# Patient Record
Sex: Male | Born: 1965
Health system: Southern US, Community
[De-identification: ages and names within clinical notes are randomized; demographics above are authoritative.]

## PROBLEM LIST (undated history)

## (undated) DIAGNOSIS — I700 Atherosclerosis of aorta: Secondary | ICD-10-CM

## (undated) DIAGNOSIS — J449 Chronic obstructive pulmonary disease, unspecified: Secondary | ICD-10-CM

## (undated) DIAGNOSIS — F411 Generalized anxiety disorder: Secondary | ICD-10-CM

## (undated) DIAGNOSIS — I2510 Atherosclerotic heart disease of native coronary artery without angina pectoris: Secondary | ICD-10-CM

## (undated) DIAGNOSIS — I209 Angina pectoris, unspecified: Secondary | ICD-10-CM

## (undated) DIAGNOSIS — I219 Acute myocardial infarction, unspecified: Secondary | ICD-10-CM

## (undated) DIAGNOSIS — E782 Mixed hyperlipidemia: Secondary | ICD-10-CM

## (undated) DIAGNOSIS — I10 Essential (primary) hypertension: Secondary | ICD-10-CM

## (undated) DIAGNOSIS — K219 Gastro-esophageal reflux disease without esophagitis: Secondary | ICD-10-CM

## (undated) DIAGNOSIS — Z720 Tobacco use: Secondary | ICD-10-CM

## (undated) DIAGNOSIS — I214 Non-ST elevation (NSTEMI) myocardial infarction: Secondary | ICD-10-CM

## (undated) HISTORY — DX: Tobacco use: Z72.0

## (undated) HISTORY — DX: Atherosclerosis of aorta: I70.0

## (undated) HISTORY — PX: HERNIA REPAIR: SHX51

## (undated) HISTORY — DX: Generalized anxiety disorder: F41.1

## (undated) HISTORY — DX: Mixed hyperlipidemia: E78.2

## (undated) HISTORY — DX: Non-ST elevation (NSTEMI) myocardial infarction: I21.4

---

## 2016-11-16 DIAGNOSIS — R0902 Hypoxemia: Secondary | ICD-10-CM | POA: Diagnosis not present

## 2016-12-17 DIAGNOSIS — R0902 Hypoxemia: Secondary | ICD-10-CM | POA: Diagnosis not present

## 2017-01-14 DIAGNOSIS — R0902 Hypoxemia: Secondary | ICD-10-CM | POA: Diagnosis not present

## 2017-02-14 DIAGNOSIS — R0902 Hypoxemia: Secondary | ICD-10-CM | POA: Diagnosis not present

## 2017-03-16 DIAGNOSIS — R0902 Hypoxemia: Secondary | ICD-10-CM | POA: Diagnosis not present

## 2017-04-16 DIAGNOSIS — R0902 Hypoxemia: Secondary | ICD-10-CM | POA: Diagnosis not present

## 2017-05-16 DIAGNOSIS — R0902 Hypoxemia: Secondary | ICD-10-CM | POA: Diagnosis not present

## 2017-06-16 DIAGNOSIS — R0902 Hypoxemia: Secondary | ICD-10-CM | POA: Diagnosis not present

## 2017-07-17 DIAGNOSIS — R0902 Hypoxemia: Secondary | ICD-10-CM | POA: Diagnosis not present

## 2017-08-16 DIAGNOSIS — R0902 Hypoxemia: Secondary | ICD-10-CM | POA: Diagnosis not present

## 2017-08-18 DIAGNOSIS — Z23 Encounter for immunization: Secondary | ICD-10-CM | POA: Diagnosis not present

## 2017-08-18 DIAGNOSIS — Z0000 Encounter for general adult medical examination without abnormal findings: Secondary | ICD-10-CM | POA: Diagnosis not present

## 2017-09-16 DIAGNOSIS — R0902 Hypoxemia: Secondary | ICD-10-CM | POA: Diagnosis not present

## 2017-10-16 DIAGNOSIS — R0902 Hypoxemia: Secondary | ICD-10-CM | POA: Diagnosis not present

## 2017-11-16 DIAGNOSIS — R0902 Hypoxemia: Secondary | ICD-10-CM | POA: Diagnosis not present

## 2017-12-16 DIAGNOSIS — J441 Chronic obstructive pulmonary disease with (acute) exacerbation: Secondary | ICD-10-CM | POA: Diagnosis not present

## 2017-12-17 DIAGNOSIS — R0902 Hypoxemia: Secondary | ICD-10-CM | POA: Diagnosis not present

## 2018-01-14 DIAGNOSIS — R0902 Hypoxemia: Secondary | ICD-10-CM | POA: Diagnosis not present

## 2018-02-14 DIAGNOSIS — R0902 Hypoxemia: Secondary | ICD-10-CM | POA: Diagnosis not present

## 2018-03-16 DIAGNOSIS — R0902 Hypoxemia: Secondary | ICD-10-CM | POA: Diagnosis not present

## 2018-04-16 DIAGNOSIS — R0902 Hypoxemia: Secondary | ICD-10-CM | POA: Diagnosis not present

## 2018-05-16 DIAGNOSIS — R0902 Hypoxemia: Secondary | ICD-10-CM | POA: Diagnosis not present

## 2018-06-16 DIAGNOSIS — R0902 Hypoxemia: Secondary | ICD-10-CM | POA: Diagnosis not present

## 2018-07-11 DIAGNOSIS — Z125 Encounter for screening for malignant neoplasm of prostate: Secondary | ICD-10-CM | POA: Diagnosis not present

## 2018-07-11 DIAGNOSIS — Z6832 Body mass index (BMI) 32.0-32.9, adult: Secondary | ICD-10-CM | POA: Diagnosis not present

## 2018-07-11 DIAGNOSIS — Z0000 Encounter for general adult medical examination without abnormal findings: Secondary | ICD-10-CM | POA: Diagnosis not present

## 2018-07-17 DIAGNOSIS — R0902 Hypoxemia: Secondary | ICD-10-CM | POA: Diagnosis not present

## 2018-08-16 DIAGNOSIS — R0902 Hypoxemia: Secondary | ICD-10-CM | POA: Diagnosis not present

## 2018-09-16 DIAGNOSIS — R0902 Hypoxemia: Secondary | ICD-10-CM | POA: Diagnosis not present

## 2018-10-16 DIAGNOSIS — R0902 Hypoxemia: Secondary | ICD-10-CM | POA: Diagnosis not present

## 2018-11-04 DIAGNOSIS — J441 Chronic obstructive pulmonary disease with (acute) exacerbation: Secondary | ICD-10-CM | POA: Diagnosis not present

## 2018-11-16 DIAGNOSIS — R0902 Hypoxemia: Secondary | ICD-10-CM | POA: Diagnosis not present

## 2018-12-17 DIAGNOSIS — R0902 Hypoxemia: Secondary | ICD-10-CM | POA: Diagnosis not present

## 2019-01-15 DIAGNOSIS — R0902 Hypoxemia: Secondary | ICD-10-CM | POA: Diagnosis not present

## 2019-02-15 DIAGNOSIS — R0902 Hypoxemia: Secondary | ICD-10-CM | POA: Diagnosis not present

## 2019-03-17 DIAGNOSIS — R0902 Hypoxemia: Secondary | ICD-10-CM | POA: Diagnosis not present

## 2020-02-13 ENCOUNTER — Other Ambulatory Visit: Payer: Self-pay

## 2020-02-13 MED ORDER — MONTELUKAST SODIUM 10 MG PO TABS: 10.0000 mg | ORAL_TABLET | Freq: Every day | ORAL | 0 refills | Status: DC

## 2020-02-28 ENCOUNTER — Inpatient Hospital Stay (HOSPITAL_COMMUNITY)
Admission: AD | Admit: 2020-02-28 | Discharge: 2020-03-14 | DRG: 233 | Disposition: A | Discharge status: Home / Self Care | Payer: BC Managed Care – PPO | Source: Other Acute Inpatient Hospital | Attending: Thoracic Surgery (Cardiothoracic Vascular Surgery) | Admitting: Thoracic Surgery (Cardiothoracic Vascular Surgery)

## 2020-02-28 ENCOUNTER — Other Ambulatory Visit: Payer: Self-pay

## 2020-02-28 ENCOUNTER — Inpatient Hospital Stay (HOSPITAL_COMMUNITY): Payer: BC Managed Care – PPO

## 2020-02-28 ENCOUNTER — Encounter (HOSPITAL_COMMUNITY): Payer: Self-pay | Admitting: Internal Medicine

## 2020-02-28 DIAGNOSIS — Z79899 Other long term (current) drug therapy: Secondary | ICD-10-CM

## 2020-02-28 DIAGNOSIS — Z09 Encounter for follow-up examination after completed treatment for conditions other than malignant neoplasm: Secondary | ICD-10-CM

## 2020-02-28 DIAGNOSIS — E8770 Fluid overload, unspecified: Secondary | ICD-10-CM | POA: Diagnosis not present

## 2020-02-28 DIAGNOSIS — Z01810 Encounter for preprocedural cardiovascular examination: Secondary | ICD-10-CM

## 2020-02-28 DIAGNOSIS — J939 Pneumothorax, unspecified: Secondary | ICD-10-CM

## 2020-02-28 DIAGNOSIS — Y838 Other surgical procedures as the cause of abnormal reaction of the patient, or of later complication, without mention of misadventure at the time of the procedure: Secondary | ICD-10-CM | POA: Diagnosis not present

## 2020-02-28 DIAGNOSIS — Z951 Presence of aortocoronary bypass graft: Secondary | ICD-10-CM

## 2020-02-28 DIAGNOSIS — Z720 Tobacco use: Secondary | ICD-10-CM | POA: Diagnosis not present

## 2020-02-28 DIAGNOSIS — J95812 Postprocedural air leak: Secondary | ICD-10-CM | POA: Diagnosis not present

## 2020-02-28 DIAGNOSIS — J9382 Other air leak: Secondary | ICD-10-CM | POA: Diagnosis not present

## 2020-02-28 DIAGNOSIS — J9811 Atelectasis: Secondary | ICD-10-CM | POA: Diagnosis not present

## 2020-02-28 DIAGNOSIS — T380X5A Adverse effect of glucocorticoids and synthetic analogues, initial encounter: Secondary | ICD-10-CM | POA: Diagnosis not present

## 2020-02-28 DIAGNOSIS — J439 Emphysema, unspecified: Secondary | ICD-10-CM | POA: Diagnosis present

## 2020-02-28 DIAGNOSIS — I214 Non-ST elevation (NSTEMI) myocardial infarction: Secondary | ICD-10-CM | POA: Diagnosis not present

## 2020-02-28 DIAGNOSIS — Z20822 Contact with and (suspected) exposure to covid-19: Secondary | ICD-10-CM | POA: Diagnosis present

## 2020-02-28 DIAGNOSIS — J9620 Acute and chronic respiratory failure, unspecified whether with hypoxia or hypercapnia: Secondary | ICD-10-CM | POA: Diagnosis not present

## 2020-02-28 DIAGNOSIS — I2510 Atherosclerotic heart disease of native coronary artery without angina pectoris: Secondary | ICD-10-CM | POA: Diagnosis present

## 2020-02-28 DIAGNOSIS — Z8249 Family history of ischemic heart disease and other diseases of the circulatory system: Secondary | ICD-10-CM

## 2020-02-28 DIAGNOSIS — K219 Gastro-esophageal reflux disease without esophagitis: Secondary | ICD-10-CM | POA: Diagnosis present

## 2020-02-28 DIAGNOSIS — I25110 Atherosclerotic heart disease of native coronary artery with unstable angina pectoris: Secondary | ICD-10-CM | POA: Diagnosis not present

## 2020-02-28 DIAGNOSIS — R079 Chest pain, unspecified: Secondary | ICD-10-CM | POA: Diagnosis present

## 2020-02-28 DIAGNOSIS — D72829 Elevated white blood cell count, unspecified: Secondary | ICD-10-CM | POA: Diagnosis not present

## 2020-02-28 DIAGNOSIS — F17200 Nicotine dependence, unspecified, uncomplicated: Secondary | ICD-10-CM | POA: Diagnosis present

## 2020-02-28 DIAGNOSIS — E781 Pure hyperglyceridemia: Secondary | ICD-10-CM | POA: Diagnosis present

## 2020-02-28 DIAGNOSIS — Z9689 Presence of other specified functional implants: Secondary | ICD-10-CM

## 2020-02-28 HISTORY — DX: Chest pain, unspecified: R07.9

## 2020-02-28 HISTORY — DX: Gastro-esophageal reflux disease without esophagitis: K21.9

## 2020-02-28 LAB — CBC WITH DIFFERENTIAL/PLATELET
Abs Immature Granulocytes: 0.06 10*3/uL (ref 0.00–0.07)
Basophils Absolute: 0.1 10*3/uL (ref 0.0–0.1)
Basophils Relative: 1 %
Eosinophils Absolute: 0.2 10*3/uL (ref 0.0–0.5)
Eosinophils Relative: 2 %
HCT: 49.5 % (ref 39.0–52.0)
Hemoglobin: 17.4 g/dL — ABNORMAL HIGH (ref 13.0–17.0)
Immature Granulocytes: 1 %
Lymphocytes Relative: 37 %
Lymphs Abs: 4.3 10*3/uL — ABNORMAL HIGH (ref 0.7–4.0)
MCH: 32.2 pg (ref 26.0–34.0)
MCHC: 35.2 g/dL (ref 30.0–36.0)
MCV: 91.5 fL (ref 80.0–100.0)
Monocytes Absolute: 0.8 10*3/uL (ref 0.1–1.0)
Monocytes Relative: 7 %
Neutro Abs: 6.2 10*3/uL (ref 1.7–7.7)
Neutrophils Relative %: 52 %
Platelets: 221 10*3/uL (ref 150–400)
RBC: 5.41 MIL/uL (ref 4.22–5.81)
RDW: 13.6 % (ref 11.5–15.5)
WBC: 11.6 10*3/uL — ABNORMAL HIGH (ref 4.0–10.5)
nRBC: 0 % (ref 0.0–0.2)

## 2020-02-28 LAB — COMPREHENSIVE METABOLIC PANEL
ALT: 47 U/L — ABNORMAL HIGH (ref 0–44)
AST: 46 U/L — ABNORMAL HIGH (ref 15–41)
Albumin: 3.7 g/dL (ref 3.5–5.0)
Alkaline Phosphatase: 67 U/L (ref 38–126)
Anion gap: 13 (ref 5–15)
BUN: 12 mg/dL (ref 6–20)
CO2: 21 mmol/L — ABNORMAL LOW (ref 22–32)
Calcium: 9.3 mg/dL (ref 8.9–10.3)
Chloride: 104 mmol/L (ref 98–111)
Creatinine, Ser: 0.92 mg/dL (ref 0.61–1.24)
GFR calc Af Amer: >60 mL/min (ref >60)
GFR calc non Af Amer: >60 mL/min (ref >60)
Glucose, Bld: 103 mg/dL — ABNORMAL HIGH (ref 70–99)
Potassium: 4.3 mmol/L (ref 3.5–5.1)
Sodium: 138 mmol/L (ref 135–145)
Total Bilirubin: 0.5 mg/dL (ref 0.3–1.2)
Total Protein: 7.3 g/dL (ref 6.5–8.1)

## 2020-02-28 LAB — LIPASE, BLOOD: Lipase: 27 U/L (ref 11–51)

## 2020-02-28 LAB — HIV ANTIBODY (ROUTINE TESTING W REFLEX): HIV Screen 4th Generation wRfx: NONREACTIVE

## 2020-02-28 LAB — TROPONIN I (HIGH SENSITIVITY): Troponin I (High Sensitivity): 4332 ng/L (ref <18)

## 2020-02-28 MED ORDER — ONDANSETRON HCL 4 MG/2ML IJ SOLN: 4.0000 mg | Freq: Four times a day (QID) | INTRAMUSCULAR | Status: DC | PRN

## 2020-02-28 MED ORDER — ONDANSETRON HCL 4 MG PO TABS: 4.0000 mg | ORAL_TABLET | Freq: Four times a day (QID) | ORAL | Status: DC | PRN

## 2020-02-28 MED ORDER — ASPIRIN EC 81 MG PO TBEC
81.0000 mg | DELAYED_RELEASE_TABLET | Freq: Every day | ORAL | Status: DC
  Administered 2020-02-28 – 2020-02-29 (×2): 81 mg via ORAL
  Filled 2020-02-28 (×2): qty 1

## 2020-02-28 MED ORDER — MORPHINE SULFATE (PF) 2 MG/ML IV SOLN: 1.0000 mg | INTRAVENOUS | Status: DC | PRN

## 2020-02-28 MED ORDER — HEPARIN (PORCINE) 25000 UT/250ML-% IV SOLN
1300.0000 [IU]/h | INTRAVENOUS | Status: DC
  Administered 2020-02-28: 21:00:00 1000 [IU]/h via INTRAVENOUS
  Administered 2020-02-29: 1300 [IU]/h via INTRAVENOUS
  Filled 2020-02-28 (×2): qty 250

## 2020-02-28 MED ORDER — NITROGLYCERIN 0.4 MG SL SUBL: 0.4000 mg | SUBLINGUAL_TABLET | SUBLINGUAL | Status: DC | PRN

## 2020-02-28 NOTE — Progress Notes (Signed)
ANTICOAGULATION CONSULT NOTE - Initial Consult  Pharmacy Consult for heparin Indication: chest pain/ACS  Allergies  Allergen Reactions  . Codeine Nausea And Vomiting    Patient Measurements: Height: 5\' 7"  (170.2 cm) Weight: 92.7 kg (204 lb 4.8 oz)(scale B) IBW/kg (Calculated) : 66.1 Heparin Dosing Weight: 85kg  Vital Signs: Temp: 98.7 F (37.1 C) (04/14 1954) Temp Source: Oral (04/14 1954) BP: 141/104 (04/14 1954) Pulse Rate: 88 (04/14 1954)  Labs: No results for input(s): HGB, HCT, PLT, APTT, LABPROT, INR, HEPARINUNFRC, HEPRLOWMOCWT, CREATININE, CKTOTAL, CKMB, TROPONINIHS in the last 72 hours.  CrCl cannot be calculated (No successful lab value found.).   Medical History: Past Medical History:  Diagnosis Date  . GERD (gastroesophageal reflux disease)     Medications:  Medications Prior to Admission  Medication Sig Dispense Refill Last Dose  . albuterol (VENTOLIN HFA) 108 (90 Base) MCG/ACT inhaler Inhale 1-2 puffs into the lungs every 4 (four) hours as needed for wheezing or shortness of breath.   02/28/2020 at Unknown time  . montelukast (SINGULAIR) 10 MG tablet Take 1 tablet (10 mg total) by mouth at bedtime. (Patient taking differently: Take 10 mg by mouth daily. ) 90 tablet 0 02/28/2020 at Unknown time   Scheduled:  . aspirin EC  81 mg Oral Daily    Assessment: 54 yo male transferred from [facility] Pharmacy to dose heparin for r/o ACS. No anticoagulants noted PTA -heparin was started at [facility]; currently infusing at 1000 units/hr (~ 12 units/kg/hr)   Goal of Therapy:  Heparin level 0.3-0.7 units/ml Monitor platelets by anticoagulation protocol: Yes   Plan:  -Continue heparin at 1000 units/hr -Check heparin level at 11pm -Daily heparin level and CBC  [facility], PharmD Clinical Pharmacist **Pharmacist phone directory can now be found on [email or web address] ([email or web address]).  Listed under [facility].

## 2020-02-28 NOTE — Progress Notes (Signed)
   02/28/20 2145  Provider Notification  Provider Name/Title Dr. Toniann Fail  Date Provider Notified 02/28/20  Time Provider Notified 2145  Notification Type Page  Notification Reason Other (Comment) (Troponin HS=4,332)  Response Other (Comment) (MD at bedside)  Date of Provider Response 02/28/20  Time of Provider Response 2147  Patient asymptomatic, no chest pain at this time. Provider at bedside.

## 2020-02-28 NOTE — H&P (Signed)
History and Physical    Sean Joseph YTK:[id number] DOB: 03/08/1966 DOA: 02/28/2020  PCP: Darrol Jump, PA-C   Patient coming from: Patient was transferred from [facility]  Chief Complaint: Chest pain.  HPI: Sean Joseph is a 54 y.o. male with history of GERD tobacco abuse and family history of CAD has been having chest pain off and on for last few weeks.  Today the patient's chest pain become more persistent and was radiating to the left arm.  This was more than usual and patient present to the ER at [facility].  Over there patient stated a chest pain of 10 out of 10 which completely resolved after patient was given IV morphine.  Cardiology over there was consulted and patient was advised to be transferred for possible cardiac cath since patient chest pain is concerning.  Labs including troponins were unremarkable.  EKG was showing sinus rhythm.  Patient was started on heparin.  On exam patient is presently chest pain-free.  Patient's cardiac markers were done which shows high sensitive troponin of 4300 with EKG showing normal sinus rhythm with a chest x-ray unremarkable.  Cardiology was notified.  Patient is on heparin and aspirin if blood pressure allows will start beta-blockers and statins.  Covid test is pending.   ED Course: Patient is a direct admit.  Review of Systems: As per HPI, rest all negative.   Past Medical History:  Diagnosis Date  . GERD (gastroesophageal reflux disease)     Past Surgical History:  Procedure Laterality Date  . HERNIA REPAIR       reports that he has been smoking. He has never used smokeless tobacco. He reports current alcohol use. He reports that he does not use drugs.  Not on File  Family History  Problem Relation Age of Onset  . CAD Mother   . CAD Father     Prior to Admission medications   Medication Sig Start Date End Date Taking? Authorizing Provider  montelukast (SINGULAIR) 10 MG tablet Take 1 tablet (10 mg  total) by mouth at bedtime. 02/13/20   Rochel Brome, MD    Physical Exam: Constitutional: Moderately built and nourished. Vitals:   02/28/20 1941 02/28/20 1954  BP:  (!) 141/104  Pulse:  88  Resp:  18  Temp:  98.7 F (37.1 C)  TempSrc:  Oral  SpO2:  94%  Weight: 92.7 kg   Height: 5\' 7"  (1.702 m)    Eyes: Anicteric no pallor. ENMT: No discharge from the ears eyes nose or mouth. Neck: No mass felt.  No neck rigidity. Respiratory: No rhonchi or crepitations. Cardiovascular: S1-S2 heard. Abdomen: Soft nontender bowel sound present. Musculoskeletal: No edema. Skin: No rash. Neurologic: Alert awake oriented to time place and person.  Moves all extremities. Psychiatric: Appears normal per normal affect.   Labs on Admission: I have personally reviewed following labs and imaging studies  CBC: No results for input(s): WBC, NEUTROABS, HGB, HCT, MCV, PLT in the last 168 hours. Basic Metabolic Panel: No results for input(s): NA, K, CL, CO2, GLUCOSE, BUN, CREATININE, CALCIUM, MG, PHOS in the last 168 hours. GFR: CrCl cannot be calculated (No successful lab value found.). Liver Function Tests: No results for input(s): AST, ALT, ALKPHOS, BILITOT, PROT, ALBUMIN in the last 168 hours. No results for input(s): LIPASE, AMYLASE in the last 168 hours. No results for input(s): AMMONIA in the last 168 hours. Coagulation Profile: No results for input(s): INR, PROTIME in the last 168 hours. Cardiac Enzymes: No results  for input(s): CKTOTAL, CKMB, CKMBINDEX, TROPONINI in the last 168 hours. BNP (last 3 results) No results for input(s): PROBNP in the last 8760 hours. HbA1C: No results for input(s): HGBA1C in the last 72 hours. CBG: No results for input(s): GLUCAP in the last 168 hours. Lipid Profile: No results for input(s): CHOL, HDL, LDLCALC, TRIG, CHOLHDL, LDLDIRECT in the last 72 hours. Thyroid Function Tests: No results for input(s): TSH, T4TOTAL, FREET4, T3FREE, THYROIDAB in the last  72 hours. Anemia Panel: No results for input(s): VITAMINB12, FOLATE, FERRITIN, TIBC, IRON, RETICCTPCT in the last 72 hours. Urine analysis: No results found for: COLORURINE, APPEARANCEUR, LABSPEC, PHURINE, GLUCOSEU, HGBUR, BILIRUBINUR, KETONESUR, PROTEINUR, UROBILINOGEN, NITRITE, LEUKOCYTESUR Sepsis Labs: @LABRCNTIP (procalcitonin:4,lacticidven:4) )No results found for this or any previous visit (from the past 240 hour(s)).   Radiological Exams on Admission: No results found.  EKG: Independently reviewed.  Normal sinus rhythm with poor R wave progression and Q waves in inferior leads.  Assessment/Plan Principal Problem:   Chest pain    1. Non-ST elevation MI -discussed with cardiologist.  Patient is presently on aspirin heparin will start statins and if blood pressure allows will start beta-blockers.  Kept n.p.o. in anticipation of cardiac cath. 2. Tobacco abuse -advised about quitting. 3. History of GERD with multiple surgeries on PPI.  Since patient has non-ST elevation MI will need close monitoring for any further deterioration in inpatient status.   DVT prophylaxis: Heparin infusion. Code Status: Full code. Family Communication: Discussed with patient. Disposition Plan: Home. Consults called: Cardiology. Admission status: Inpatient.   MD [facility] Pager [phone].  If 7PM-7AM, please contact night-coverage [email or web address] Password [facility]  02/28/2020, 8:28 PM

## 2020-02-28 NOTE — Progress Notes (Signed)
   02/28/20 1954  Vitals  Temp 98.7 F (37.1 C)  Temp Source Oral  BP (!) 141/104  MAP (mmHg) 116  BP Location Left Arm  BP Method Automatic  Patient Position (if appropriate) Sitting  Pulse Rate 88  Pulse Rate Source Monitor  Resp 18  Oxygen Therapy  SpO2 94 %  O2 Device Room Air  MEWS Score  MEWS Temp 0  MEWS Systolic 0  MEWS Pulse 0  MEWS RR 0  MEWS LOC 0  MEWS Score 0  MEWS Score Color Green  Admitted pt to rm 3E29 from [place], pt is alert and oriented, denies chest pain at tis time, oriented to room, call bell placed within reach. Pt on heparin drip @10cc /hr. Placed on cardiac monitor, CCMD made aware. Dr. notified.

## 2020-02-28 NOTE — Consult Note (Signed)
Cardiology Consult Note  Reason for consult: chest pain  HPI Sean Joseph is a 54 y.o. male with history of COPD, tobacco use, no cardiac history who presents with chest pain.  Patient was playing with his grandchildren when he had acute onset of midsternal chest pain rating to his arm.  Pain persisted until he received nitroglycerin in the [facility] ED.  He has had chest pain in the past but this was worse than prior episodes.  Troponins were reportedly negative at [facility] so he was transferred here for further evaluation.  On arrival here no chest pain.  Initial troponin 4332.  Remainder of labs unremarkable.  EKG here pending, but no ST elevation on OSH ECG.  ASSESSMENT/PLAN Acute onset chest pain with significantly elevated troponin.  No prior cardiac history.  Suspect type I NSTEMI and would start IV heparin and plan for left heart cath tomorrow.  He is currently asymptomatic. -IV heparin for ACS -N.p.o. for left heart cath tomorrow -Echocardiogram ordered

## 2020-02-29 ENCOUNTER — Inpatient Hospital Stay (HOSPITAL_COMMUNITY): Payer: BC Managed Care – PPO

## 2020-02-29 ENCOUNTER — Encounter (HOSPITAL_COMMUNITY)
Admission: AD | Disposition: A | Discharge status: Home / Self Care | Payer: Self-pay | Source: Other Acute Inpatient Hospital | Attending: Thoracic Surgery (Cardiothoracic Vascular Surgery)

## 2020-02-29 DIAGNOSIS — R079 Chest pain, unspecified: Secondary | ICD-10-CM | POA: Diagnosis not present

## 2020-02-29 DIAGNOSIS — Z720 Tobacco use: Secondary | ICD-10-CM

## 2020-02-29 DIAGNOSIS — I214 Non-ST elevation (NSTEMI) myocardial infarction: Secondary | ICD-10-CM

## 2020-02-29 DIAGNOSIS — I2510 Atherosclerotic heart disease of native coronary artery without angina pectoris: Secondary | ICD-10-CM | POA: Diagnosis not present

## 2020-02-29 DIAGNOSIS — Z01810 Encounter for preprocedural cardiovascular examination: Secondary | ICD-10-CM

## 2020-02-29 DIAGNOSIS — I25110 Atherosclerotic heart disease of native coronary artery with unstable angina pectoris: Secondary | ICD-10-CM

## 2020-02-29 HISTORY — PX: LEFT HEART CATH AND CORONARY ANGIOGRAPHY: CATH118249

## 2020-02-29 LAB — URINALYSIS, ROUTINE W REFLEX MICROSCOPIC
Bilirubin Urine: NEGATIVE
Glucose, UA: NEGATIVE mg/dL
Hgb urine dipstick: NEGATIVE
Ketones, ur: NEGATIVE mg/dL
Leukocytes,Ua: NEGATIVE
Nitrite: NEGATIVE
Protein, ur: NEGATIVE mg/dL
Specific Gravity, Urine: 1.021 (ref 1.005–1.030)
pH: 7 (ref 5.0–8.0)

## 2020-02-29 LAB — BLOOD GAS, ARTERIAL
Acid-Base Excess: 0.7 mmol/L (ref 0.0–2.0)
Bicarbonate: 24.9 mmol/L (ref 20.0–28.0)
FIO2: 21
O2 Saturation: 92.1 %
Patient temperature: 36.6
pCO2 arterial: 39.7 mmHg (ref 32.0–48.0)
pH, Arterial: 7.411 (ref 7.350–7.450)
pO2, Arterial: 63.2 mmHg — ABNORMAL LOW (ref 83.0–108.0)

## 2020-02-29 LAB — HEPARIN LEVEL (UNFRACTIONATED)
Heparin Unfractionated: 0.17 IU/mL — ABNORMAL LOW (ref 0.30–0.70)
Heparin Unfractionated: 0.4 IU/mL (ref 0.30–0.70)

## 2020-02-29 LAB — BASIC METABOLIC PANEL
Anion gap: 10 (ref 5–15)
BUN: 11 mg/dL (ref 6–20)
CO2: 25 mmol/L (ref 22–32)
Calcium: 9.3 mg/dL (ref 8.9–10.3)
Chloride: 103 mmol/L (ref 98–111)
Creatinine, Ser: 0.97 mg/dL (ref 0.61–1.24)
GFR calc Af Amer: >60 mL/min (ref >60)
GFR calc non Af Amer: >60 mL/min (ref >60)
Glucose, Bld: 106 mg/dL — ABNORMAL HIGH (ref 70–99)
Potassium: 4.2 mmol/L (ref 3.5–5.1)
Sodium: 138 mmol/L (ref 135–145)

## 2020-02-29 LAB — ECHOCARDIOGRAM COMPLETE
Height: 67 in
Weight: 3224.01 oz

## 2020-02-29 LAB — CBC
HCT: 52.7 % — ABNORMAL HIGH (ref 39.0–52.0)
Hemoglobin: 17.9 g/dL — ABNORMAL HIGH (ref 13.0–17.0)
MCH: 31.7 pg (ref 26.0–34.0)
MCHC: 34 g/dL (ref 30.0–36.0)
MCV: 93.4 fL (ref 80.0–100.0)
Platelets: 228 10*3/uL (ref 150–400)
RBC: 5.64 MIL/uL (ref 4.22–5.81)
RDW: 14 % (ref 11.5–15.5)
WBC: 11.6 10*3/uL — ABNORMAL HIGH (ref 4.0–10.5)
nRBC: 0 % (ref 0.0–0.2)

## 2020-02-29 LAB — TYPE AND SCREEN
ABO/RH(D): O POS
Antibody Screen: NEGATIVE

## 2020-02-29 LAB — LIPID PANEL
Cholesterol: 222 mg/dL — ABNORMAL HIGH (ref 0–200)
HDL: 32 mg/dL — ABNORMAL LOW (ref >40)
LDL Cholesterol: 144 mg/dL — ABNORMAL HIGH (ref 0–99)
Total CHOL/HDL Ratio: 6.9 RATIO
Triglycerides: 231 mg/dL — ABNORMAL HIGH (ref <150)
VLDL: 46 mg/dL — ABNORMAL HIGH (ref 0–40)

## 2020-02-29 LAB — HEMOGLOBIN A1C
Hgb A1c MFr Bld: 6 % — ABNORMAL HIGH (ref 4.8–5.6)
Mean Plasma Glucose: 125.5 mg/dL

## 2020-02-29 LAB — SURGICAL PCR SCREEN
MRSA, PCR: NEGATIVE
Staphylococcus aureus: NEGATIVE

## 2020-02-29 LAB — TROPONIN I (HIGH SENSITIVITY)
Troponin I (High Sensitivity): 5969 ng/L (ref <18)
Troponin I (High Sensitivity): 7024 ng/L (ref <18)
Troponin I (High Sensitivity): 5861 ng/L (ref <18)

## 2020-02-29 LAB — ABO/RH: ABO/RH(D): O POS

## 2020-02-29 LAB — SARS CORONAVIRUS 2 (TAT 6-24 HRS): SARS Coronavirus 2: NEGATIVE

## 2020-02-29 LAB — TSH: TSH: 1.549 u[IU]/mL (ref 0.350–4.500)

## 2020-02-29 SURGERY — LEFT HEART CATH AND CORONARY ANGIOGRAPHY
Anesthesia: LOCAL

## 2020-02-29 MED ORDER — ACETAMINOPHEN 325 MG PO TABS
650.0000 mg | ORAL_TABLET | Freq: Three times a day (TID) | ORAL | Status: DC | PRN
  Administered 2020-02-29: 10:00:00 650 mg via ORAL
  Filled 2020-02-29: qty 2

## 2020-02-29 MED ORDER — HEPARIN BOLUS VIA INFUSION
2500.0000 [IU] | Freq: Once | INTRAVENOUS | Status: AC
  Administered 2020-02-29: 2500 [IU] via INTRAVENOUS
  Filled 2020-02-29: qty 2500

## 2020-02-29 MED ORDER — SODIUM CHLORIDE 0.9% FLUSH: 3.0000 mL | INTRAVENOUS | Status: DC | PRN

## 2020-02-29 MED ORDER — HEPARIN (PORCINE) IN NACL 1000-0.9 UT/500ML-% IV SOLN
INTRAVENOUS | Status: AC
  Filled 2020-02-29: qty 1000

## 2020-02-29 MED ORDER — EPINEPHRINE HCL 5 MG/250ML IV SOLN IN NS
0.0000 ug/min | INTRAVENOUS | Status: DC
  Filled 2020-02-29: qty 250

## 2020-02-29 MED ORDER — INSULIN REGULAR(HUMAN) IN NACL 100-0.9 UT/100ML-% IV SOLN
INTRAVENOUS | Status: AC
  Administered 2020-03-01: 10:00:00 1.3 [IU]/h via INTRAVENOUS
  Filled 2020-02-29: qty 100

## 2020-02-29 MED ORDER — DEXMEDETOMIDINE HCL IN NACL 400 MCG/100ML IV SOLN
0.1000 ug/kg/h | INTRAVENOUS | Status: AC
  Administered 2020-03-01: 11:00:00 .5 ug/kg/h via INTRAVENOUS
  Filled 2020-02-29: qty 100

## 2020-02-29 MED ORDER — NITROGLYCERIN 1 MG/10 ML FOR IR/CATH LAB
INTRA_ARTERIAL | Status: DC | PRN
  Administered 2020-02-29: 200 ug via INTRACORONARY

## 2020-02-29 MED ORDER — FENTANYL CITRATE (PF) 100 MCG/2ML IJ SOLN
INTRAMUSCULAR | Status: AC
  Filled 2020-02-29: qty 2

## 2020-02-29 MED ORDER — LABETALOL HCL 5 MG/ML IV SOLN: 10.0000 mg | INTRAVENOUS | Status: DC | PRN

## 2020-02-29 MED ORDER — ATORVASTATIN CALCIUM 80 MG PO TABS
80.0000 mg | ORAL_TABLET | Freq: Every day | ORAL | Status: DC
  Administered 2020-03-03 – 2020-03-13 (×11): 80 mg via ORAL
  Filled 2020-02-29 (×11): qty 1

## 2020-02-29 MED ORDER — TRANEXAMIC ACID 1000 MG/10ML IV SOLN
1.5000 mg/kg/h | INTRAVENOUS | Status: AC
  Administered 2020-03-01: 09:00:00 1.5 mg/kg/h via INTRAVENOUS
  Filled 2020-02-29: qty 25

## 2020-02-29 MED ORDER — SODIUM CHLORIDE 0.9 % IV SOLN
1.5000 g | INTRAVENOUS | Status: AC
  Administered 2020-03-01: 08:00:00 1.5 g via INTRAVENOUS
  Filled 2020-02-29: qty 1.5

## 2020-02-29 MED ORDER — MIDAZOLAM HCL 2 MG/2ML IJ SOLN
INTRAMUSCULAR | Status: AC
  Filled 2020-02-29: qty 2

## 2020-02-29 MED ORDER — HYDRALAZINE HCL 20 MG/ML IJ SOLN: 10.0000 mg | INTRAMUSCULAR | Status: DC | PRN

## 2020-02-29 MED ORDER — NITROGLYCERIN 1 MG/10 ML FOR IR/CATH LAB
INTRA_ARTERIAL | Status: AC
  Filled 2020-02-29: qty 10

## 2020-02-29 MED ORDER — MIDAZOLAM HCL 2 MG/2ML IJ SOLN
INTRAMUSCULAR | Status: DC | PRN
  Administered 2020-02-29: 1 mg via INTRAVENOUS

## 2020-02-29 MED ORDER — BISACODYL 5 MG PO TBEC
5.0000 mg | DELAYED_RELEASE_TABLET | Freq: Once | ORAL | Status: AC
  Administered 2020-02-29: 20:00:00 5 mg via ORAL
  Filled 2020-02-29: qty 1

## 2020-02-29 MED ORDER — ONDANSETRON HCL 4 MG/2ML IJ SOLN: 4.0000 mg | Freq: Four times a day (QID) | INTRAMUSCULAR | Status: DC | PRN

## 2020-02-29 MED ORDER — VANCOMYCIN HCL 1500 MG/300ML IV SOLN
1500.0000 mg | INTRAVENOUS | Status: AC
  Administered 2020-03-01: 08:00:00 1500 mg via INTRAVENOUS
  Filled 2020-02-29: qty 300

## 2020-02-29 MED ORDER — ATORVASTATIN CALCIUM 80 MG PO TABS
80.0000 mg | ORAL_TABLET | Freq: Every day | ORAL | Status: DC
  Administered 2020-02-29: 80 mg via ORAL
  Filled 2020-02-29: qty 1

## 2020-02-29 MED ORDER — VERAPAMIL HCL 2.5 MG/ML IV SOLN
INTRAVENOUS | Status: AC
  Filled 2020-02-29: qty 2

## 2020-02-29 MED ORDER — IOHEXOL 350 MG/ML SOLN
INTRAVENOUS | Status: DC | PRN
  Administered 2020-02-29: 15:00:00 120 mL

## 2020-02-29 MED ORDER — DIAZEPAM 2 MG PO TABS
2.0000 mg | ORAL_TABLET | Freq: Once | ORAL | Status: AC
  Administered 2020-03-01: 2 mg via ORAL
  Filled 2020-02-29: qty 1

## 2020-02-29 MED ORDER — NITROGLYCERIN IN D5W 200-5 MCG/ML-% IV SOLN
2.0000 ug/min | INTRAVENOUS | Status: DC
  Filled 2020-02-29: qty 250

## 2020-02-29 MED ORDER — PHENYLEPHRINE HCL-NACL 20-0.9 MG/250ML-% IV SOLN
30.0000 ug/min | INTRAVENOUS | Status: AC
  Administered 2020-03-01: 08:00:00 30 ug/min via INTRAVENOUS
  Filled 2020-02-29: qty 250

## 2020-02-29 MED ORDER — VANCOMYCIN HCL 1250 MG/250ML IV SOLN
1250.0000 mg | INTRAVENOUS | Status: DC
  Filled 2020-02-29: qty 250

## 2020-02-29 MED ORDER — ASPIRIN 81 MG PO CHEW: 81.0000 mg | CHEWABLE_TABLET | ORAL | Status: DC

## 2020-02-29 MED ORDER — LIDOCAINE HCL (PF) 1 % IJ SOLN
INTRAMUSCULAR | Status: DC | PRN
  Administered 2020-02-29: 2 mL

## 2020-02-29 MED ORDER — MILRINONE LACTATE IN DEXTROSE 20-5 MG/100ML-% IV SOLN
0.3000 ug/kg/min | INTRAVENOUS | Status: DC
  Filled 2020-02-29: qty 100

## 2020-02-29 MED ORDER — HEPARIN (PORCINE) 25000 UT/250ML-% IV SOLN
1300.0000 [IU]/h | INTRAVENOUS | Status: DC
  Administered 2020-02-29: 1300 [IU]/h via INTRAVENOUS

## 2020-02-29 MED ORDER — TRANEXAMIC ACID (OHS) PUMP PRIME SOLUTION
2.0000 mg/kg | INTRAVENOUS | Status: DC
  Filled 2020-02-29: qty 1.83

## 2020-02-29 MED ORDER — SODIUM CHLORIDE 0.9 % IV SOLN: INTRAVENOUS | Status: DC

## 2020-02-29 MED ORDER — SODIUM CHLORIDE 0.9 % WEIGHT BASED INFUSION
3.0000 mL/kg/h | INTRAVENOUS | Status: DC
  Administered 2020-02-29: 3 mL/kg/h via INTRAVENOUS

## 2020-02-29 MED ORDER — SODIUM CHLORIDE 0.9 % IV SOLN
INTRAVENOUS | Status: AC | PRN
  Administered 2020-02-29: 500 mL via INTRAVENOUS

## 2020-02-29 MED ORDER — PLASMA-LYTE 148 IV SOLN
INTRAVENOUS | Status: AC
  Administered 2020-03-01: 500 mL
  Filled 2020-02-29: qty 2.5

## 2020-02-29 MED ORDER — SODIUM CHLORIDE 0.9 % IV SOLN
INTRAVENOUS | Status: DC
  Filled 2020-02-29: qty 30

## 2020-02-29 MED ORDER — MONTELUKAST SODIUM 10 MG PO TABS
10.0000 mg | ORAL_TABLET | Freq: Every day | ORAL | Status: DC
  Administered 2020-02-29: 10 mg via ORAL
  Filled 2020-02-29: qty 1

## 2020-02-29 MED ORDER — CHLORHEXIDINE GLUCONATE 0.12 % MT SOLN
15.0000 mL | Freq: Once | OROMUCOSAL | Status: AC
  Administered 2020-03-01: 15 mL via OROMUCOSAL
  Filled 2020-02-29: qty 15

## 2020-02-29 MED ORDER — LIDOCAINE HCL (PF) 1 % IJ SOLN
INTRAMUSCULAR | Status: AC
  Filled 2020-02-29: qty 30

## 2020-02-29 MED ORDER — TRANEXAMIC ACID (OHS) BOLUS VIA INFUSION
15.0000 mg/kg | INTRAVENOUS | Status: AC
  Administered 2020-03-01: 08:00:00 1371 mg via INTRAVENOUS
  Filled 2020-02-29: qty 1371

## 2020-02-29 MED ORDER — ACETAMINOPHEN 325 MG PO TABS: 650.0000 mg | ORAL_TABLET | ORAL | Status: DC | PRN

## 2020-02-29 MED ORDER — FLUTICASONE PROPIONATE 50 MCG/ACT NA SUSP
2.0000 | Freq: Every day | NASAL | Status: DC
  Administered 2020-02-29 – 2020-03-13 (×7): 2 via NASAL
  Filled 2020-02-29 (×2): qty 16

## 2020-02-29 MED ORDER — SODIUM CHLORIDE 0.9 % IV SOLN
750.0000 mg | INTRAVENOUS | Status: DC
  Filled 2020-02-29: qty 750

## 2020-02-29 MED ORDER — TEMAZEPAM 7.5 MG PO CAPS: 15.0000 mg | ORAL_CAPSULE | Freq: Once | ORAL | Status: DC | PRN

## 2020-02-29 MED ORDER — MAGNESIUM SULFATE 50 % IJ SOLN
40.0000 meq | INTRAMUSCULAR | Status: DC
  Filled 2020-02-29: qty 9.85

## 2020-02-29 MED ORDER — CHLORHEXIDINE GLUCONATE CLOTH 2 % EX PADS
6.0000 | MEDICATED_PAD | Freq: Once | CUTANEOUS | Status: AC
  Administered 2020-02-29: 6 via TOPICAL

## 2020-02-29 MED ORDER — SODIUM CHLORIDE 0.9 % IV SOLN: 250.0000 mL | INTRAVENOUS | Status: DC | PRN

## 2020-02-29 MED ORDER — NOREPINEPHRINE 4 MG/250ML-% IV SOLN
0.0000 ug/min | INTRAVENOUS | Status: DC
  Filled 2020-02-29: qty 250

## 2020-02-29 MED ORDER — HEPARIN SODIUM (PORCINE) 1000 UNIT/ML IJ SOLN
INTRAMUSCULAR | Status: AC
  Filled 2020-02-29: qty 1

## 2020-02-29 MED ORDER — SODIUM CHLORIDE 0.9% FLUSH: 3.0000 mL | Freq: Two times a day (BID) | INTRAVENOUS | Status: DC

## 2020-02-29 MED ORDER — SODIUM CHLORIDE 0.9% FLUSH
3.0000 mL | Freq: Two times a day (BID) | INTRAVENOUS | Status: DC
  Administered 2020-02-29: 21:00:00 3 mL via INTRAVENOUS

## 2020-02-29 MED ORDER — SODIUM CHLORIDE 0.9 % WEIGHT BASED INFUSION: 1.0000 mL/kg/h | INTRAVENOUS | Status: DC

## 2020-02-29 MED ORDER — CHLORHEXIDINE GLUCONATE CLOTH 2 % EX PADS
6.0000 | MEDICATED_PAD | Freq: Once | CUTANEOUS | Status: AC
  Administered 2020-03-01: 6 via TOPICAL

## 2020-02-29 MED ORDER — HEPARIN SODIUM (PORCINE) 1000 UNIT/ML IJ SOLN
INTRAMUSCULAR | Status: DC | PRN
  Administered 2020-02-29: 5000 [IU] via INTRAVENOUS

## 2020-02-29 MED ORDER — VERAPAMIL HCL 2.5 MG/ML IV SOLN
INTRAVENOUS | Status: DC | PRN
  Administered 2020-02-29: 15:00:00 10 mL via INTRA_ARTERIAL

## 2020-02-29 MED ORDER — ASPIRIN 81 MG PO CHEW: 81.0000 mg | CHEWABLE_TABLET | Freq: Every day | ORAL | Status: DC

## 2020-02-29 MED ORDER — ALBUTEROL SULFATE (2.5 MG/3ML) 0.083% IN NEBU
2.5000 mg | INHALATION_SOLUTION | RESPIRATORY_TRACT | Status: DC | PRN
  Administered 2020-03-02: 2.5 mg via RESPIRATORY_TRACT
  Filled 2020-02-29: qty 3

## 2020-02-29 MED ORDER — FENTANYL CITRATE (PF) 100 MCG/2ML IJ SOLN
INTRAMUSCULAR | Status: DC | PRN
  Administered 2020-02-29: 50 ug via INTRAVENOUS

## 2020-02-29 MED ORDER — ALPRAZOLAM 0.25 MG PO TABS
0.2500 mg | ORAL_TABLET | ORAL | Status: DC | PRN
  Administered 2020-02-29: 20:00:00 0.5 mg via ORAL
  Filled 2020-02-29: qty 2

## 2020-02-29 MED ORDER — POTASSIUM CHLORIDE 2 MEQ/ML IV SOLN
80.0000 meq | INTRAVENOUS | Status: DC
  Filled 2020-02-29: qty 40

## 2020-02-29 MED ORDER — METOPROLOL TARTRATE 12.5 MG HALF TABLET
12.5000 mg | ORAL_TABLET | Freq: Two times a day (BID) | ORAL | Status: DC
  Administered 2020-02-29 (×2): 12.5 mg via ORAL
  Filled 2020-02-29 (×2): qty 1

## 2020-02-29 MED ORDER — HEPARIN (PORCINE) IN NACL 1000-0.9 UT/500ML-% IV SOLN
INTRAVENOUS | Status: DC | PRN
  Administered 2020-02-29 (×2): 500 mL

## 2020-02-29 MED ORDER — METOPROLOL TARTRATE 12.5 MG HALF TABLET
12.5000 mg | ORAL_TABLET | Freq: Once | ORAL | Status: AC
  Administered 2020-03-01: 12.5 mg via ORAL
  Filled 2020-02-29: qty 1

## 2020-02-29 SURGICAL SUPPLY — 9 items

## 2020-02-29 NOTE — Progress Notes (Signed)
ANTICOAGULATION CONSULT NOTE  Pharmacy Consult:  Heparin Indication: chest pain/ACS  Allergies  Allergen Reactions  . Codeine Nausea And Vomiting    Patient Measurements: Height: 5\' 7"  (170.2 cm) Weight: 92.7 kg (204 lb 4.8 oz)(scale B) IBW/kg (Calculated) : 66.1 Heparin Dosing Weight: 85kg  Vital Signs: Temp: 98.7 F (37.1 C) (04/14 1954) Temp Source: Oral (04/14 1954) BP: 146/101 (04/14 2139) Pulse Rate: 71 (04/14 2139)  Labs: Recent Labs    02/28/20 2040 02/28/20 2253  HGB 17.4*  --   HCT 49.5  --   PLT 221  --   HEPARINUNFRC  --  0.17*  CREATININE 0.92  --   TROPONINIHS 4,332* 5,969*    Estimated Creatinine Clearance: 100.7 mL/min (by C-G formula based on SCr of 0.92 mg/dL).  Assessment: 46 YOM transferred from [facility] on IV heparin at 1000 units/hr.  Pharmacy consulted to continue IV heparin for r/o ACS.  Heparin level is sub-therapeutic; no issue with heparin infusion per RN.  No bleeding reported.  Goal of Therapy:  Heparin level 0.3-0.7 units/ml Monitor platelets by anticoagulation protocol: Yes   Plan:  Heparin 2500 units IV bolus, then Increase heparin gtt to 1300 units/hr Check 6 hr heparin level  Shaunte Tuft D. [facility], PharmD, BCPS, BCCCP 02/29/2020, 12:24 AM

## 2020-02-29 NOTE — Progress Notes (Signed)
Cardiology Progress Note  Patient ID: Sean Joseph MRN: [id number] DOB: 1966/11/14 Date of Encounter: 02/29/2020  Primary Cardiologist: No primary care provider on file.  Subjective  No CP. NSTEMI.   ROS:  All other ROS reviewed and negative. Pertinent positives noted in the HPI.     Inpatient Medications  Scheduled Meds: . aspirin EC  81 mg Oral Daily   Continuous Infusions: . heparin 1,300 Units/hr (02/29/20 0042)   PRN Meds: morphine injection, nitroGLYCERIN, ondansetron **OR** ondansetron (ZOFRAN) IV   Vital Signs   Vitals:   02/29/20 0025 02/29/20 0045 02/29/20 0516 02/29/20 0727  BP:  140/81 126/84 125/85  Pulse:  86 80 76  Resp:  17 20 17   Temp:  98 F (36.7 C) 98.4 F (36.9 C) 98 F (36.7 C)  TempSrc:  Oral Oral Oral  SpO2: 94% 92%  94%  Weight:   91.8 kg   Height:        Intake/Output Summary (Last 24 hours) at 02/29/2020 0744 Last data filed at 02/29/2020 03/02/2020 Gross per 24 hour  Intake 135.18 ml  Output 1350 ml  Net -1214.82 ml   Last 3 Weights 02/29/2020 02/28/2020  Weight (lbs) 202 lb 6.4 oz 204 lb 4.8 oz  Weight (kg) 91.808 kg 92.67 kg      Telemetry  Overnight telemetry shows NSR 70-80 bpm, which I personally reviewed.   ECG  The most recent ECG shows NSR without ischemic changes , which I personally reviewed.   Physical Exam   Vitals:   02/29/20 0025 02/29/20 0045 02/29/20 0516 02/29/20 0727  BP:  140/81 126/84 125/85  Pulse:  86 80 76  Resp:  17 20 17   Temp:  98 F (36.7 C) 98.4 F (36.9 C) 98 F (36.7 C)  TempSrc:  Oral Oral Oral  SpO2: 94% 92%  94%  Weight:   91.8 kg   Height:         Intake/Output Summary (Last 24 hours) at 02/29/2020 0744 Last data filed at 02/29/2020 0639 Gross per 24 hour  Intake 135.18 ml  Output 1350 ml  Net -1214.82 ml    Last 3 Weights 02/29/2020 02/28/2020  Weight (lbs) 202 lb 6.4 oz 204 lb 4.8 oz  Weight (kg) 91.808 kg 92.67 kg    Body mass index is 31.7 kg/m.  General: Well nourished, well  developed, in no acute distress Head: Atraumatic, normal size  Eyes: PEERLA, EOMI  Neck: Supple, no JVD Endocrine: No thryomegaly Cardiac: Normal S1, S2; RRR; no murmurs, rubs, or gallops Lungs: Clear to auscultation bilaterally, no wheezing, rhonchi or rales  Abd: Soft, nontender, no hepatomegaly  Ext: No edema, pulses 2+ Musculoskeletal: No deformities, BUE and BLE strength normal and equal Skin: Warm and dry, no rashes   Neuro: Alert and oriented to person, place, time, and situation, CNII-XII grossly intact, no focal deficits  Psych: Normal mood and affect   Labs  High Sensitivity Troponin:   Recent Labs  Lab 02/28/20 2040 02/28/20 2253  TROPONINIHS 4,332* 5,969*     Cardiac EnzymesNo results for input(s): TROPONINI in the last 168 hours. No results for input(s): TROPIPOC in the last 168 hours.  Chemistry Recent Labs  Lab 02/28/20 2040  NA 138  K 4.3  CL 104  CO2 21*  GLUCOSE 103*  BUN 12  CREATININE 0.92  CALCIUM 9.3  PROT 7.3  ALBUMIN 3.7  AST 46*  ALT 47*  ALKPHOS 67  BILITOT 0.5  GFRNONAA >60  GFRAA >60  ANIONGAP 13    Hematology Recent Labs  Lab 02/28/20 2040  WBC 11.6*  RBC 5.41  HGB 17.4*  HCT 49.5  MCV 91.5  MCH 32.2  MCHC 35.2  RDW 13.6  PLT 221   BNPNo results for input(s): BNP, PROBNP in the last 168 hours.  DDimer No results for input(s): DDIMER in the last 168 hours.   Radiology  DG CHEST PORT 1 VIEW  Result Date: 02/28/2020 CLINICAL DATA:  Chest pain EXAM: PORTABLE CHEST 1 VIEW COMPARISON:  Film from earlier in the same day. FINDINGS: Cardiac shadow is stable. Mild right basilar linear density is noted consistent with atelectasis/scarring stable from the recent exam. No new focal infiltrate is seen. No bony abnormality is noted. IMPRESSION: Stable opacity in the right base. Electronically Signed   By: Mark  Lukens M.D.   On: 02/28/2020 20:45    Cardiac Studies  None.   Patient Profile  Sean Joseph is a 54 y.o. male with  COPD, tobacco abuse admitted 02/28/2020 with NSTEMI.   Assessment & Plan   1. NSTEMI - CP with elevated troponin consistent with ACS. Continue ASA/heparin drip.  - EKG without ischemic changes, suspect posterior lesion.  - started metoprolol tartrate 12.5 mg BID and lipitor 80 mg QD - A1c, lipid profile, TSH pending - echo pending - NPO for LHC today   For questions or updates, please contact [facility] Please consult [email or web address] for contact info under   Time Spent with Patient: I have spent a total of 35 minutes with patient reviewing hospital notes, telemetry, EKGs, labs and examining the patient as well as establishing an assessment and plan that was discussed with the patient.  > 50% of time was spent in direct patient care.    Signed, [place] T. O'Neal, MD [place]  [facility]  02/29/2020 7:44 AM   

## 2020-02-29 NOTE — Progress Notes (Signed)
Patient sent to cath lab for cath procedure wife at bedside. Heparin drip stopped.

## 2020-02-29 NOTE — Progress Notes (Signed)
ANTICOAGULATION CONSULT NOTE  Pharmacy Consult:  Heparin Indication: chest pain/ACS  Allergies  Allergen Reactions  . Codeine Nausea And Vomiting    Patient Measurements: Height: 5\' 7"  (170.2 cm) Weight: 91.8 kg (202 lb 6.4 oz) IBW/kg (Calculated) : 66.1 Heparin Dosing Weight: 85kg  Vital Signs: Temp: 98 F (36.7 C) (04/15 0727) Temp Source: Oral (04/15 0727) BP: 125/85 (04/15 0727) Pulse Rate: 76 (04/15 0727)  Labs: Recent Labs    02/28/20 2040 02/28/20 2253 02/29/20 0659  HGB 17.4*  --   --   HCT 49.5  --   --   PLT 221  --   --   HEPARINUNFRC  --  0.17* 0.40  CREATININE 0.92  --   --   TROPONINIHS 4,332* 5,969*  --     Estimated Creatinine Clearance: 100.3 mL/min (by C-G formula based on SCr of 0.92 mg/dL).  Assessment: 65 YOM transferred from [place] with chest pain.  Pharmacy consulted to continue IV heparin for r/o ACS. Heparin now therapeutic at 0.40. No issues with bleeding observed, will continue with same rate. Will follow-up after LHC for [facility] plans.  Goal of Therapy:  Heparin level 0.3-0.7 units/ml Monitor platelets by anticoagulation protocol: Yes   Plan:  Continue heparin at 1300 units/hour 1600 cHL Daily heparin, CBC Monitor for s/sx's of bleed Follow-up anticoagulation plans post-LHC today     Zakaree Mcclenahan L. [facility], PharmD [facility] PGY1 Pharmacy Resident [phone] 02/29/20      8:26 AM  Please check AMION for all [facility] phone numbers After 10:00 PM, call the Main Pharmacy [phone]

## 2020-02-29 NOTE — Progress Notes (Signed)
   02/29/20 0035  Provider Notification  Provider Name/Title Dr. Scotty Court  Date Provider Notified 02/29/20  Time Provider Notified 0040  Notification Type Page  Notification Reason Other (Comment) (trop HS=5,969)  Response Other (Comment) (awaiting call back)

## 2020-02-29 NOTE — Anesthesia Preprocedure Evaluation (Addendum)
Anesthesia Evaluation  Patient identified by MRN, date of birth, ID band Patient awake    Reviewed: Allergy & Precautions, NPO status , Patient's Chart, lab work & pertinent test results, reviewed documented beta blocker date and time   Airway Mallampati: III  TM Distance: >3 FB Neck ROM: Full    Dental no notable dental hx. (+) Dental Advisory Given   Pulmonary Current Smoker and Patient abstained from smoking.,    Pulmonary exam normal breath sounds clear to auscultation       Cardiovascular + CAD and + Past MI  Normal cardiovascular exam Rhythm:Regular Rate:Normal  CATH: Apical severe hypokinesis. EF 40%. LVEDP 10 mmHg. Normal left main Anatomically small LAD that stopped short of the left ventricular apex.  The mid segment is totally occluded after the origin of a large septal perforator. Relatively long segment of total occlusion. A small to moderate sized LAD fills by left to left collaterals. Codominant circumflex with first obtuse marginal containing 60 and 70% stenosis proximal and distal. RCA is codominant. Proximal segmental 80% stenosis mid 60% stenosis and towards the distal portion of the mid segment there is a 95% stenosis.  The PDA runs the entire length of the posterior interventricular groove and wraps around the left ventricular apex.  ECHO: 1. Left ventricular ejection fraction, by estimation, is 55 to 60%. The left ventricle has normal function. The left ventricle has no regional wall motion abnormalities. There is mild concentric left ventricular hypertrophy. Left ventricular diastolic parameters are consistent with Grade I diastolic dysfunction (impaired relaxation). 2. Right ventricular systolic function is normal. The right ventricular size is normal. 3. The mitral valve is normal in structure. No evidence of mitral valve regurgitation. No evidence of mitral stenosis. 4. The aortic valve is normal in  structure. Aortic valve regurgitation is not visualized. No aortic stenosis is present. 5. The inferior vena cava is normal in size with greater than 50% respiratory variability, suggesting right atrial pressure of 3 mmHg.  ECG: NSR, rate 71   Neuro/Psych negative neurological ROS  negative psych ROS   GI/Hepatic negative GI ROS, Neg liver ROS,   Endo/Other  negative endocrine ROS  Renal/GU negative Renal ROS     Musculoskeletal negative musculoskeletal ROS (+)   Abdominal (+) + obese,   Peds  Hematology negative hematology ROS (+)   Anesthesia Other Findings CAD  Reproductive/Obstetrics                           Anesthesia Physical Anesthesia Plan  ASA: IV  Anesthesia Plan: General   Post-op Pain Management:    Induction: Intravenous  PONV Risk Score and Plan: 1 and Ondansetron, Midazolam, Dexamethasone and Treatment may vary due to age or medical condition  Airway Management Planned: Oral ETT  Additional Equipment: Arterial line, CVP, TEE and Ultrasound Guidance Line Placement  Intra-op Plan:   Post-operative Plan: Post-operative intubation/ventilation  Informed Consent: I have reviewed the patients History and Physical, chart, labs and discussed the procedure including the risks, benefits and alternatives for the proposed anesthesia with the patient or authorized representative who has indicated his/her understanding and acceptance.     Dental advisory given  Plan Discussed with: CRNA  Anesthesia Plan Comments:        Anesthesia Quick Evaluation

## 2020-02-29 NOTE — Consult Note (Signed)
Reason for Consult:3 vessel CAD s/p Non-STEMI  Referring Physician: Dr. Myra Rude Sean Joseph is an 54 y.o. male.  HPI: Sean Joseph is a 54 yo man with a past history of reflux and hypertriglyceridemia. Also has a history of tobacco abuse and a strong family history of premature CAD. Father had CABG at 81 yo. Was in his usual state of health until yesterday when he developed onset of a severe burning pain in his chest. Then radiated to arm and associated with severe pressure. He was taken to ED at [facility]. Pain improved after IV morphine and sublingual nitroglycerin. Initial enzymes negative but ruled in for nonSTEMI with troponin of 7,024. Transferred to [facility] and today underwent cardiac cath which revealed 3 vessel CAD with a 95% RCA stenosis, total occlusion of the LAD and a 60-70% stenosis of OM1. EF 40%.  Currently pain free  Past Medical History:  Diagnosis Date  . GERD (gastroesophageal reflux disease)     Past Surgical History:  Procedure Laterality Date  . HERNIA REPAIR      Family History  Problem Relation Age of Onset  . CAD Mother   . CAD Father     Social History:  reports that he has been smoking. He has never used smokeless tobacco. He reports current alcohol use. He reports that he does not use drugs.  Allergies:  Allergies  Allergen Reactions  . Codeine Nausea And Vomiting    Medications:  Prior to Admission:  Medications Prior to Admission  Medication Sig Dispense Refill Last Dose  . albuterol (VENTOLIN HFA) 108 (90 Base) MCG/ACT inhaler Inhale 1-2 puffs into the lungs every 4 (four) hours as needed for wheezing or shortness of breath.   02/28/2020 at Unknown time  . montelukast (SINGULAIR) 10 MG tablet Take 1 tablet (10 mg total) by mouth at bedtime. (Patient taking differently: Take 10 mg by mouth daily. ) 90 tablet 0 02/28/2020 at Unknown time    Results for orders placed or performed during the hospital encounter of 02/28/20 (from the past 48 hour(s))  SARS  CORONAVIRUS 2 (TAT 6-24 HRS) Nasopharyngeal Nasopharyngeal Swab     Status: None   Collection Time: 02/28/20  8:00 PM   Specimen: Nasopharyngeal Swab  Result Value Ref Range   SARS Coronavirus 2 NEGATIVE NEGATIVE    Comment: (NOTE) SARS-CoV-2 target nucleic acids are NOT DETECTED. The SARS-CoV-2 RNA is generally detectable in upper and lower respiratory specimens during the acute phase of infection. Negative results do not preclude SARS-CoV-2 infection, do not rule out co-infections with other pathogens, and should not be used as the sole basis for treatment or other patient management decisions. Negative results must be combined with clinical observations, patient history, and epidemiological information. The expected result is Negative. Fact Sheet for Patients: HairSlick.no Fact Sheet for Healthcare Providers: quierodirigir.com This test is not yet approved or cleared by the [place] FDA and  has been authorized for detection and/or diagnosis of SARS-CoV-2 by FDA under an Emergency Use Authorization (EUA). This EUA will remain  in effect (meaning this test can be used) for the duration of the COVID-19 declaration under Section 56 4(b)(1) of the Act, 21 U.S.C. section 360bbb-3(b)(1), unless the authorization is terminated or revoked sooner. Performed at [facility], [address] [place], [place] [address]   HIV Antibody (routine testing w rflx)     Status: None   Collection Time: 02/28/20  8:40 PM  Result Value Ref Range   HIV Screen  4th Generation wRfx NON REACTIVE NON REACTIVE    Comment: Performed at [facility], [address] [place], [place] [address]  Comprehensive metabolic panel     Status: Abnormal   Collection Time: 02/28/20  8:40 PM  Result Value Ref Range   Sodium 138 135 - 145 mmol/L   Potassium 4.3 3.5 - 5.1 mmol/L   Chloride 104 98 - 111 mmol/L   CO2 21 (L) 22 - 32 mmol/L   Glucose,  Bld 103 (H) 70 - 99 mg/dL    Comment: Glucose reference range applies only to samples taken after fasting for at least 8 hours.   BUN 12 6 - 20 mg/dL   Creatinine, Ser 1.09 0.61 - 1.24 mg/dL   Calcium 9.3 8.9 - 32.3 mg/dL   Total Protein 7.3 6.5 - 8.1 g/dL   Albumin 3.7 3.5 - 5.0 g/dL   AST 46 (H) 15 - 41 U/L   ALT 47 (H) 0 - 44 U/L   Alkaline Phosphatase 67 38 - 126 U/L   Total Bilirubin 0.5 0.3 - 1.2 mg/dL   GFR calc non Af Amer >60 >60 mL/min   GFR calc Af Amer >60 >60 mL/min   Anion gap 13 5 - 15    Comment: Performed at [facility], [address] [place], [place] [address]  CBC WITH DIFFERENTIAL     Status: Abnormal   Collection Time: 02/28/20  8:40 PM  Result Value Ref Range   WBC 11.6 (H) 4.0 - 10.5 K/uL   RBC 5.41 4.22 - 5.81 MIL/uL   Hemoglobin 17.4 (H) 13.0 - 17.0 g/dL   HCT 20.2 54.2 - 70.6 %   MCV 91.5 80.0 - 100.0 fL   MCH 32.2 26.0 - 34.0 pg   MCHC 35.2 30.0 - 36.0 g/dL   RDW 23.7 62.8 - 31.5 %   Platelets 221 150 - 400 K/uL   nRBC 0.0 0.0 - 0.2 %   Neutrophils Relative % 52 %   Neutro Abs 6.2 1.7 - 7.7 K/uL   Lymphocytes Relative 37 %   Lymphs Abs 4.3 (H) 0.7 - 4.0 K/uL   Monocytes Relative 7 %   Monocytes Absolute 0.8 0.1 - 1.0 K/uL   Eosinophils Relative 2 %   Eosinophils Absolute 0.2 0.0 - 0.5 K/uL   Basophils Relative 1 %   Basophils Absolute 0.1 0.0 - 0.1 K/uL   Immature Granulocytes 1 %   Abs Immature Granulocytes 0.06 0.00 - 0.07 K/uL    Comment: Performed at [facility], [address], [place], [place] [address]  Troponin I (High Sensitivity)     Status: Abnormal   Collection Time: 02/28/20  8:40 PM  Result Value Ref Range   Troponin I (High Sensitivity) 4,332 (HH) <18 ng/L    Comment: CRITICAL RESULT CALLED TO, READ BACK BY AND VERIFIED WITH: RN G RAFUR @2140  02/28/20 BY S GEZAHEGN (NOTE) Elevated high sensitivity troponin I (hsTnI) values and significant  changes across serial measurements may suggest ACS but many other   chronic and acute conditions are known to elevate hsTnI results.  Refer to the Links section for chest pain algorithms and additional  guidance. Performed at [facility], [address] [place], [place]   Lipase, blood     Status: None   Collection Time: 02/28/20  8:40 PM  Result Value Ref Range   Lipase 27 11 - 51 U/L    Comment: Performed at  [facility], [address], [place], [place] [address]  Heparin level (unfractionated)     Status: Abnormal   Collection Time: 02/28/20 10:53 PM  Result Value Ref Range   Heparin Unfractionated 0.17 (L) 0.30 - 0.70 IU/mL    Comment: (NOTE) If heparin results are below expected values, and patient dosage has  been confirmed, suggest follow up testing of antithrombin III levels. Performed at [facility], [address], [place], [place] [address]   Troponin I (High Sensitivity)     Status: Abnormal   Collection Time: 02/28/20 10:53 PM  Result Value Ref Range   Troponin I (High Sensitivity) 5,969 (HH) <18 ng/L    Comment: CRITICAL VALUE NOTED.  VALUE IS CONSISTENT WITH PREVIOUSLY REPORTED AND CALLED VALUE. (NOTE) Elevated high sensitivity troponin I (hsTnI) values and significant  changes across serial measurements may suggest ACS but many other  chronic and acute conditions are known to elevate hsTnI results.  Refer to the Links section for chest pain algorithms and additional  guidance. Performed at [facility], [address], [place], [place] [address]   Basic metabolic panel     Status: Abnormal   Collection Time: 02/29/20  6:59 AM  Result Value Ref Range   Sodium 138 135 - 145 mmol/L   Potassium 4.2 3.5 - 5.1 mmol/L   Chloride 103 98 - 111 mmol/L   CO2 25 22 - 32 mmol/L   Glucose, Bld 106 (H) 70 - 99 mg/dL    Comment: Glucose reference range applies only to samples taken after fasting for at least 8 hours.   BUN 11 6 - 20 mg/dL   Creatinine, Ser 4.16 0.61 - 1.24 mg/dL   Calcium 9.3 8.9 -  60.6 mg/dL   GFR calc non Af Amer >60 >60 mL/min   GFR calc Af Amer >60 >60 mL/min   Anion gap 10 5 - 15    Comment: Performed at [facility], [address] [place], [place] [address]  CBC     Status: Abnormal   Collection Time: 02/29/20  6:59 AM  Result Value Ref Range   WBC 11.6 (H) 4.0 - 10.5 K/uL   RBC 5.64 4.22 - 5.81 MIL/uL   Hemoglobin 17.9 (H) 13.0 - 17.0 g/dL   HCT 10.9 (H) 32.3 - 55.7 %   MCV 93.4 80.0 - 100.0 fL   MCH 31.7 26.0 - 34.0 pg   MCHC 34.0 30.0 - 36.0 g/dL   RDW 32.2 02.5 - 42.7 %   Platelets 228 150 - 400 K/uL   nRBC 0.0 0.0 - 0.2 %    Comment: Performed at [facility], [address] [place], [place] [address]  Lipid panel     Status: Abnormal   Collection Time: 02/29/20  6:59 AM  Result Value Ref Range   Cholesterol 222 (H) 0 - 200 mg/dL   Triglycerides 628 (H) <150 mg/dL   HDL 32 (L) >31 mg/dL   Total CHOL/HDL Ratio 6.9 RATIO   VLDL 46 (H) 0 - 40 mg/dL   LDL Cholesterol 517 (H) 0 - 99 mg/dL    Comment:        Total Cholesterol/HDL:CHD Risk Coronary Heart Disease Risk Table                     Men   Women  1/2 Average Risk   3.4   3.3  Average Risk  5.0   4.4  2 X Average Risk   9.6   7.1  3 X Average Risk  23.4   11.0        Use the calculated Patient Ratio above and the CHD Risk Table to determine the patient's CHD Risk.        ATP III CLASSIFICATION (LDL):  <100     mg/dL   Optimal  941-740  mg/dL   Near or Above                    Optimal  130-159  mg/dL   Borderline  814-481  mg/dL   High  >856     mg/dL   Very High Performed at [facility], [address], [place], [place] [address]   TSH     Status: None   Collection Time: 02/29/20  6:59 AM  Result Value Ref Range   TSH 1.549 0.350 - 4.500 uIU/mL    Comment: Performed by a 3rd Generation assay with a functional sensitivity of <=0.01 uIU/mL. Performed at [facility], [address] [place], [place] [address]   Hemoglobin A1c     Status:  Abnormal   Collection Time: 02/29/20  6:59 AM  Result Value Ref Range   Hgb A1c MFr Bld 6.0 (H) 4.8 - 5.6 %    Comment: (NOTE) Pre diabetes:          5.7%-6.4% Diabetes:              >6.4% Glycemic control for   <7.0% adults with diabetes    Mean Plasma Glucose 125.5 mg/dL    Comment: Performed at [facility], [address], [place], [place] [address]  Heparin level (unfractionated)     Status: None   Collection Time: 02/29/20  6:59 AM  Result Value Ref Range   Heparin Unfractionated 0.40 0.30 - 0.70 IU/mL    Comment: (NOTE) If heparin results are below expected values, and patient dosage has  been confirmed, suggest follow up testing of antithrombin III levels. Performed at [facility], [address], [place], [place] [address]   Troponin I (High Sensitivity)     Status: Abnormal   Collection Time: 02/29/20  6:59 AM  Result Value Ref Range   Troponin I (High Sensitivity) 7,024 (HH) <18 ng/L    Comment: CRITICAL VALUE NOTED.  VALUE IS CONSISTENT WITH PREVIOUSLY REPORTED AND CALLED VALUE. (NOTE) Elevated high sensitivity troponin I (hsTnI) values and significant  changes across serial measurements may suggest ACS but many other  chronic and acute conditions are known to elevate hsTnI results.  Refer to the Links section for chest pain algorithms and additional  guidance. Performed at [facility], [address], [place], [place] [address]   Troponin I (High Sensitivity)     Status: Abnormal   Collection Time: 02/29/20  9:18 AM  Result Value Ref Range   Troponin I (High Sensitivity) 5,861 (HH) <18 ng/L    Comment: CRITICAL VALUE NOTED.  VALUE IS CONSISTENT WITH PREVIOUSLY REPORTED AND CALLED VALUE. (NOTE) Elevated high sensitivity troponin I (hsTnI) values and significant  changes across serial measurements may suggest ACS but many other  chronic and acute conditions are known to elevate hsTnI results.  Refer to the Links section for chest pain  algorithms and additional  guidance. Performed at [facility], [address] [place] [address] [place], [place] [address]     CARDIAC CATHETERIZATION  Result Date: 02/29/2020  Apical severe hypokinesis.  EF 40%.  LVEDP 10 mmHg.  Normal left main  Anatomically small LAD that stopped short of the left ventricular apex.  The mid segment is totally occluded after the origin of a large septal perforator.  Relatively long segment of total occlusion.  A small to moderate sized LAD fills by left to left collaterals.  Codominant circumflex with first obtuse marginal containing 60 and 70% stenosis proximal and distal.  RCA is codominant.  Proximal segmental 80% stenosis mid 60% stenosis and towards the distal portion of the mid segment there is a 95% stenosis.  The PDA runs the entire length of the posterior interventricular groove and wraps around the left ventricular apex. RECOMMENDATIONS:  Debate treatment strategy. A potential option is extensive stenting in the proximal to distal RCA and treat LAD and obtuse marginal disease with medical therapy.  An alternative approach would be to consider arterial grafting of LAD if technically possible as well as arterial grafting of the right coronary with medical therapy of the obtuse marginal.  Given the patient's young age, it would be worth having surgical consultation prior to considering intervention.  The interventional team will need to weigh in relative to final treatment decision.  DG CHEST PORT 1 VIEW  Result Date: 02/28/2020 CLINICAL DATA:  Chest pain EXAM: PORTABLE CHEST 1 VIEW COMPARISON:  Film from earlier in the same day. FINDINGS: Cardiac shadow is stable. Mild right basilar linear density is noted consistent with atelectasis/scarring stable from the recent exam. No new focal infiltrate is seen. No bony abnormality is noted. IMPRESSION: Stable opacity in the right base. Electronically Signed   By: Alcide Clever M.D.   On: 02/28/2020 20:45   I personally  reviewed the cath images and concur with the findings noted above Review of Systems  Constitutional: Positive for activity change and fatigue. Negative for unexpected weight change.  HENT: Negative for trouble swallowing and voice change.   Eyes: Negative for visual disturbance.  Respiratory: Positive for wheezing.   Cardiovascular: Positive for chest pain. Negative for palpitations and leg swelling.  Gastrointestinal: Positive for abdominal pain (reflux).  Genitourinary: Negative for dysuria and hematuria.  Musculoskeletal: Negative for arthralgias and myalgias.  Neurological: Negative for syncope and weakness.  Hematological: Negative for adenopathy. Does not bruise/bleed easily.  All other systems reviewed and are negative.  Blood pressure 101/71, pulse 80, temperature 97.9 F (36.6 C), temperature source Oral, resp. rate 20, height  (1.702 m), weight 91.4 kg, SpO2 94 %. Physical Exam  Vitals reviewed. Constitutional: He is oriented to person, place, and time. He appears well-developed and well-nourished. No distress.  HENT:  Head: Normocephalic and atraumatic.  Eyes: Conjunctivae and EOM are normal. No scleral icterus.  Neck: No thyromegaly present.  Cardiovascular: Normal rate, regular rhythm, normal heart sounds and intact distal pulses. Exam reveals no gallop and no friction rub.  No murmur heard. Respiratory: Breath sounds normal. No respiratory distress. He has no wheezes. He has no rales.  GI: Soft. He exhibits no distension.  Musculoskeletal:        General: No edema.  Lymphadenopathy:    He has no cervical adenopathy.  Neurological: He is alert and oriented to person, place, and time. No cranial nerve deficit. Coordination normal.  Skin: Skin is warm and dry.    Assessment/Plan: Sean Joseph is a 54 yo man with a past history of tobacco abuse, hypertriglyceridemia, and a strong family history of CAD. He presented with  severe 10/10 chest pain and ruled in for a  non-STEMI. He has severe 3 vessel CAD with a chronically totally occluded LAD, a critical 95% stenosis in the RCA and a 70% stenosis in OM1.  Revascularization is indicated for survival benefit and relief of symptoms. Options include CABG vs complex PCI of RCA. CABG is more likely to give a complete revascularization. LAD appears relatively small on cath but there does appear to be a graftable segment. Would plan to use bilateral IMA.  I discussed the general nature of the procedure, the need for general anesthesia, the incisions to be used, the use of cardiopulmonary bypass, and the use of drainage tubes postoperatively with Mr and Mrs Cary. We discussed the expected hospital stay, overall recovery and short and long term outcomes. I informed them of the indications, risks, benefits and alternatives. They understand the risks include, but are not limited to death, stroke, MI, DVT/PE, bleeding, possible need for transfusion, infections, cardiac arrhythmias, as well as other organ system dysfunction including respiratory, renal, or GI complications.   He accepts the risks and agrees to proceed.  Tentatively for CABG tomorrow pending discussion with Interventional Cardiology  Melrose Nakayama 02/29/2020, 4:32 PM

## 2020-02-29 NOTE — Interval H&P Note (Signed)
Cath Lab Visit (complete for each Cath Lab visit)  Clinical Evaluation Leading to the Procedure:   ACS: Yes.    Non-ACS:    Anginal Classification: CCS III  Anti-ischemic medical therapy: No Therapy  Non-Invasive Test Results: No non-invasive testing performed  Prior CABG: No previous CABG      History and Physical Interval Note:  02/29/2020 2:30 PM  Sean Joseph  has presented today for surgery, with the diagnosis of nonstemi.  The various methods of treatment have been discussed with the patient and family. After consideration of risks, benefits and other options for treatment, the patient has consented to  Procedure(s): LEFT HEART CATH AND CORONARY ANGIOGRAPHY (N/A) as a surgical intervention.  The patient's history has been reviewed, patient examined, no change in status, stable for surgery.  I have reviewed the patient's chart and labs.  Questions were answered to the patient's satisfaction.     Lyn Records III

## 2020-02-29 NOTE — CV Procedure (Signed)
   Left heart cath with coronary angiography via right radial using real-time vascular ultrasound for arterial access.  Totally occluded mid LAD.  LAD is congenitally a relatively small distribution territory.  Left to left collaterals are noted.  Severely diseased first obtuse marginal from proximal to distal.  The circumflex is co-dominant and is otherwise widely patent.  Right coronary is severely diseased from proximal to distal.  95 to 99% obstruction.  Large PDA the wraps around the left ventricular apex is noted.  Severe apical hypokinesis.  EF 55%.  LVEDP 16 mmHg  Multivessel coronary disease.  Needs heart team approach to determine if he should have two-vessel coronary bypass grafting or percutaneous approach.  The LAD segment is relatively small.  Could be attempted with CTO although segment appears long.  Right coronary could be treated with multiple stents from proximal to distal.  If LAD is large enough an alternative approach would be LIMA to LAD, saphenous vein graft or right internal mammary to distal RCA and saphenous vein graft obtuse marginal #1.  Decided to discuss before PCI on RCA.

## 2020-02-29 NOTE — Plan of Care (Signed)

## 2020-02-29 NOTE — Progress Notes (Signed)
  Echocardiogram 2D Echocardiogram has been performed.  Delcie Roch 02/29/2020, 2:20 PM

## 2020-02-29 NOTE — Progress Notes (Signed)
ANTICOAGULATION CONSULT NOTE  Pharmacy Consult:  Heparin Indication: chest pain/ACS  Allergies  Allergen Reactions  . Codeine Nausea And Vomiting    Patient Measurements: Height: 5\' 7"  (170.2 cm) Weight: 91.4 kg (201 lb 8 oz) IBW/kg (Calculated) : 66.1 Heparin Dosing Weight: 85kg  Vital Signs: Temp: 97.9 F (36.6 C) (04/15 1531) Temp Source: Oral (04/15 1531) BP: 101/71 (04/15 1531) Pulse Rate: 80 (04/15 1531)  Labs: Recent Labs    02/28/20 2040 02/28/20 2040 02/28/20 2253 02/29/20 0659 02/29/20 0918  HGB 17.4*  --   --  17.9*  --   HCT 49.5  --   --  52.7*  --   PLT 221  --   --  228  --   HEPARINUNFRC  --   --  0.17* 0.40  --   CREATININE 0.92  --   --  0.97  --   TROPONINIHS 4,332*   < > 5,969* 7,024* 5,861*   < > = values in this interval not displayed.    Estimated Creatinine Clearance: 94.9 mL/min (by C-G formula based on SCr of 0.97 mg/dL).  Assessment: 64 YOM transferred from [place] with chest pain.  Pharmacy consulted to continue IV heparin for r/o ACS. Heparin now therapeutic at 0.40. No issues with bleeding observed, will continue with same rate.   Pt s/p LHC with multivessel CAD, pharmacy to resume IV heparin in 8hr while CVTS is consulted to consider bypass.  Goal of Therapy:  Heparin level 0.3-0.7 units/ml Monitor platelets by anticoagulation protocol: Yes   Plan:  -Restart heparin 1300 units/h no bolus at 2300 -Check 6h heparin level after restart -F/U revascularization plans   [place], PharmD, BCPS Clinical Pharmacist [phone] Please check AMION for all [facility] Pharmacy numbers 02/29/2020

## 2020-02-29 NOTE — H&P (View-Only) (Signed)
Cardiology Progress Note  Patient ID: Sean Joseph MRN: [id number] DOB: 1966/11/14 Date of Encounter: 02/29/2020  Primary Cardiologist: No primary care provider on file.  Subjective  No CP. NSTEMI.   ROS:  All other ROS reviewed and negative. Pertinent positives noted in the HPI.     Inpatient Medications  Scheduled Meds: . aspirin EC  81 mg Oral Daily   Continuous Infusions: . heparin 1,300 Units/hr (02/29/20 0042)   PRN Meds: morphine injection, nitroGLYCERIN, ondansetron **OR** ondansetron (ZOFRAN) IV   Vital Signs   Vitals:   02/29/20 0025 02/29/20 0045 02/29/20 0516 02/29/20 0727  BP:  140/81 126/84 125/85  Pulse:  86 80 76  Resp:  17 20 17   Temp:  98 F (36.7 C) 98.4 F (36.9 C) 98 F (36.7 C)  TempSrc:  Oral Oral Oral  SpO2: 94% 92%  94%  Weight:   91.8 kg   Height:        Intake/Output Summary (Last 24 hours) at 02/29/2020 0744 Last data filed at 02/29/2020 03/02/2020 Gross per 24 hour  Intake 135.18 ml  Output 1350 ml  Net -1214.82 ml   Last 3 Weights 02/29/2020 02/28/2020  Weight (lbs) 202 lb 6.4 oz 204 lb 4.8 oz  Weight (kg) 91.808 kg 92.67 kg      Telemetry  Overnight telemetry shows NSR 70-80 bpm, which I personally reviewed.   ECG  The most recent ECG shows NSR without ischemic changes , which I personally reviewed.   Physical Exam   Vitals:   02/29/20 0025 02/29/20 0045 02/29/20 0516 02/29/20 0727  BP:  140/81 126/84 125/85  Pulse:  86 80 76  Resp:  17 20 17   Temp:  98 F (36.7 C) 98.4 F (36.9 C) 98 F (36.7 C)  TempSrc:  Oral Oral Oral  SpO2: 94% 92%  94%  Weight:   91.8 kg   Height:         Intake/Output Summary (Last 24 hours) at 02/29/2020 0744 Last data filed at 02/29/2020 0639 Gross per 24 hour  Intake 135.18 ml  Output 1350 ml  Net -1214.82 ml    Last 3 Weights 02/29/2020 02/28/2020  Weight (lbs) 202 lb 6.4 oz 204 lb 4.8 oz  Weight (kg) 91.808 kg 92.67 kg    Body mass index is 31.7 kg/m.  General: Well nourished, well  developed, in no acute distress Head: Atraumatic, normal size  Eyes: PEERLA, EOMI  Neck: Supple, no JVD Endocrine: No thryomegaly Cardiac: Normal S1, S2; RRR; no murmurs, rubs, or gallops Lungs: Clear to auscultation bilaterally, no wheezing, rhonchi or rales  Abd: Soft, nontender, no hepatomegaly  Ext: No edema, pulses 2+ Musculoskeletal: No deformities, BUE and BLE strength normal and equal Skin: Warm and dry, no rashes   Neuro: Alert and oriented to person, place, time, and situation, CNII-XII grossly intact, no focal deficits  Psych: Normal mood and affect   Labs  High Sensitivity Troponin:   Recent Labs  Lab 02/28/20 2040 02/28/20 2253  TROPONINIHS 4,332* 5,969*     Cardiac EnzymesNo results for input(s): TROPONINI in the last 168 hours. No results for input(s): TROPIPOC in the last 168 hours.  Chemistry Recent Labs  Lab 02/28/20 2040  NA 138  K 4.3  CL 104  CO2 21*  GLUCOSE 103*  BUN 12  CREATININE 0.92  CALCIUM 9.3  PROT 7.3  ALBUMIN 3.7  AST 46*  ALT 47*  ALKPHOS 67  BILITOT 0.5  GFRNONAA >60  GFRAA >60  ANIONGAP 13    Hematology Recent Labs  Lab 02/28/20 2040  WBC 11.6*  RBC 5.41  HGB 17.4*  HCT 49.5  MCV 91.5  MCH 32.2  MCHC 35.2  RDW 13.6  PLT 221   BNPNo results for input(s): BNP, PROBNP in the last 168 hours.  DDimer No results for input(s): DDIMER in the last 168 hours.   Radiology  DG CHEST PORT 1 VIEW  Result Date: 02/28/2020 CLINICAL DATA:  Chest pain EXAM: PORTABLE CHEST 1 VIEW COMPARISON:  Film from earlier in the same day. FINDINGS: Cardiac shadow is stable. Mild right basilar linear density is noted consistent with atelectasis/scarring stable from the recent exam. No new focal infiltrate is seen. No bony abnormality is noted. IMPRESSION: Stable opacity in the right base. Electronically Signed   By: Inez Catalina M.D.   On: 02/28/2020 20:45    Cardiac Studies  None.   Patient Profile  Sean Joseph is a 54 y.o. male with  COPD, tobacco abuse admitted 02/28/2020 with NSTEMI.   Assessment & Plan   1. NSTEMI - CP with elevated troponin consistent with ACS. Continue ASA/heparin drip.  - EKG without ischemic changes, suspect posterior lesion.  - started metoprolol tartrate 12.5 mg BID and lipitor 80 mg QD - A1c, lipid profile, TSH pending - echo pending - NPO for LHC today   For questions or updates, please contact [facility] Please consult [email or web address] for contact info under   Time Spent with Patient: I have spent a total of 35 minutes with patient reviewing hospital notes, telemetry, EKGs, labs and examining the patient as well as establishing an assessment and plan that was discussed with the patient.  > 50% of time was spent in direct patient care.    Signed, Addison Naegeli. Audie Box, [place]  02/29/2020 7:44 AM

## 2020-02-29 NOTE — Progress Notes (Signed)
Pre-CABG Dopplers completed. Refer to "CV Proc" under chart review to view preliminary results.  02/29/2020 4:53 PM Eula Fried., MHA, RVT, RDCS, RDMS

## 2020-02-29 NOTE — H&P (View-Only) (Signed)
Reason for Consult:3 vessel CAD s/p Non-STEMI  Referring Physician: Dr. Myra Rude Sean Joseph is an 54 y.o. male.  HPI: Sean Joseph is a 54 yo man with a past history of reflux and hypertriglyceridemia. Also has a history of tobacco abuse and a strong family history of premature CAD. Father had CABG at 81 yo. Was in his usual state of health until yesterday when he developed onset of a severe burning pain in his chest. Then radiated to arm and associated with severe pressure. He was taken to ED at [facility]. Pain improved after IV morphine and sublingual nitroglycerin. Initial enzymes negative but ruled in for nonSTEMI with troponin of 7,024. Transferred to [facility] and today underwent cardiac cath which revealed 3 vessel CAD with a 95% RCA stenosis, total occlusion of the LAD and a 60-70% stenosis of OM1. EF 40%.  Currently pain free  Past Medical History:  Diagnosis Date  . GERD (gastroesophageal reflux disease)     Past Surgical History:  Procedure Laterality Date  . HERNIA REPAIR      Family History  Problem Relation Age of Onset  . CAD Mother   . CAD Father     Social History:  reports that he has been smoking. He has never used smokeless tobacco. He reports current alcohol use. He reports that he does not use drugs.  Allergies:  Allergies  Allergen Reactions  . Codeine Nausea And Vomiting    Medications:  Prior to Admission:  Medications Prior to Admission  Medication Sig Dispense Refill Last Dose  . albuterol (VENTOLIN HFA) 108 (90 Base) MCG/ACT inhaler Inhale 1-2 puffs into the lungs every 4 (four) hours as needed for wheezing or shortness of breath.   02/28/2020 at Unknown time  . montelukast (SINGULAIR) 10 MG tablet Take 1 tablet (10 mg total) by mouth at bedtime. (Patient taking differently: Take 10 mg by mouth daily. ) 90 tablet 0 02/28/2020 at Unknown time    Results for orders placed or performed during the hospital encounter of 02/28/20 (from the past 48 hour(s))  SARS  CORONAVIRUS 2 (TAT 6-24 HRS) Nasopharyngeal Nasopharyngeal Swab     Status: None   Collection Time: 02/28/20  8:00 PM   Specimen: Nasopharyngeal Swab  Result Value Ref Range   SARS Coronavirus 2 NEGATIVE NEGATIVE    Comment: (NOTE) SARS-CoV-2 target nucleic acids are NOT DETECTED. The SARS-CoV-2 RNA is generally detectable in upper and lower respiratory specimens during the acute phase of infection. Negative results do not preclude SARS-CoV-2 infection, do not rule out co-infections with other pathogens, and should not be used as the sole basis for treatment or other patient management decisions. Negative results must be combined with clinical observations, patient history, and epidemiological information. The expected result is Negative. Fact Sheet for Patients: HairSlick.no Fact Sheet for Healthcare Providers: quierodirigir.com This test is not yet approved or cleared by the [place] FDA and  has been authorized for detection and/or diagnosis of SARS-CoV-2 by FDA under an Emergency Use Authorization (EUA). This EUA will remain  in effect (meaning this test can be used) for the duration of the COVID-19 declaration under Section 56 4(b)(1) of the Act, 21 U.S.C. section 360bbb-3(b)(1), unless the authorization is terminated or revoked sooner. Performed at [facility], [address] [place], [place] [address]   HIV Antibody (routine testing w rflx)     Status: None   Collection Time: 02/28/20  8:40 PM  Result Value Ref Range   HIV Screen  4th Generation wRfx NON REACTIVE NON REACTIVE    Comment: Performed at [facility], [address] [place], [place] [address]  Comprehensive metabolic panel     Status: Abnormal   Collection Time: 02/28/20  8:40 PM  Result Value Ref Range   Sodium 138 135 - 145 mmol/L   Potassium 4.3 3.5 - 5.1 mmol/L   Chloride 104 98 - 111 mmol/L   CO2 21 (L) 22 - 32 mmol/L   Glucose,  Bld 103 (H) 70 - 99 mg/dL    Comment: Glucose reference range applies only to samples taken after fasting for at least 8 hours.   BUN 12 6 - 20 mg/dL   Creatinine, Ser 1.09 0.61 - 1.24 mg/dL   Calcium 9.3 8.9 - 32.3 mg/dL   Total Protein 7.3 6.5 - 8.1 g/dL   Albumin 3.7 3.5 - 5.0 g/dL   AST 46 (H) 15 - 41 U/L   ALT 47 (H) 0 - 44 U/L   Alkaline Phosphatase 67 38 - 126 U/L   Total Bilirubin 0.5 0.3 - 1.2 mg/dL   GFR calc non Af Amer >60 >60 mL/min   GFR calc Af Amer >60 >60 mL/min   Anion gap 13 5 - 15    Comment: Performed at [facility], [address] [place], [place] [address]  CBC WITH DIFFERENTIAL     Status: Abnormal   Collection Time: 02/28/20  8:40 PM  Result Value Ref Range   WBC 11.6 (H) 4.0 - 10.5 K/uL   RBC 5.41 4.22 - 5.81 MIL/uL   Hemoglobin 17.4 (H) 13.0 - 17.0 g/dL   HCT 20.2 54.2 - 70.6 %   MCV 91.5 80.0 - 100.0 fL   MCH 32.2 26.0 - 34.0 pg   MCHC 35.2 30.0 - 36.0 g/dL   RDW 23.7 62.8 - 31.5 %   Platelets 221 150 - 400 K/uL   nRBC 0.0 0.0 - 0.2 %   Neutrophils Relative % 52 %   Neutro Abs 6.2 1.7 - 7.7 K/uL   Lymphocytes Relative 37 %   Lymphs Abs 4.3 (H) 0.7 - 4.0 K/uL   Monocytes Relative 7 %   Monocytes Absolute 0.8 0.1 - 1.0 K/uL   Eosinophils Relative 2 %   Eosinophils Absolute 0.2 0.0 - 0.5 K/uL   Basophils Relative 1 %   Basophils Absolute 0.1 0.0 - 0.1 K/uL   Immature Granulocytes 1 %   Abs Immature Granulocytes 0.06 0.00 - 0.07 K/uL    Comment: Performed at [facility], [address], [place], [place] [address]  Troponin I (High Sensitivity)     Status: Abnormal   Collection Time: 02/28/20  8:40 PM  Result Value Ref Range   Troponin I (High Sensitivity) 4,332 (HH) <18 ng/L    Comment: CRITICAL RESULT CALLED TO, READ BACK BY AND VERIFIED WITH: RN G RAFUR @2140  02/28/20 BY S GEZAHEGN (NOTE) Elevated high sensitivity troponin I (hsTnI) values and significant  changes across serial measurements may suggest ACS but many other   chronic and acute conditions are known to elevate hsTnI results.  Refer to the Links section for chest pain algorithms and additional  guidance. Performed at [facility], [address] [place], [place]   Lipase, blood     Status: None   Collection Time: 02/28/20  8:40 PM  Result Value Ref Range   Lipase 27 11 - 51 U/L    Comment: Performed at  [facility], [address], [place], [place] [address]  Heparin level (unfractionated)     Status: Abnormal   Collection Time: 02/28/20 10:53 PM  Result Value Ref Range   Heparin Unfractionated 0.17 (L) 0.30 - 0.70 IU/mL    Comment: (NOTE) If heparin results are below expected values, and patient dosage has  been confirmed, suggest follow up testing of antithrombin III levels. Performed at [facility], [address], [place], [place] [address]   Troponin I (High Sensitivity)     Status: Abnormal   Collection Time: 02/28/20 10:53 PM  Result Value Ref Range   Troponin I (High Sensitivity) 5,969 (HH) <18 ng/L    Comment: CRITICAL VALUE NOTED.  VALUE IS CONSISTENT WITH PREVIOUSLY REPORTED AND CALLED VALUE. (NOTE) Elevated high sensitivity troponin I (hsTnI) values and significant  changes across serial measurements may suggest ACS but many other  chronic and acute conditions are known to elevate hsTnI results.  Refer to the Links section for chest pain algorithms and additional  guidance. Performed at [facility], [address], [place], [place] [address]   Basic metabolic panel     Status: Abnormal   Collection Time: 02/29/20  6:59 AM  Result Value Ref Range   Sodium 138 135 - 145 mmol/L   Potassium 4.2 3.5 - 5.1 mmol/L   Chloride 103 98 - 111 mmol/L   CO2 25 22 - 32 mmol/L   Glucose, Bld 106 (H) 70 - 99 mg/dL    Comment: Glucose reference range applies only to samples taken after fasting for at least 8 hours.   BUN 11 6 - 20 mg/dL   Creatinine, Ser 4.16 0.61 - 1.24 mg/dL   Calcium 9.3 8.9 -  60.6 mg/dL   GFR calc non Af Amer >60 >60 mL/min   GFR calc Af Amer >60 >60 mL/min   Anion gap 10 5 - 15    Comment: Performed at [facility], [address] [place], [place] [address]  CBC     Status: Abnormal   Collection Time: 02/29/20  6:59 AM  Result Value Ref Range   WBC 11.6 (H) 4.0 - 10.5 K/uL   RBC 5.64 4.22 - 5.81 MIL/uL   Hemoglobin 17.9 (H) 13.0 - 17.0 g/dL   HCT 10.9 (H) 32.3 - 55.7 %   MCV 93.4 80.0 - 100.0 fL   MCH 31.7 26.0 - 34.0 pg   MCHC 34.0 30.0 - 36.0 g/dL   RDW 32.2 02.5 - 42.7 %   Platelets 228 150 - 400 K/uL   nRBC 0.0 0.0 - 0.2 %    Comment: Performed at [facility], [address] [place], [place] [address]  Lipid panel     Status: Abnormal   Collection Time: 02/29/20  6:59 AM  Result Value Ref Range   Cholesterol 222 (H) 0 - 200 mg/dL   Triglycerides 628 (H) <150 mg/dL   HDL 32 (L) >31 mg/dL   Total CHOL/HDL Ratio 6.9 RATIO   VLDL 46 (H) 0 - 40 mg/dL   LDL Cholesterol 517 (H) 0 - 99 mg/dL    Comment:        Total Cholesterol/HDL:CHD Risk Coronary Heart Disease Risk Table                     Men   Women  1/2 Average Risk   3.4   3.3  Average Risk  5.0   4.4  2 X Average Risk   9.6   7.1  3 X Average Risk  23.4   11.0        Use the calculated Patient Ratio above and the CHD Risk Table to determine the patient's CHD Risk.        ATP III CLASSIFICATION (LDL):  <100     mg/dL   Optimal  941-740  mg/dL   Near or Above                    Optimal  130-159  mg/dL   Borderline  814-481  mg/dL   High  >856     mg/dL   Very High Performed at [facility], [address], [place], [place] [address]   TSH     Status: None   Collection Time: 02/29/20  6:59 AM  Result Value Ref Range   TSH 1.549 0.350 - 4.500 uIU/mL    Comment: Performed by a 3rd Generation assay with a functional sensitivity of <=0.01 uIU/mL. Performed at [facility], [address] [place], [place] [address]   Hemoglobin A1c     Status:  Abnormal   Collection Time: 02/29/20  6:59 AM  Result Value Ref Range   Hgb A1c MFr Bld 6.0 (H) 4.8 - 5.6 %    Comment: (NOTE) Pre diabetes:          5.7%-6.4% Diabetes:              >6.4% Glycemic control for   <7.0% adults with diabetes    Mean Plasma Glucose 125.5 mg/dL    Comment: Performed at [facility], [address], [place], [place] [address]  Heparin level (unfractionated)     Status: None   Collection Time: 02/29/20  6:59 AM  Result Value Ref Range   Heparin Unfractionated 0.40 0.30 - 0.70 IU/mL    Comment: (NOTE) If heparin results are below expected values, and patient dosage has  been confirmed, suggest follow up testing of antithrombin III levels. Performed at [facility], [address], [place], [place] [address]   Troponin I (High Sensitivity)     Status: Abnormal   Collection Time: 02/29/20  6:59 AM  Result Value Ref Range   Troponin I (High Sensitivity) 7,024 (HH) <18 ng/L    Comment: CRITICAL VALUE NOTED.  VALUE IS CONSISTENT WITH PREVIOUSLY REPORTED AND CALLED VALUE. (NOTE) Elevated high sensitivity troponin I (hsTnI) values and significant  changes across serial measurements may suggest ACS but many other  chronic and acute conditions are known to elevate hsTnI results.  Refer to the Links section for chest pain algorithms and additional  guidance. Performed at [facility], [address], [place], [place] [address]   Troponin I (High Sensitivity)     Status: Abnormal   Collection Time: 02/29/20  9:18 AM  Result Value Ref Range   Troponin I (High Sensitivity) 5,861 (HH) <18 ng/L    Comment: CRITICAL VALUE NOTED.  VALUE IS CONSISTENT WITH PREVIOUSLY REPORTED AND CALLED VALUE. (NOTE) Elevated high sensitivity troponin I (hsTnI) values and significant  changes across serial measurements may suggest ACS but many other  chronic and acute conditions are known to elevate hsTnI results.  Refer to the Links section for chest pain  algorithms and additional  guidance. Performed at [facility], [address] [place] [address] [place], [place] [address]     CARDIAC CATHETERIZATION  Result Date: 02/29/2020  Apical severe hypokinesis.  EF 40%.  LVEDP 10 mmHg.  Normal left main  Anatomically small LAD that stopped short of the left ventricular apex.  The mid segment is totally occluded after the origin of a large septal perforator.  Relatively long segment of total occlusion.  A small to moderate sized LAD fills by left to left collaterals.  Codominant circumflex with first obtuse marginal containing 60 and 70% stenosis proximal and distal.  RCA is codominant.  Proximal segmental 80% stenosis mid 60% stenosis and towards the distal portion of the mid segment there is a 95% stenosis.  The PDA runs the entire length of the posterior interventricular groove and wraps around the left ventricular apex. RECOMMENDATIONS:  Debate treatment strategy. A potential option is extensive stenting in the proximal to distal RCA and treat LAD and obtuse marginal disease with medical therapy.  An alternative approach would be to consider arterial grafting of LAD if technically possible as well as arterial grafting of the right coronary with medical therapy of the obtuse marginal.  Given the patient's young age, it would be worth having surgical consultation prior to considering intervention.  The interventional team will need to weigh in relative to final treatment decision.  DG CHEST PORT 1 VIEW  Result Date: 02/28/2020 CLINICAL DATA:  Chest pain EXAM: PORTABLE CHEST 1 VIEW COMPARISON:  Film from earlier in the same day. FINDINGS: Cardiac shadow is stable. Mild right basilar linear density is noted consistent with atelectasis/scarring stable from the recent exam. No new focal infiltrate is seen. No bony abnormality is noted. IMPRESSION: Stable opacity in the right base. Electronically Signed   By: Mark  Lukens M.D.   On: 02/28/2020 20:45   I personally  reviewed the cath images and concur with the findings noted above Review of Systems  Constitutional: Positive for activity change and fatigue. Negative for unexpected weight change.  HENT: Negative for trouble swallowing and voice change.   Eyes: Negative for visual disturbance.  Respiratory: Positive for wheezing.   Cardiovascular: Positive for chest pain. Negative for palpitations and leg swelling.  Gastrointestinal: Positive for abdominal pain (reflux).  Genitourinary: Negative for dysuria and hematuria.  Musculoskeletal: Negative for arthralgias and myalgias.  Neurological: Negative for syncope and weakness.  Hematological: Negative for adenopathy. Does not bruise/bleed easily.  All other systems reviewed and are negative.  Blood pressure 101/71, pulse 80, temperature 97.9 F (36.6 C), temperature source Oral, resp. rate 20, height 5' 7" (1.702 m), weight 91.4 kg, SpO2 94 %. Physical Exam  Vitals reviewed. Constitutional: He is oriented to person, place, and time. He appears well-developed and well-nourished. No distress.  HENT:  Head: Normocephalic and atraumatic.  Eyes: Conjunctivae and EOM are normal. No scleral icterus.  Neck: No thyromegaly present.  Cardiovascular: Normal rate, regular rhythm, normal heart sounds and intact distal pulses. Exam reveals no gallop and no friction rub.  No murmur heard. Respiratory: Breath sounds normal. No respiratory distress. He has no wheezes. He has no rales.  GI: Soft. He exhibits no distension.  Musculoskeletal:        General: No edema.  Lymphadenopathy:    He has no cervical adenopathy.  Neurological: He is alert and oriented to person, place, and time. No cranial nerve deficit. Coordination normal.  Skin: Skin is warm and dry.    Assessment/Plan: Sean Joseph is a 53 yo man with a past history of tobacco abuse, hypertriglyceridemia, and a strong family history of CAD. He presented with   severe 10/10 chest pain and ruled in for a  non-STEMI. He has severe 3 vessel CAD with a chronically totally occluded LAD, a critical 95% stenosis in the RCA and a 70% stenosis in OM1.  Revascularization is indicated for survival benefit and relief of symptoms. Options include CABG vs complex PCI of RCA. CABG is more likely to give a complete revascularization. LAD appears relatively small on cath but there does appear to be a graftable segment. Would plan to use bilateral IMA.  I discussed the general nature of the procedure, the need for general anesthesia, the incisions to be used, the use of cardiopulmonary bypass, and the use of drainage tubes postoperatively with Mr and Mrs Sean Joseph. We discussed the expected hospital stay, overall recovery and short and long term outcomes. I informed them of the indications, risks, benefits and alternatives. They understand the risks include, but are not limited to death, stroke, MI, DVT/PE, bleeding, possible need for transfusion, infections, cardiac arrhythmias, as well as other organ system dysfunction including respiratory, renal, or GI complications.   He accepts the risks and agrees to proceed.  Tentatively for CABG tomorrow pending discussion with Interventional Cardiology  Melrose Nakayama 02/29/2020, 4:32 PM

## 2020-02-29 NOTE — Progress Notes (Signed)
PROGRESS NOTE  Sean Joseph CVE:[id number] DOB: February 07, 1966 DOA: 02/28/2020 PCP: Darrol Jump, PA-C  HPI/Recap of past 24 hours:  On heparin drip, npo awaiting for cardiac cath C/o sinus headache Denies chest pain, no sob, no hypoxia,  No edema  Assessment/Plan: Principal Problem:   Chest pain  NSTEMI -High sensitivity troponin peak at 7000 -on heparin drip, Lopressor, aspirin, statin -Cardiac cath today, will follow cardiology recommendation  Sinus headache Flonase, as needed Tylenol  Current everyday smoker, smoking cessation education provided  Other home medication Singulair and albuterol as needed ordered  DVT Prophylaxis: On heparin drip  Code Status: Full  Family Communication: patient , and family at bedside  Disposition Plan:    Patient came from:          Home                                                                                                 Anticipated d/c place:  TBD  Barriers to d/c OR conditions which need to be met to effect a safe d/c:  Need cardiac cath   Consultants:  Cardiology  Procedures:  Cardiac cath  Antibiotics:  None   Objective: BP 125/85 (BP Location: Right Arm)   Pulse 76   Temp 98 F (36.7 C) (Oral)   Resp 17   Ht _0  (1.702 m)   Wt 91.8 kg   SpO2 94%   BMI 31.70 kg/m   Intake/Output Summary (Last 24 hours) at 02/29/2020 0952 Last data filed at 02/29/2020 0258 Gross per 24 hour  Intake 135.18 ml  Output 1350 ml  Net -1214.82 ml   Filed Weights   02/28/20 1941 02/29/20 0516  Weight: 92.7 kg 91.8 kg    Exam: Patient is examined daily including today on 02/29/2020, exams remain the same as of yesterday except that has changed    General:  NAD  Cardiovascular: RRR  Respiratory: CTABL  Abdomen: Soft/ND/NT, positive BS  Musculoskeletal: No Edema  Neuro: alert, oriented   Data Reviewed: Basic Metabolic Panel: Recent Labs  Lab 02/28/20 2040 02/29/20 0659  NA 138 138  K 4.3  4.2  CL 104 103  CO2 21* 25  GLUCOSE 103* 106*  BUN 12 11  CREATININE 0.92 0.97  CALCIUM 9.3 9.3   Liver Function Tests: Recent Labs  Lab 02/28/20 2040  AST 46*  ALT 47*  ALKPHOS 67  BILITOT 0.5  PROT 7.3  ALBUMIN 3.7   Recent Labs  Lab 02/28/20 2040  LIPASE 27   No results for input(s): AMMONIA in the last 168 hours. CBC: Recent Labs  Lab 02/28/20 2040 02/29/20 0659  WBC 11.6* 11.6*  NEUTROABS 6.2  --   HGB 17.4* 17.9*  HCT 49.5 52.7*  MCV 91.5 93.4  PLT 221 228   Cardiac Enzymes:   No results for input(s): CKTOTAL, CKMB, CKMBINDEX, TROPONINI in the last 168 hours. BNP (last 3 results) No results for input(s): BNP in the last 8760 hours.  ProBNP (last 3 results) No results for input(s): PROBNP in the last 8760 hours.  CBG: No results for  input(s): GLUCAP in the last 168 hours.  Recent Results (from the past 240 hour(s))  SARS CORONAVIRUS 2 (TAT 6-24 HRS) Nasopharyngeal Nasopharyngeal Swab     Status: None   Collection Time: 02/28/20  8:00 PM   Specimen: Nasopharyngeal Swab  Result Value Ref Range Status   SARS Coronavirus 2 NEGATIVE NEGATIVE Final    Comment: (NOTE) SARS-CoV-2 target nucleic acids are NOT DETECTED. The SARS-CoV-2 RNA is generally detectable in upper and lower respiratory specimens during the acute phase of infection. Negative results do not preclude SARS-CoV-2 infection, do not rule out co-infections with other pathogens, and should not be used as the sole basis for treatment or other patient management decisions. Negative results must be combined with clinical observations, patient history, and epidemiological information. The expected result is Negative. Fact Sheet for Patients: SugarRoll.be Fact Sheet for Healthcare Providers: https://www.woods-mathews.com/ This test is not yet approved or cleared by the [place] FDA and  has been authorized for detection and/or diagnosis of  SARS-CoV-2 by FDA under an Emergency Use Authorization (EUA). This EUA will remain  in effect (meaning this test can be used) for the duration of the COVID-19 declaration under Section 56 4(b)(1) of the Act, 21 U.S.C. section 360bbb-3(b)(1), unless the authorization is terminated or revoked sooner. Performed at [facility], [place] [address], [place], [place] [address]      Studies: DG CHEST PORT 1 VIEW  Result Date: 02/28/2020 CLINICAL DATA:  Chest pain EXAM: PORTABLE CHEST 1 VIEW COMPARISON:  Film from earlier in the same day. FINDINGS: Cardiac shadow is stable. Mild right basilar linear density is noted consistent with atelectasis/scarring stable from the recent exam. No new focal infiltrate is seen. No bony abnormality is noted. IMPRESSION: Stable opacity in the right base. Electronically Signed   By: Inez Catalina M.D.   On: 02/28/2020 20:45    Scheduled Meds: . [START ON 03/01/2020] aspirin  81 mg Oral Pre-Cath  . aspirin EC  81 mg Oral Daily  . atorvastatin  80 mg Oral q1800  . fluticasone  2 spray Each Nare Daily  . metoprolol tartrate  12.5 mg Oral BID  . sodium chloride flush  3 mL Intravenous Q12H    Continuous Infusions: . sodium chloride    . [START ON 03/01/2020] sodium chloride 3 mL/kg/hr (02/29/20 0946)   Followed by  . [START ON 03/01/2020] sodium chloride    . heparin 1,300 Units/hr (02/29/20 0042)     Time spent: 5mns I have personally reviewed and interpreted on  02/29/2020 daily labs, tele strips, imagings as discussed above under date review session and assessment and plans.  I reviewed all nursing notes, pharmacy notes, consultant notes,  vitals, pertinent old records  I have discussed plan of care as described above with RN , patient and family on 02/29/2020   FFlorencia ReasonsMD, PhD, FACP  [facility]  Available via Epic secure chat 7am-7pm for nonurgent issues Please page for urgent issues, pager number available through [employer]  .   02/29/2020, 9:52 AM  LOS: 1 day

## 2020-03-01 ENCOUNTER — Encounter: Payer: Self-pay | Admitting: Cardiology

## 2020-03-01 ENCOUNTER — Inpatient Hospital Stay (HOSPITAL_COMMUNITY): Payer: BC Managed Care – PPO | Admitting: Registered Nurse

## 2020-03-01 ENCOUNTER — Inpatient Hospital Stay (HOSPITAL_COMMUNITY): Payer: BC Managed Care – PPO

## 2020-03-01 ENCOUNTER — Encounter (HOSPITAL_COMMUNITY)
Admission: AD | Disposition: A | Discharge status: Home / Self Care | Payer: Self-pay | Source: Other Acute Inpatient Hospital | Attending: Thoracic Surgery (Cardiothoracic Vascular Surgery)

## 2020-03-01 DIAGNOSIS — Z951 Presence of aortocoronary bypass graft: Secondary | ICD-10-CM

## 2020-03-01 HISTORY — DX: Presence of aortocoronary bypass graft: Z95.1

## 2020-03-01 HISTORY — PX: TEE WITHOUT CARDIOVERSION: SHX5443

## 2020-03-01 HISTORY — PX: CORONARY ARTERY BYPASS GRAFT: SHX141

## 2020-03-01 LAB — POCT I-STAT 7, (LYTES, BLD GAS, ICA,H+H)
Acid-base deficit: 1 mmol/L (ref 0.0–2.0)
Acid-base deficit: 3 mmol/L — ABNORMAL HIGH (ref 0.0–2.0)
Acid-base deficit: 2 mmol/L (ref 0.0–2.0)
Bicarbonate: 27 mmol/L (ref 20.0–28.0)
Bicarbonate: 23.2 mmol/L (ref 20.0–28.0)
Bicarbonate: 23.8 mmol/L (ref 20.0–28.0)
Calcium, Ion: 1.28 mmol/L (ref 1.15–1.40)
Calcium, Ion: 1.17 mmol/L (ref 1.15–1.40)
Calcium, Ion: 1.15 mmol/L (ref 1.15–1.40)
HCT: 39 % (ref 39.0–52.0)
HCT: 41 % (ref 39.0–52.0)
HCT: 42 % (ref 39.0–52.0)
Hemoglobin: 13.3 g/dL (ref 13.0–17.0)
Hemoglobin: 13.9 g/dL (ref 13.0–17.0)
Hemoglobin: 14.3 g/dL (ref 13.0–17.0)
O2 Saturation: 99 %
O2 Saturation: 91 %
O2 Saturation: 90 %
Patient temperature: 36.9
Patient temperature: 36.7
Patient temperature: 36.7
Potassium: 4.8 mmol/L (ref 3.5–5.1)
Potassium: 4.5 mmol/L (ref 3.5–5.1)
Potassium: 4.6 mmol/L (ref 3.5–5.1)
Sodium: 141 mmol/L (ref 135–145)
Sodium: 142 mmol/L (ref 135–145)
Sodium: 142 mmol/L (ref 135–145)
TCO2: 29 mmol/L (ref 22–32)
TCO2: 24 mmol/L (ref 22–32)
TCO2: 25 mmol/L (ref 22–32)
pCO2 arterial: 58.4 mmHg — ABNORMAL HIGH (ref 32.0–48.0)
pCO2 arterial: 41.8 mmHg (ref 32.0–48.0)
pCO2 arterial: 43.9 mmHg (ref 32.0–48.0)
pH, Arterial: 7.272 — ABNORMAL LOW (ref 7.350–7.450)
pH, Arterial: 7.35 (ref 7.350–7.450)
pH, Arterial: 7.342 — ABNORMAL LOW (ref 7.350–7.450)
pO2, Arterial: 191 mmHg — ABNORMAL HIGH (ref 83.0–108.0)
pO2, Arterial: 63 mmHg — ABNORMAL LOW (ref 83.0–108.0)
pO2, Arterial: 61 mmHg — ABNORMAL LOW (ref 83.0–108.0)

## 2020-03-01 LAB — CBC
HCT: 49.6 % (ref 39.0–52.0)
HCT: 44.9 % (ref 39.0–52.0)
Hemoglobin: 17 g/dL (ref 13.0–17.0)
Hemoglobin: 14.9 g/dL (ref 13.0–17.0)
MCH: 31.5 pg (ref 26.0–34.0)
MCH: 31.8 pg (ref 26.0–34.0)
MCHC: 34.3 g/dL (ref 30.0–36.0)
MCHC: 33.2 g/dL (ref 30.0–36.0)
MCV: 92 fL (ref 80.0–100.0)
MCV: 95.9 fL (ref 80.0–100.0)
Platelets: 211 10*3/uL (ref 150–400)
Platelets: 145 10*3/uL — ABNORMAL LOW (ref 150–400)
RBC: 5.39 MIL/uL (ref 4.22–5.81)
RBC: 4.68 MIL/uL (ref 4.22–5.81)
RDW: 13.7 % (ref 11.5–15.5)
RDW: 14.1 % (ref 11.5–15.5)
WBC: 9.1 10*3/uL (ref 4.0–10.5)
WBC: 18.3 10*3/uL — ABNORMAL HIGH (ref 4.0–10.5)
nRBC: 0 % (ref 0.0–0.2)
nRBC: 0 % (ref 0.0–0.2)

## 2020-03-01 LAB — HEPATITIS PANEL, ACUTE
HCV Ab: NONREACTIVE
Hep A IgM: NONREACTIVE
Hep B C IgM: NONREACTIVE
Hepatitis B Surface Ag: NONREACTIVE

## 2020-03-01 LAB — COMPREHENSIVE METABOLIC PANEL
ALT: 51 U/L — ABNORMAL HIGH (ref 0–44)
AST: 44 U/L — ABNORMAL HIGH (ref 15–41)
Albumin: 3.6 g/dL (ref 3.5–5.0)
Alkaline Phosphatase: 61 U/L (ref 38–126)
Anion gap: 10 (ref 5–15)
BUN: 12 mg/dL (ref 6–20)
CO2: 21 mmol/L — ABNORMAL LOW (ref 22–32)
Calcium: 8.8 mg/dL — ABNORMAL LOW (ref 8.9–10.3)
Chloride: 108 mmol/L (ref 98–111)
Creatinine, Ser: 0.87 mg/dL (ref 0.61–1.24)
GFR calc Af Amer: >60 mL/min (ref >60)
GFR calc non Af Amer: >60 mL/min (ref >60)
Glucose, Bld: 101 mg/dL — ABNORMAL HIGH (ref 70–99)
Potassium: 4 mmol/L (ref 3.5–5.1)
Sodium: 139 mmol/L (ref 135–145)
Total Bilirubin: 0.7 mg/dL (ref 0.3–1.2)
Total Protein: 6.8 g/dL (ref 6.5–8.1)

## 2020-03-01 LAB — BASIC METABOLIC PANEL
Anion gap: 8 (ref 5–15)
BUN: 16 mg/dL (ref 6–20)
CO2: 21 mmol/L — ABNORMAL LOW (ref 22–32)
Calcium: 7.9 mg/dL — ABNORMAL LOW (ref 8.9–10.3)
Chloride: 106 mmol/L (ref 98–111)
Creatinine, Ser: 1.01 mg/dL (ref 0.61–1.24)
GFR calc Af Amer: >60 mL/min (ref >60)
GFR calc non Af Amer: >60 mL/min (ref >60)
Glucose, Bld: 141 mg/dL — ABNORMAL HIGH (ref 70–99)
Potassium: 4.5 mmol/L (ref 3.5–5.1)
Sodium: 135 mmol/L (ref 135–145)

## 2020-03-01 LAB — GLUCOSE, CAPILLARY
Glucose-Capillary: 94 mg/dL (ref 70–99)
Glucose-Capillary: 121 mg/dL — ABNORMAL HIGH (ref 70–99)
Glucose-Capillary: 138 mg/dL — ABNORMAL HIGH (ref 70–99)
Glucose-Capillary: 134 mg/dL — ABNORMAL HIGH (ref 70–99)
Glucose-Capillary: 146 mg/dL — ABNORMAL HIGH (ref 70–99)
Glucose-Capillary: 153 mg/dL — ABNORMAL HIGH (ref 70–99)
Glucose-Capillary: 154 mg/dL — ABNORMAL HIGH (ref 70–99)
Glucose-Capillary: 109 mg/dL — ABNORMAL HIGH (ref 70–99)

## 2020-03-01 LAB — ECHO INTRAOPERATIVE TEE
Height: 67 in
Weight: 3192 oz

## 2020-03-01 LAB — PROTIME-INR
INR: 1.1 (ref 0.8–1.2)
Prothrombin Time: 14 seconds (ref 11.4–15.2)

## 2020-03-01 LAB — MAGNESIUM
Magnesium: 2.3 mg/dL (ref 1.7–2.4)
Magnesium: 3.2 mg/dL — ABNORMAL HIGH (ref 1.7–2.4)

## 2020-03-01 LAB — APTT: aPTT: 53 seconds — ABNORMAL HIGH (ref 24–36)

## 2020-03-01 LAB — HEMOGLOBIN AND HEMATOCRIT, BLOOD
HCT: 36 % — ABNORMAL LOW (ref 39.0–52.0)
Hemoglobin: 12.4 g/dL — ABNORMAL LOW (ref 13.0–17.0)

## 2020-03-01 LAB — PLATELET COUNT: Platelets: 175 10*3/uL (ref 150–400)

## 2020-03-01 LAB — HEPARIN LEVEL (UNFRACTIONATED): Heparin Unfractionated: 0.23 IU/mL — ABNORMAL LOW (ref 0.30–0.70)

## 2020-03-01 SURGERY — CORONARY ARTERY BYPASS GRAFTING (CABG)
Anesthesia: General | Site: Chest

## 2020-03-01 MED ORDER — FENTANYL CITRATE (PF) 250 MCG/5ML IJ SOLN
INTRAMUSCULAR | Status: AC
  Filled 2020-03-01: qty 5

## 2020-03-01 MED ORDER — SODIUM CHLORIDE 0.9 % IV SOLN: 250.0000 mL | INTRAVENOUS | Status: DC

## 2020-03-01 MED ORDER — ACETAMINOPHEN 500 MG PO TABS
1000.0000 mg | ORAL_TABLET | Freq: Four times a day (QID) | ORAL | Status: AC
  Administered 2020-03-03 – 2020-03-06 (×13): 1000 mg via ORAL
  Filled 2020-03-01 (×13): qty 2

## 2020-03-01 MED ORDER — DEXAMETHASONE SODIUM PHOSPHATE 10 MG/ML IJ SOLN
INTRAMUSCULAR | Status: DC | PRN
  Administered 2020-03-01: 10 mg via INTRAVENOUS

## 2020-03-01 MED ORDER — LACTATED RINGERS IV SOLN: INTRAVENOUS | Status: DC | PRN

## 2020-03-01 MED ORDER — PROTAMINE SULFATE 10 MG/ML IV SOLN
INTRAVENOUS | Status: AC
  Filled 2020-03-01: qty 25

## 2020-03-01 MED ORDER — ACETAMINOPHEN 650 MG RE SUPP
650.0000 mg | Freq: Once | RECTAL | Status: AC
  Administered 2020-03-01: 650 mg via RECTAL

## 2020-03-01 MED ORDER — ALBUMIN HUMAN 5 % IV SOLN: INTRAVENOUS | Status: DC | PRN

## 2020-03-01 MED ORDER — PHENYLEPHRINE HCL-NACL 20-0.9 MG/250ML-% IV SOLN
0.0000 ug/min | INTRAVENOUS | Status: DC
  Administered 2020-03-01: 40 ug/min via INTRAVENOUS
  Filled 2020-03-01: qty 250

## 2020-03-01 MED ORDER — MAGNESIUM SULFATE 4 GM/100ML IV SOLN
INTRAVENOUS | Status: AC
  Administered 2020-03-01: 4 g via INTRAVENOUS
  Filled 2020-03-01: qty 100

## 2020-03-01 MED ORDER — SODIUM CHLORIDE 0.9% FLUSH
10.0000 mL | Freq: Two times a day (BID) | INTRAVENOUS | Status: DC
  Administered 2020-03-01: 10 mL
  Administered 2020-03-02: 40 mL
  Administered 2020-03-05 – 2020-03-06 (×2): 10 mL

## 2020-03-01 MED ORDER — ALBUMIN HUMAN 5 % IV SOLN: 250.0000 mL | INTRAVENOUS | Status: AC | PRN

## 2020-03-01 MED ORDER — ORAL CARE MOUTH RINSE
15.0000 mL | OROMUCOSAL | Status: DC
  Administered 2020-03-01: 15 mL via OROMUCOSAL

## 2020-03-01 MED ORDER — PROPOFOL 10 MG/ML IV BOLUS
INTRAVENOUS | Status: DC | PRN
  Administered 2020-03-01: 50 mg via INTRAVENOUS

## 2020-03-01 MED ORDER — ALBUTEROL SULFATE HFA 108 (90 BASE) MCG/ACT IN AERS
INHALATION_SPRAY | RESPIRATORY_TRACT | Status: DC | PRN
  Administered 2020-03-01: 6 via RESPIRATORY_TRACT

## 2020-03-01 MED ORDER — DEXMEDETOMIDINE HCL IN NACL 400 MCG/100ML IV SOLN
0.0000 ug/kg/h | INTRAVENOUS | Status: DC
  Administered 2020-03-02 – 2020-03-03 (×2): 0.2 ug/kg/h via INTRAVENOUS
  Filled 2020-03-01 (×3): qty 100

## 2020-03-01 MED ORDER — FAMOTIDINE IN NACL 20-0.9 MG/50ML-% IV SOLN
INTRAVENOUS | Status: AC
  Administered 2020-03-01: 20 mg via INTRAVENOUS
  Filled 2020-03-01: qty 50

## 2020-03-01 MED ORDER — LEVALBUTEROL HCL 1.25 MG/0.5ML IN NEBU
1.2500 mg | INHALATION_SOLUTION | Freq: Four times a day (QID) | RESPIRATORY_TRACT | Status: DC
  Administered 2020-03-01: 1.25 mg via RESPIRATORY_TRACT
  Filled 2020-03-01 (×2): qty 0.5

## 2020-03-01 MED ORDER — ASPIRIN 81 MG PO CHEW
324.0000 mg | CHEWABLE_TABLET | Freq: Every day | ORAL | Status: DC
  Administered 2020-03-09: 324 mg
  Filled 2020-03-01 (×4): qty 4

## 2020-03-01 MED ORDER — ONDANSETRON HCL 4 MG/2ML IJ SOLN
4.0000 mg | Freq: Four times a day (QID) | INTRAMUSCULAR | Status: DC | PRN
  Administered 2020-03-08: 4 mg via INTRAVENOUS

## 2020-03-01 MED ORDER — ROCURONIUM BROMIDE 10 MG/ML (PF) SYRINGE
PREFILLED_SYRINGE | INTRAVENOUS | Status: AC
  Filled 2020-03-01: qty 10

## 2020-03-01 MED ORDER — OXYCODONE HCL 5 MG PO TABS
5.0000 mg | ORAL_TABLET | ORAL | Status: DC | PRN
  Administered 2020-03-03: 5 mg via ORAL
  Administered 2020-03-03 – 2020-03-07 (×8): 10 mg via ORAL
  Administered 2020-03-11: 17:00:00 5 mg via ORAL
  Filled 2020-03-01 (×2): qty 2
  Filled 2020-03-01: qty 1
  Filled 2020-03-01 (×8): qty 2
  Filled 2020-03-01: qty 1
  Filled 2020-03-01: qty 2

## 2020-03-01 MED ORDER — MIDAZOLAM HCL 2 MG/2ML IJ SOLN
2.0000 mg | INTRAMUSCULAR | Status: DC | PRN
  Filled 2020-03-01: qty 2

## 2020-03-01 MED ORDER — MIDAZOLAM HCL 2 MG/2ML IJ SOLN
INTRAMUSCULAR | Status: AC
  Filled 2020-03-01: qty 10

## 2020-03-01 MED ORDER — LACTATED RINGERS IV SOLN: INTRAVENOUS | Status: DC

## 2020-03-01 MED ORDER — DEXTROSE 50 % IV SOLN: 0.0000 mL | INTRAVENOUS | Status: DC | PRN

## 2020-03-01 MED ORDER — ALBUTEROL SULFATE HFA 108 (90 BASE) MCG/ACT IN AERS
INHALATION_SPRAY | RESPIRATORY_TRACT | Status: AC
  Filled 2020-03-01: qty 6.7

## 2020-03-01 MED ORDER — PHENYLEPHRINE 40 MCG/ML (10ML) SYRINGE FOR IV PUSH (FOR BLOOD PRESSURE SUPPORT)
PREFILLED_SYRINGE | INTRAVENOUS | Status: AC
  Filled 2020-03-01: qty 10

## 2020-03-01 MED ORDER — MAGNESIUM SULFATE 4 GM/100ML IV SOLN: 4.0000 g | Freq: Once | INTRAVENOUS | Status: AC

## 2020-03-01 MED ORDER — NITROGLYCERIN IN D5W 200-5 MCG/ML-% IV SOLN: 0.0000 ug/min | INTRAVENOUS | Status: DC

## 2020-03-01 MED ORDER — MAGNESIUM SULFATE 50 % IJ SOLN
INTRAMUSCULAR | Status: DC | PRN
  Administered 2020-03-01: 2 g via INTRAVENOUS

## 2020-03-01 MED ORDER — FENTANYL CITRATE (PF) 250 MCG/5ML IJ SOLN
INTRAMUSCULAR | Status: AC
  Filled 2020-03-01: qty 20

## 2020-03-01 MED ORDER — CHLORHEXIDINE GLUCONATE 0.12 % MT SOLN
15.0000 mL | OROMUCOSAL | Status: AC
  Administered 2020-03-01: 15 mL via OROMUCOSAL

## 2020-03-01 MED ORDER — HEPARIN SODIUM (PORCINE) 1000 UNIT/ML IJ SOLN
INTRAMUSCULAR | Status: AC
  Filled 2020-03-01: qty 1

## 2020-03-01 MED ORDER — SODIUM CHLORIDE 0.9 % IR SOLN
Status: DC | PRN
  Administered 2020-03-01: 6000 mL

## 2020-03-01 MED ORDER — SODIUM CHLORIDE 0.9% FLUSH: 3.0000 mL | INTRAVENOUS | Status: DC | PRN

## 2020-03-01 MED ORDER — LACTATED RINGERS IV SOLN: 500.0000 mL | Freq: Once | INTRAVENOUS | Status: DC | PRN

## 2020-03-01 MED ORDER — LEVALBUTEROL HCL 1.25 MG/0.5ML IN NEBU
1.2500 mg | INHALATION_SOLUTION | Freq: Four times a day (QID) | RESPIRATORY_TRACT | Status: DC
  Administered 2020-03-02 – 2020-03-05 (×14): 1.25 mg via RESPIRATORY_TRACT
  Filled 2020-03-01 (×14): qty 0.5

## 2020-03-01 MED ORDER — SODIUM CHLORIDE 0.9 % IV SOLN
1.5000 g | Freq: Two times a day (BID) | INTRAVENOUS | Status: AC
  Administered 2020-03-01 – 2020-03-03 (×4): 1.5 g via INTRAVENOUS
  Filled 2020-03-01 (×4): qty 1.5

## 2020-03-01 MED ORDER — METOPROLOL TARTRATE 5 MG/5ML IV SOLN
2.5000 mg | INTRAVENOUS | Status: DC | PRN
  Filled 2020-03-01: qty 5

## 2020-03-01 MED ORDER — LIDOCAINE 2% (20 MG/ML) 5 ML SYRINGE
INTRAMUSCULAR | Status: AC
  Filled 2020-03-01: qty 5

## 2020-03-01 MED ORDER — ACETAMINOPHEN 160 MG/5ML PO SOLN: 1000.0000 mg | Freq: Four times a day (QID) | ORAL | Status: AC

## 2020-03-01 MED ORDER — CALCIUM CHLORIDE 10 % IV SOLN
INTRAVENOUS | Status: DC | PRN
  Administered 2020-03-01 (×4): 250 mg via INTRAVENOUS

## 2020-03-01 MED ORDER — PHENYLEPHRINE 40 MCG/ML (10ML) SYRINGE FOR IV PUSH (FOR BLOOD PRESSURE SUPPORT)
PREFILLED_SYRINGE | INTRAVENOUS | Status: DC | PRN
  Administered 2020-03-01 (×3): 120 ug via INTRAVENOUS

## 2020-03-01 MED ORDER — BISACODYL 10 MG RE SUPP: 10.0000 mg | Freq: Every day | RECTAL | Status: DC

## 2020-03-01 MED ORDER — SODIUM CHLORIDE (PF) 0.9 % IJ SOLN
OROMUCOSAL | Status: DC | PRN
  Administered 2020-03-01: 09:00:00 4 mL via TOPICAL

## 2020-03-01 MED ORDER — FAMOTIDINE IN NACL 20-0.9 MG/50ML-% IV SOLN: 20.0000 mg | Freq: Two times a day (BID) | INTRAVENOUS | Status: AC

## 2020-03-01 MED ORDER — PANTOPRAZOLE SODIUM 40 MG PO TBEC
40.0000 mg | DELAYED_RELEASE_TABLET | Freq: Every day | ORAL | Status: DC
  Administered 2020-03-03 – 2020-03-14 (×12): 40 mg via ORAL
  Filled 2020-03-01 (×12): qty 1

## 2020-03-01 MED ORDER — ORAL CARE MOUTH RINSE
15.0000 mL | Freq: Two times a day (BID) | OROMUCOSAL | Status: DC
  Administered 2020-03-01: 15 mL via OROMUCOSAL

## 2020-03-01 MED ORDER — METOPROLOL TARTRATE 25 MG/10 ML ORAL SUSPENSION: 12.5000 mg | Freq: Two times a day (BID) | ORAL | Status: DC

## 2020-03-01 MED ORDER — SODIUM CHLORIDE 0.9 % IV SOLN
INTRAVENOUS | Status: DC | PRN
  Administered 2020-03-01: 13:00:00 750 mg via INTRAVENOUS

## 2020-03-01 MED ORDER — METHYLPREDNISOLONE SODIUM SUCC 125 MG IJ SOLR
80.0000 mg | Freq: Once | INTRAMUSCULAR | Status: AC
  Administered 2020-03-01: 80 mg via INTRAVENOUS
  Filled 2020-03-01: qty 2

## 2020-03-01 MED ORDER — FENTANYL CITRATE (PF) 250 MCG/5ML IJ SOLN
INTRAMUSCULAR | Status: DC | PRN
  Administered 2020-03-01: 50 ug via INTRAVENOUS
  Administered 2020-03-01: 100 ug via INTRAVENOUS
  Administered 2020-03-01: 600 ug via INTRAVENOUS
  Administered 2020-03-01: 100 ug via INTRAVENOUS

## 2020-03-01 MED ORDER — ROCURONIUM BROMIDE 10 MG/ML (PF) SYRINGE
PREFILLED_SYRINGE | INTRAVENOUS | Status: DC | PRN
  Administered 2020-03-01: 50 mg via INTRAVENOUS
  Administered 2020-03-01: 30 mg via INTRAVENOUS
  Administered 2020-03-01: 40 mg via INTRAVENOUS
  Administered 2020-03-01: 60 mg via INTRAVENOUS
  Administered 2020-03-01: 50 mg via INTRAVENOUS

## 2020-03-01 MED ORDER — CHLORHEXIDINE GLUCONATE 0.12% ORAL RINSE (MEDLINE KIT): 15.0000 mL | Freq: Two times a day (BID) | OROMUCOSAL | Status: DC

## 2020-03-01 MED ORDER — SODIUM CHLORIDE 0.9 % IV SOLN: INTRAVENOUS | Status: DC

## 2020-03-01 MED ORDER — MIDAZOLAM HCL 5 MG/5ML IJ SOLN
INTRAMUSCULAR | Status: DC | PRN
  Administered 2020-03-01 (×3): 2 mg via INTRAVENOUS
  Administered 2020-03-01: 3 mg via INTRAVENOUS
  Administered 2020-03-01: 1 mg via INTRAVENOUS

## 2020-03-01 MED ORDER — METOPROLOL TARTRATE 12.5 MG HALF TABLET
12.5000 mg | ORAL_TABLET | Freq: Two times a day (BID) | ORAL | Status: DC
  Administered 2020-03-03 (×2): 12.5 mg via ORAL
  Filled 2020-03-01 (×3): qty 1

## 2020-03-01 MED ORDER — PROPOFOL 10 MG/ML IV BOLUS
INTRAVENOUS | Status: AC
  Filled 2020-03-01: qty 20

## 2020-03-01 MED ORDER — DOCUSATE SODIUM 100 MG PO CAPS
200.0000 mg | ORAL_CAPSULE | Freq: Every day | ORAL | Status: DC
  Administered 2020-03-03 – 2020-03-05 (×3): 200 mg via ORAL
  Filled 2020-03-01 (×4): qty 2

## 2020-03-01 MED ORDER — SODIUM CHLORIDE 0.45 % IV SOLN: INTRAVENOUS | Status: DC | PRN

## 2020-03-01 MED ORDER — CHLORHEXIDINE GLUCONATE CLOTH 2 % EX PADS
6.0000 | MEDICATED_PAD | Freq: Every day | CUTANEOUS | Status: DC
  Administered 2020-03-02 – 2020-03-06 (×3): 6 via TOPICAL

## 2020-03-01 MED ORDER — SODIUM CHLORIDE 0.9% FLUSH: 10.0000 mL | INTRAVENOUS | Status: DC | PRN

## 2020-03-01 MED ORDER — POTASSIUM CHLORIDE 10 MEQ/50ML IV SOLN: 10.0000 meq | INTRAVENOUS | Status: AC

## 2020-03-01 MED ORDER — INSULIN REGULAR(HUMAN) IN NACL 100-0.9 UT/100ML-% IV SOLN: INTRAVENOUS | Status: DC

## 2020-03-01 MED ORDER — ACETAMINOPHEN 160 MG/5ML PO SOLN: 650.0000 mg | Freq: Once | ORAL | Status: AC

## 2020-03-01 MED ORDER — CALCIUM CHLORIDE 10 % IV SOLN
INTRAVENOUS | Status: AC
  Filled 2020-03-01: qty 10

## 2020-03-01 MED ORDER — SODIUM CHLORIDE 0.9% FLUSH
3.0000 mL | Freq: Two times a day (BID) | INTRAVENOUS | Status: DC
  Administered 2020-03-02 – 2020-03-06 (×7): 3 mL via INTRAVENOUS

## 2020-03-01 MED ORDER — ASPIRIN EC 325 MG PO TBEC
325.0000 mg | DELAYED_RELEASE_TABLET | Freq: Every day | ORAL | Status: DC
  Administered 2020-03-03 – 2020-03-14 (×11): 325 mg via ORAL
  Filled 2020-03-01 (×12): qty 1

## 2020-03-01 MED ORDER — HEPARIN SODIUM (PORCINE) 1000 UNIT/ML IJ SOLN
INTRAMUSCULAR | Status: DC | PRN
  Administered 2020-03-01: 2000 [IU] via INTRAVENOUS
  Administered 2020-03-01: 30000 [IU] via INTRAVENOUS

## 2020-03-01 MED ORDER — MORPHINE SULFATE (PF) 2 MG/ML IV SOLN
1.0000 mg | INTRAVENOUS | Status: DC | PRN
  Administered 2020-03-01 (×6): 2 mg via INTRAVENOUS
  Administered 2020-03-01: 3 mg via INTRAVENOUS
  Administered 2020-03-02: 4 mg via INTRAVENOUS
  Administered 2020-03-02: 2 mg via INTRAVENOUS
  Administered 2020-03-02: 4 mg via INTRAVENOUS
  Administered 2020-03-02: 3 mg via INTRAVENOUS
  Administered 2020-03-02: 2 mg via INTRAVENOUS
  Administered 2020-03-02 (×2): 4 mg via INTRAVENOUS
  Administered 2020-03-02: 2 mg via INTRAVENOUS
  Administered 2020-03-02: 4 mg via INTRAVENOUS
  Administered 2020-03-02: 2 mg via INTRAVENOUS
  Administered 2020-03-02 – 2020-03-03 (×7): 4 mg via INTRAVENOUS
  Administered 2020-03-03: 2 mg via INTRAVENOUS
  Administered 2020-03-03 – 2020-03-06 (×2): 4 mg via INTRAVENOUS
  Filled 2020-03-01 (×2): qty 2
  Filled 2020-03-01: qty 1
  Filled 2020-03-01 (×2): qty 2
  Filled 2020-03-01: qty 1
  Filled 2020-03-01 (×4): qty 2
  Filled 2020-03-01 (×2): qty 1
  Filled 2020-03-01: qty 2
  Filled 2020-03-01: qty 1
  Filled 2020-03-01 (×3): qty 2
  Filled 2020-03-01 (×2): qty 1
  Filled 2020-03-01 (×4): qty 2
  Filled 2020-03-01 (×4): qty 1

## 2020-03-01 MED ORDER — BISACODYL 5 MG PO TBEC
10.0000 mg | DELAYED_RELEASE_TABLET | Freq: Every day | ORAL | Status: DC
  Administered 2020-03-03 – 2020-03-05 (×3): 10 mg via ORAL
  Filled 2020-03-01 (×5): qty 2

## 2020-03-01 MED ORDER — LIDOCAINE 2% (20 MG/ML) 5 ML SYRINGE
INTRAMUSCULAR | Status: DC | PRN
  Administered 2020-03-01: 40 mg via INTRAVENOUS

## 2020-03-01 MED ORDER — IPRATROPIUM-ALBUTEROL 0.5-2.5 (3) MG/3ML IN SOLN
3.0000 mL | Freq: Four times a day (QID) | RESPIRATORY_TRACT | Status: DC | PRN
  Administered 2020-03-01 (×2): 3 mL via RESPIRATORY_TRACT
  Filled 2020-03-01 (×2): qty 3

## 2020-03-01 MED ORDER — VANCOMYCIN HCL IN DEXTROSE 1-5 GM/200ML-% IV SOLN
1000.0000 mg | Freq: Once | INTRAVENOUS | Status: AC
  Administered 2020-03-01: 1000 mg via INTRAVENOUS
  Filled 2020-03-01: qty 200

## 2020-03-01 MED ORDER — PROTAMINE SULFATE 10 MG/ML IV SOLN
INTRAVENOUS | Status: DC | PRN
  Administered 2020-03-01: 250 mg via INTRAVENOUS

## 2020-03-01 SURGICAL SUPPLY — 116 items
APPLIER CLIP 9.375 SM OPEN (CLIP)
BAG DECANTER FOR FLEXI CONT (MISCELLANEOUS) ×5 IMPLANT
BASKET HEART  (ORDER IN 25'S) (MISCELLANEOUS) ×1
BASKET HEART (ORDER IN 25'S) (MISCELLANEOUS) ×1
BASKET HEART (ORDER IN 25S) (MISCELLANEOUS) ×3 IMPLANT
BLADE CLIPPER SURG (BLADE) ×5 IMPLANT
BLADE STERNUM SYSTEM 6 (BLADE) ×5 IMPLANT
BLADE SURG 15 STRL LF DISP TIS (BLADE) ×1 IMPLANT
BLADE SURG 15 STRL SS (BLADE) ×2
BNDG ELASTIC 4X5.8 VLCR STR LF (GAUZE/BANDAGES/DRESSINGS) ×5 IMPLANT
BNDG ELASTIC 6X15 VLCR STRL LF (GAUZE/BANDAGES/DRESSINGS) ×3 IMPLANT
BNDG ELASTIC 6X5.8 VLCR STR LF (GAUZE/BANDAGES/DRESSINGS) ×5 IMPLANT
BNDG GAUZE ELAST 4 BULKY (GAUZE/BANDAGES/DRESSINGS) ×5 IMPLANT
CANISTER SUCT 3000ML PPV (MISCELLANEOUS) ×5 IMPLANT
CANNULA EZ GLIDE AORTIC 21FR (CANNULA) ×5 IMPLANT
CATH CPB KIT HENDRICKSON (MISCELLANEOUS) ×5 IMPLANT
CATH ROBINSON RED A/P 18FR (CATHETERS) ×5 IMPLANT
CATH THORACIC 36FR (CATHETERS) ×5 IMPLANT
CATH THORACIC 36FR RT ANG (CATHETERS) ×5 IMPLANT
CLIP APPLIE 9.375 SM OPEN (CLIP) IMPLANT
CLIP VESOCCLUDE MED 24/CT (CLIP) ×3 IMPLANT
CLIP VESOCCLUDE SM WIDE 24/CT (CLIP) ×18 IMPLANT
CONN ST 1/4X3/8  BEN (MISCELLANEOUS) ×4
CONN ST 1/4X3/8 BEN (MISCELLANEOUS) ×2 IMPLANT
COVER MAYO STAND STRL (DRAPES) ×3 IMPLANT
CUFF TOURN SGL QUICK 18X4 (TOURNIQUET CUFF) IMPLANT
CUFF TOURN SGL QUICK 24 (TOURNIQUET CUFF)
CUFF TRNQT CYL 24X4X16.5-23 (TOURNIQUET CUFF) IMPLANT
DERMABOND ADVANCED (GAUZE/BANDAGES/DRESSINGS) ×2
DERMABOND ADVANCED .7 DNX12 (GAUZE/BANDAGES/DRESSINGS) ×1 IMPLANT
DRAIN CHANNEL 28F RND 3/8 FF (WOUND CARE) ×6 IMPLANT
DRAPE CARDIOVASCULAR INCISE (DRAPES) ×2
DRAPE EXTREMITY T 121X128X90 (DISPOSABLE) ×5 IMPLANT
DRAPE HALF SHEET 40X57 (DRAPES) IMPLANT
DRAPE SLUSH/WARMER DISC (DRAPES) ×5 IMPLANT
DRAPE SRG 135X102X78XABS (DRAPES) ×3 IMPLANT
DRSG COVADERM 4X14 (GAUZE/BANDAGES/DRESSINGS) ×5 IMPLANT
ELECT REM PT RETURN 9FT ADLT (ELECTROSURGICAL) ×10
ELECTRODE REM PT RTRN 9FT ADLT (ELECTROSURGICAL) ×6 IMPLANT
FELT TEFLON 1X6 (MISCELLANEOUS) ×7 IMPLANT
GAUZE SPONGE 4X4 12PLY STRL (GAUZE/BANDAGES/DRESSINGS) ×7 IMPLANT
GAUZE SPONGE 4X4 12PLY STRL LF (GAUZE/BANDAGES/DRESSINGS) ×3 IMPLANT
GEL ULTRASOUND 20GR AQUASONIC (MISCELLANEOUS) IMPLANT
GLOVE BIO SURGEON STRL SZ 6.5 (GLOVE) ×6 IMPLANT
GLOVE BIO SURGEON STRL SZ7 (GLOVE) ×3 IMPLANT
GLOVE BIO SURGEON STRL SZ7.5 (GLOVE) ×3 IMPLANT
GLOVE BIO SURGEONS STRL SZ 6.5 (GLOVE) ×3
GLOVE BIOGEL PI IND STRL 6 (GLOVE) ×1 IMPLANT
GLOVE BIOGEL PI IND STRL 6.5 (GLOVE) ×2 IMPLANT
GLOVE BIOGEL PI IND STRL 7.5 (GLOVE) ×1 IMPLANT
GLOVE BIOGEL PI IND STRL 8 (GLOVE) ×2 IMPLANT
GLOVE BIOGEL PI INDICATOR 6 (GLOVE) ×2
GLOVE BIOGEL PI INDICATOR 6.5 (GLOVE) ×4
GLOVE BIOGEL PI INDICATOR 7.5 (GLOVE) ×2
GLOVE BIOGEL PI INDICATOR 8 (GLOVE) ×4
GLOVE SURG SIGNA 7.5 PF LTX (GLOVE) ×18 IMPLANT
GOWN STRL REUS W/ TWL LRG LVL3 (GOWN DISPOSABLE) ×12 IMPLANT
GOWN STRL REUS W/ TWL XL LVL3 (GOWN DISPOSABLE) ×6 IMPLANT
GOWN STRL REUS W/TWL LRG LVL3 (GOWN DISPOSABLE) ×8
GOWN STRL REUS W/TWL XL LVL3 (GOWN DISPOSABLE) ×4
HEMOSTAT POWDER SURGIFOAM 1G (HEMOSTASIS) ×15 IMPLANT
HEMOSTAT SURGICEL 2X14 (HEMOSTASIS) ×8 IMPLANT
INSERT FOGARTY XLG (MISCELLANEOUS) IMPLANT
KIT BASIN OR (CUSTOM PROCEDURE TRAY) ×5 IMPLANT
KIT SUCTION CATH 14FR (SUCTIONS) ×10 IMPLANT
KIT TURNOVER KIT B (KITS) ×5 IMPLANT
KIT VASOVIEW HEMOPRO 2 VH 4000 (KITS) ×5 IMPLANT
MARKER GRAFT CORONARY BYPASS (MISCELLANEOUS) ×15 IMPLANT
NS IRRIG 1000ML POUR BTL (IV SOLUTION) ×28 IMPLANT
PACK E OPEN HEART (SUTURE) ×5 IMPLANT
PACK OPEN HEART (CUSTOM PROCEDURE TRAY) ×5 IMPLANT
PAD ARMBOARD 7.5X6 YLW CONV (MISCELLANEOUS) ×10 IMPLANT
PAD ELECT DEFIB RADIOL ZOLL (MISCELLANEOUS) ×5 IMPLANT
PENCIL BUTTON HOLSTER BLD 10FT (ELECTRODE) ×5 IMPLANT
POSITIONER HEAD DONUT 9IN (MISCELLANEOUS) ×5 IMPLANT
PUNCH AORTIC ROTATE 4.0MM (MISCELLANEOUS) ×3 IMPLANT
PUNCH AORTIC ROTATE 4.5MM 8IN (MISCELLANEOUS) IMPLANT
PUNCH AORTIC ROTATE 5MM 8IN (MISCELLANEOUS) IMPLANT
SET CARDIOPLEGIA MPS 5001102 (MISCELLANEOUS) ×3 IMPLANT
SHEARS HARMONIC 9CM CVD (BLADE) ×5 IMPLANT
SPONGE LAP 18X18 RF (DISPOSABLE) ×3 IMPLANT
SPONGE LAP 4X18 RFD (DISPOSABLE) ×3 IMPLANT
SUT BONE WAX W31G (SUTURE) ×5 IMPLANT
SUT MNCRL AB 4-0 PS2 18 (SUTURE) IMPLANT
SUT PROLENE 3 0 SH DA (SUTURE) ×5 IMPLANT
SUT PROLENE 4 0 RB 1 (SUTURE) ×2
SUT PROLENE 4 0 SH DA (SUTURE) ×6 IMPLANT
SUT PROLENE 4-0 RB1 .5 CRCL 36 (SUTURE) ×1 IMPLANT
SUT PROLENE 5 0 C 1 36 (SUTURE) ×3 IMPLANT
SUT PROLENE 6 0 C 1 30 (SUTURE) ×13 IMPLANT
SUT PROLENE 7 0 BV1 MDA (SUTURE) ×14 IMPLANT
SUT PROLENE 8 0 BV175 6 (SUTURE) ×6 IMPLANT
SUT SILK  1 MH (SUTURE) ×2
SUT SILK 1 MH (SUTURE) ×1 IMPLANT
SUT STEEL 6MS V (SUTURE) ×5 IMPLANT
SUT STEEL STERNAL CCS#1 18IN (SUTURE) IMPLANT
SUT STEEL SZ 6 DBL 3X14 BALL (SUTURE) ×5 IMPLANT
SUT VIC AB 1 CTX 36 (SUTURE) ×4
SUT VIC AB 1 CTX36XBRD ANBCTR (SUTURE) ×6 IMPLANT
SUT VIC AB 2-0 CT1 27 (SUTURE) ×2
SUT VIC AB 2-0 CT1 TAPERPNT 27 (SUTURE) ×1 IMPLANT
SUT VIC AB 2-0 CTX 27 (SUTURE) IMPLANT
SUT VIC AB 3-0 SH 27 (SUTURE)
SUT VIC AB 3-0 SH 27X BRD (SUTURE) IMPLANT
SUT VIC AB 3-0 X1 27 (SUTURE) IMPLANT
SUT VICRYL 4-0 PS2 18IN ABS (SUTURE) IMPLANT
SYR 50ML SLIP (SYRINGE) IMPLANT
SYSTEM SAHARA CHEST DRAIN ATS (WOUND CARE) ×5 IMPLANT
TAPE CLOTH SURG 4X10 WHT LF (GAUZE/BANDAGES/DRESSINGS) ×3 IMPLANT
TAPE PAPER 2X10 WHT MICROPORE (GAUZE/BANDAGES/DRESSINGS) ×3 IMPLANT
TOWEL GREEN STERILE (TOWEL DISPOSABLE) ×5 IMPLANT
TOWEL GREEN STERILE FF (TOWEL DISPOSABLE) ×5 IMPLANT
TRAY FOLEY SLVR 16FR TEMP STAT (SET/KITS/TRAYS/PACK) ×5 IMPLANT
TUBING LAP HI FLOW INSUFFLATIO (TUBING) ×5 IMPLANT
UNDERPAD 30X30 (UNDERPADS AND DIAPERS) ×5 IMPLANT
WATER STERILE IRR 1000ML POUR (IV SOLUTION) ×10 IMPLANT

## 2020-03-01 NOTE — Brief Op Note (Signed)
02/28/2020 - 03/01/2020  2:15 PM  PATIENT:  [name]  54 y.o. male  PRE-OPERATIVE DIAGNOSIS:  CAD  POST-OPERATIVE DIAGNOSIS:  CAD  PROCEDURE:  Procedure(s): CORONARY ARTERY BYPASS GRAFTING (CABG) X 3, USING BILATERAL MAMMORY ARTERIES, RIGHT LEG GREATER SAPHENOUS VEIN HARVESTED. (N/A) TRANSESOPHAGEAL ECHOCARDIOGRAM (TEE) (N/A) LIMA-LAD FREE RIMA-RCA SVG-OM1  SURGEON:  Surgeon(s) and Role:    * Loreli Slot, MD - Primary  PHYSICIAN ASSISTANT: WAYNE GOLD PA-C  ANESTHESIA:   general  EBL:  590 mL   BLOOD ADMINISTERED:none  DRAINS: PLEURAL AND MEDIASTINAL CHEST TUBES  LOCAL MEDICATIONS USED:  NONE  SPECIMEN:  No Specimen  DISPOSITION OF SPECIMEN:  N/A  COUNTS:  YES  TOURNIQUET:  * No tourniquets in log *  DICTATION: .Other Dictation: Dictation Number PENDING  PLAN OF CARE: Admit to inpatient   PATIENT DISPOSITION:  ICU - intubated and hemodynamically stable.   Delay start of Pharmacological VTE agent (>24hrs) due to surgical blood loss or risk of bleeding: yes  COMPLICATIONS: NO KNOWN  IMA and vein good quality RCA and OM good quality, LAD deeply intramyocardial Severe bullous semphysema

## 2020-03-01 NOTE — Interval H&P Note (Signed)
History and Physical Interval Note:  03/01/2020 7:22 AM  Sean Joseph  has presented today for surgery, with the diagnosis of CAD.  The various methods of treatment have been discussed with the patient and family. After consideration of risks, benefits and other options for treatment, the patient has consented to  Procedure(s) with comments: CORONARY ARTERY BYPASS GRAFTING (CABG) (N/A) - POSSIBLE BILATERAL IMA RADIAL ARTERY HARVEST (Left) TRANSESOPHAGEAL ECHOCARDIOGRAM (TEE) (N/A) as a surgical intervention.  The patient's history has been reviewed, patient examined, no change in status, stable for surgery.  I have reviewed the patient's chart and labs.  Questions were answered to the patient's satisfaction.     Loreli Slot

## 2020-03-01 NOTE — Anesthesia Postprocedure Evaluation (Signed)
Anesthesia Post Note  Patient: Sean Joseph  Procedure(s) Performed: CORONARY ARTERY BYPASS GRAFTING (CABG) X 3, USING BILATERAL MAMMORY ARTERIES, RIGHT LEG GREATER SAPHENOUS VEIN HARVESTED. (N/A Chest) TRANSESOPHAGEAL ECHOCARDIOGRAM (TEE) (N/A )     Patient location during evaluation: ICU Anesthesia Type: General Level of consciousness: awake and alert Pain management: pain level controlled Vital Signs Assessment: post-procedure vital signs reviewed and stable Respiratory status: spontaneous breathing, nonlabored ventilation, respiratory function stable and patient connected to nasal cannula oxygen Cardiovascular status: blood pressure returned to baseline and stable Postop Assessment: no apparent nausea or vomiting Anesthetic complications: no    Last Vitals:  Vitals:   03/01/20 2130 03/01/20 2145  BP: 104/77   Pulse: 73 72  Resp: 20 18  Temp: 36.8 C 36.8 C  SpO2: 96% 94%    Last Pain:  Vitals:   03/01/20 2000  TempSrc: Core  PainSc:                  Catheryn Bacon Harlea Goetzinger

## 2020-03-01 NOTE — Progress Notes (Signed)
  Echocardiogram Echocardiogram Transesophageal has been performed.  Delcie Roch 03/01/2020, 2:50 PM

## 2020-03-01 NOTE — Progress Notes (Signed)
CT surgery p.m. Rounds  Patient status post multivessel CABG earlier today Patient with history of smoking P O2 postextubation 1 hour 62 with saturation 90% Scattered wheezing on exam-Xopenex nebulized therapy ordered Check ABG with a.m. labs Hemodynamic stable, not bleeding

## 2020-03-01 NOTE — Procedures (Signed)
Extubation Procedure Note  Patient Details:   Name: Sean Joseph DOB: 12/02/1965 MRN: [id number]   Airway Documentation:    Vent end date: 03/01/20 Vent end time: 1652   Evaluation  O2 sats: stable throughout Complications: No apparent complications Patient did tolerate procedure well. Bilateral Breath Sounds: Clear, Diminished   Yes   Patient extubated to 4lnc. Vitals stable at this time. No complications. Family and RN at bedside. RT will continue to monitor.  Ave Filter 03/01/2020, 5:01 PM

## 2020-03-01 NOTE — Anesthesia Procedure Notes (Signed)
Central Venous Catheter Insertion Performed by: Annye Asa, MD, anesthesiologist Start/End4/16/2021 6:31 AM, 03/01/2020 6:42 AM Patient location: Pre-op. Preanesthetic checklist: patient identified, IV checked, risks and benefits discussed, surgical consent, monitors and equipment checked, pre-op evaluation, timeout performed and anesthesia consent Position: supine Lidocaine 1% used for infiltration and patient sedated Hand hygiene performed , maximum sterile barriers used  and Seldinger technique used Catheter size: 8.5 Fr PA cath was placed.Sheath introducer Swan type:thermodilution Procedure performed using ultrasound guided technique. Ultrasound Notes:anatomy identified, needle tip was noted to be adjacent to the nerve/plexus identified, no ultrasound evidence of intravascular and/or intraneural injection and image(s) printed for medical record Attempts: 1 Following insertion, line sutured, dressing applied and Biopatch. Post procedure assessment: blood return through all ports, free fluid flow and no air  Patient tolerated the procedure well with no immediate complications. Additional procedure comments: PA catheter:  Routine monitors. Timeout, sterile prep, drape, FBP R neck.  Supine position.  1% Lido local, finder and trocar RIJ 1st pass with US guidance.  Cordis placed over J wire. PA catheter in easily.  Sterile dressing applied.  Patient tolerated well, VSS.  Jenita Seashore, MD.

## 2020-03-01 NOTE — Anesthesia Procedure Notes (Signed)
Procedure Name: Intubation Date/Time: 03/01/2020 7:49 AM Performed by: Trinna Post., CRNA Pre-anesthesia Checklist: Patient identified, Emergency Drugs available, Suction available, Patient being monitored and Timeout performed Patient Re-evaluated:Patient Re-evaluated prior to induction Oxygen Delivery Method: Circle system utilized Preoxygenation: Pre-oxygenation with 100% oxygen Induction Type: IV induction Ventilation: Mask ventilation without difficulty and Two handed mask ventilation required Laryngoscope Size: Mac and 4 Grade View: Grade I Tube type: Oral Tube size: 8.0 mm Number of attempts: 1 Airway Equipment and Method: Stylet Placement Confirmation: ETT inserted through vocal cords under direct vision,  positive ETCO2 and breath sounds checked- equal and bilateral Secured at: 22 cm Tube secured with: Tape Dental Injury: Teeth and Oropharynx as per pre-operative assessment

## 2020-03-01 NOTE — Op Note (Signed)
Sean, Joseph MEDICAL RECORD [id number] ACCOUNT [id number] DATE OF BIRTH:[birth year] FACILITY: [facility] LOCATION: [facility] PHYSICIAN:Merion Caton Lars Pinks, MD  OPERATIVE REPORT  DATE OF PROCEDURE:  03/01/2020  PREOPERATIVE DIAGNOSIS:  Three-vessel coronary artery disease, status post non-ST elevation myocardial infarction.  POSTOPERATIVE DIAGNOSIS:  Three-vessel coronary artery disease, status post non-ST elevation myocardial  infarction.  PROCEDURES:   1.  Median sternotomy.  2.  Extracorporeal circulation. 3.  Coronary artery bypass grafting x 3  Left internal mammary artery to left anterior descending,  Saphenous vein graft to obtuse marginal 1,  Free right internal mammary artery to distal right coronary. 4. Endoscopic vein harvest right thigh  SURGEON:  Charlett Lango, MD  ASSISTANT:  Gershon Crane, PA-C  ANESTHESIA:  General.  FINDINGS:  Severe bullous emphysema bilaterally.  Mammary artery good quality.  Vein good quality.  LAD deeply intramyocardial.  OM, posterior descending, and distal right coronary good quality targets.  CLINICAL NOTE:  Sean Joseph is a 54 year old gentleman with a strong family history of coronary artery disease and a history of tobacco abuse.  He presented with unstable chest pain and ruled in for myocardial infarction.  At catheterization, he had a  totally occluded LAD.  There was a critical stenosis in the right coronary.  There was moderate disease in OM1.  He was referred for coronary artery bypass grafting.  The indications, risks, benefits, and alternatives were discussed in detail with the  patient.  He understood and accepted the risks and agreed to proceed.  DESCRIPTION OF PROCEDURE:  Sean Joseph was brought to the preoperative holding area on 03/01/2020.  Anesthesia placed a Swan-Ganz catheter and an arterial blood pressure monitoring line.  He was taken to the operating room, anesthetized and intubated.   Intravenous antibiotics were  administered.  A Foley catheter was placed.  The chest, abdomen and legs were prepped and draped in the usual sterile fashion.  Transesophageal echocardiography was performed by Dr. Bradley Ferris of the anesthesia service.  Please  refer to his separately dictated note for full details.  Ejection fraction was 50%.  There was no hemodynamically significant valvular pathology.  A timeout was performed.  A median sternotomy was performed.  The left and right internal mammary arteries were harvested using standard technique.  Simultaneously with the mammary harvest, an incision was made in the medial aspect of the right leg and  the greater saphenous vein was harvested from the right thigh endoscopically.  Saphenous vein and mammary arteries were good quality vessels.  The mammary takedown was extremely difficult due to severe bullous emphysema and over-inflation of the lungs.   This necessitated holding ventilation at various times, but again the mammary arteries were good quality once harvested.  It was clear that due to the bullous emphysema in the right lung, the right mammary would not reach as a pedicle graft.  Therefore,  it was transected proximally and the proximal stump was suture ligated with a 2-0 silk suture.  A sternal retractor was placed.  The remainder of a full heparin dose was given.  The pericardium was opened.  The ascending aorta was inspected.  There was no atherosclerotic disease evident.  The aorta was cannulated via concentric 2-0 Ethibond pledgeted  pursestring sutures.  A dual-stage venous cannula was placed via a pursestring suture in the right atrium.  Cardiopulmonary bypass was initiated.  Flows were maintained per protocol.  Anticoagulation was monitored with ACT measurement.  The patient was  cooled to 32 degrees Celsius.  The coronary  arteries were inspected.  The LAD was deeply intramyocardial and was not dissected out at this time.  The conduits were inspected and cut to length.   A foam pad was placed in the pericardium.  A temperature  probe was placed in the myocardial septum and a cardioplegia cannula was placed in the ascending aorta.  The aorta was crossclamped.  The left ventricle was emptied via the aortic root vent.  Cardiac arrest was achieved with a combination of cold antegrade blood cardioplegia and topical iced saline.  One liter of cardioplegia was administered.  There was a  rapid diastolic arrest and there was septal cooling to 12 degrees Celsius.  A reversed saphenous vein graft was placed end-to-side to the first obtuse marginal branch of the left circumflex.  The OM had approximately a 70% proximal stenosis.  It was a good quality vessel at the site of anastomosis.  The vein was of good quality.   The end-to-side anastomosis was performed with a running 7-0 Prolene suture.  A probe passed easily proximally and distally.  At the completion of the anastomosis, cardioplegia was administered down the graft and there was good flow and good  hemostasis.  Additional cardioplegia was also given down the aortic root.  The distal end of the free right mammary graft then was bevelled.  It was anastomosed end-to-side to  the right coronary.  The right coronary traversed the acute margin and the posterior branch then ran across the inferior wall and up to the apex.  It was  a 1.5 mm good quality target at the site of the anastomosis.  The right mammary was anastomosed end-to-side with a running 8-0 Prolene suture.  At the completion of the anastomosis, cardioplegia was administered down the aortic root.  There was good  backbleeding from the right mammary.  There was good hemostasis at the anastomosis.  The left internal mammary artery was brought through a window in the pericardium.  The distal end was bevelled.  The LAD was dissected out.  There was a diagonal branch, which was identified and then the myocardial groove adjacent to that was explored.   The LAD was  noted.  It was a 1.5 mm vessel.  The left mammary was anastomosed end-to-side to the LAD with a running 8-0 Prolene suture.  At the completion of the anastomosis, the bulldog clamp was removed.  Septal rewarming was noted.  The bulldog clamp  was replaced and the mammary pedicle was tacked to the epicardium with interrupted 6-0 Prolene sutures.  Additional cardioplegia was administered.  The cardioplegia cannula then was removed from the ascending aorta.  The proximal anastomosis for the free right mammary was performed with a 4.0 mm punch aortotomy with running 7-0 Prolene suture.  A 6-0  Prolene suture was used for the vein graft.  At the completion of the vein graft, the patient was placed in Trendelenburg position.  Lidocaine was administered.  The bulldog clamp was removed from the left mammary.  De-airing was performed and the aortic  crossclamp was removed.  The total crossclamp time was 72 minutes.  The patient required 2 defibrillations with 10 joules and then was in sinus rhythm thereafter.  While rewarming was completed, all proximal and distal anastomoses were inspected for hemostasis.  Epicardial pacing wires were placed on the right ventricle and right atrium.  Bilateral pleural tubes were placed.  The 28-French Blake drains were used  for the pleural tubes.  When the patient had rewarmed to  a core temperature of 37 degrees Celsius, he was weaned from cardiopulmonary bypass on the first attempt.  Total bypass time was 113 minutes.  The initial cardiac index was greater than 2 L per  minute per meter squared.  There were intermittently difficulties with the arterial line, but when the arterial line was working, his hemodynamics were good.  Consideration was given to placement of a femoral line, but by the time that could have been  performed, the radial artery line was functioning correctly.  Post-bypass TEE was not significantly changed from the pre-bypass study.  A test dose of protamine was  administered and was well tolerated.  The atrial and aortic cannulae were removed.  The  remainder of the protamine was administered without incident.  The chest was copiously irrigated with warm saline.  The pericardium was brought together over the aorta and base of the heart with interrupted 3-0 silk sutures.  A mediastinal chest tube was  placed through a separate subcostal incision.  The sternum was closed with a combination of single and double heavy gauge stainless steel wires.  Pectoralis fascia, subcutaneous tissue and skin were closed in standard fashion.  All sponge, needle and  instrument counts were correct at the end of the procedure.  The patient was taken from the operating room to the Surgical Intensive Care Unit in good condition.  VN/NUANCE  D:03/01/2020 T:03/01/2020 JOB:[id number]

## 2020-03-01 NOTE — Transfer of Care (Signed)
Immediate Anesthesia Transfer of Care Note  Patient: Sean Joseph  Procedure(s) Performed: CORONARY ARTERY BYPASS GRAFTING (CABG) X 3, USING BILATERAL MAMMORY ARTERIES, RIGHT LEG GREATER SAPHENOUS VEIN HARVESTED. (N/A Chest) TRANSESOPHAGEAL ECHOCARDIOGRAM (TEE) (N/A )  Patient Location: PACU and ICU  Anesthesia Type:General  Level of Consciousness: sedated and Patient remains intubated per anesthesia plan  Airway & Oxygen Therapy: Patient remains intubated per anesthesia plan and Patient placed on Ventilator (see vital sign flow sheet for setting)  Post-op Assessment: Report given to RN and Post -op Vital signs reviewed and stable  Post vital signs: Reviewed and stable  Last Vitals:  Vitals Value Taken Time  BP    Temp 37 C 03/01/20 1410  Pulse 78 03/01/20 1410  Resp 12 03/01/20 1410  SpO2 99 % 03/01/20 1410  Vitals shown include unvalidated device data.  Last Pain:  Vitals:   03/01/20 0600  TempSrc:   PainSc: Asleep         Complications: No apparent anesthesia complications

## 2020-03-01 NOTE — Anesthesia Procedure Notes (Signed)
Arterial Line Insertion Start/End4/16/2021 6:45 AM Performed by: Laruth Bouchard., CRNA, CRNA  Patient location: Pre-op. Preanesthetic checklist: patient identified, IV checked, site marked, risks and benefits discussed, surgical consent, monitors and equipment checked, pre-op evaluation, timeout performed and anesthesia consent Lidocaine 1% used for infiltration and patient sedated Right, radial was placed Catheter size: 20 G Hand hygiene performed  and maximum sterile barriers used   Attempts: 1 Procedure performed without using ultrasound guided technique. Following insertion, dressing applied and Biopatch. Post procedure assessment: normal  Patient tolerated the procedure well with no immediate complications.

## 2020-03-01 NOTE — Progress Notes (Signed)
Patient was transported from OR to 2H01 on the vent without any complications.

## 2020-03-02 ENCOUNTER — Inpatient Hospital Stay (HOSPITAL_COMMUNITY): Payer: BC Managed Care – PPO

## 2020-03-02 LAB — POCT I-STAT 7, (LYTES, BLD GAS, ICA,H+H)
Acid-base deficit: 4 mmol/L — ABNORMAL HIGH (ref 0.0–2.0)
Acid-base deficit: 3 mmol/L — ABNORMAL HIGH (ref 0.0–2.0)
Acid-base deficit: 3 mmol/L — ABNORMAL HIGH (ref 0.0–2.0)
Acid-base deficit: 1 mmol/L (ref 0.0–2.0)
Bicarbonate: 21.6 mmol/L (ref 20.0–28.0)
Bicarbonate: 22.5 mmol/L (ref 20.0–28.0)
Bicarbonate: 21.8 mmol/L (ref 20.0–28.0)
Bicarbonate: 24.4 mmol/L (ref 20.0–28.0)
Calcium, Ion: 1.21 mmol/L (ref 1.15–1.40)
Calcium, Ion: 1.21 mmol/L (ref 1.15–1.40)
Calcium, Ion: 1.2 mmol/L (ref 1.15–1.40)
Calcium, Ion: 1.22 mmol/L (ref 1.15–1.40)
HCT: 44 % (ref 39.0–52.0)
HCT: 44 % (ref 39.0–52.0)
HCT: 43 % (ref 39.0–52.0)
HCT: 42 % (ref 39.0–52.0)
Hemoglobin: 15 g/dL (ref 13.0–17.0)
Hemoglobin: 15 g/dL (ref 13.0–17.0)
Hemoglobin: 14.6 g/dL (ref 13.0–17.0)
Hemoglobin: 14.3 g/dL (ref 13.0–17.0)
O2 Saturation: 88 %
O2 Saturation: 89 %
O2 Saturation: 86 %
O2 Saturation: 86 %
Patient temperature: 36.8
Patient temperature: 98.6
Patient temperature: 98.6
Patient temperature: 37.2
Potassium: 4.5 mmol/L (ref 3.5–5.1)
Potassium: 4.5 mmol/L (ref 3.5–5.1)
Potassium: 4.3 mmol/L (ref 3.5–5.1)
Potassium: 4.2 mmol/L (ref 3.5–5.1)
Sodium: 137 mmol/L (ref 135–145)
Sodium: 138 mmol/L (ref 135–145)
Sodium: 137 mmol/L (ref 135–145)
Sodium: 136 mmol/L (ref 135–145)
TCO2: 23 mmol/L (ref 22–32)
TCO2: 24 mmol/L (ref 22–32)
TCO2: 23 mmol/L (ref 22–32)
TCO2: 26 mmol/L (ref 22–32)
pCO2 arterial: 41 mmHg (ref 32.0–48.0)
pCO2 arterial: 42 mmHg (ref 32.0–48.0)
pCO2 arterial: 39.2 mmHg (ref 32.0–48.0)
pCO2 arterial: 41 mmHg (ref 32.0–48.0)
pH, Arterial: 7.328 — ABNORMAL LOW (ref 7.350–7.450)
pH, Arterial: 7.336 — ABNORMAL LOW (ref 7.350–7.450)
pH, Arterial: 7.353 (ref 7.350–7.450)
pH, Arterial: 7.383 (ref 7.350–7.450)
pO2, Arterial: 58 mmHg — ABNORMAL LOW (ref 83.0–108.0)
pO2, Arterial: 61 mmHg — ABNORMAL LOW (ref 83.0–108.0)
pO2, Arterial: 53 mmHg — ABNORMAL LOW (ref 83.0–108.0)
pO2, Arterial: 53 mmHg — ABNORMAL LOW (ref 83.0–108.0)

## 2020-03-02 LAB — CBC
HCT: 42.4 % (ref 39.0–52.0)
HCT: 42.7 % (ref 39.0–52.0)
Hemoglobin: 13.8 g/dL (ref 13.0–17.0)
Hemoglobin: 14.3 g/dL (ref 13.0–17.0)
MCH: 31.7 pg (ref 26.0–34.0)
MCH: 32 pg (ref 26.0–34.0)
MCHC: 32.5 g/dL (ref 30.0–36.0)
MCHC: 33.5 g/dL (ref 30.0–36.0)
MCV: 97.5 fL (ref 80.0–100.0)
MCV: 95.5 fL (ref 80.0–100.0)
Platelets: 123 10*3/uL — ABNORMAL LOW (ref 150–400)
Platelets: 126 10*3/uL — ABNORMAL LOW (ref 150–400)
RBC: 4.35 MIL/uL (ref 4.22–5.81)
RBC: 4.47 MIL/uL (ref 4.22–5.81)
RDW: 14.2 % (ref 11.5–15.5)
RDW: 14 % (ref 11.5–15.5)
WBC: 19.9 10*3/uL — ABNORMAL HIGH (ref 4.0–10.5)
WBC: 25.3 10*3/uL — ABNORMAL HIGH (ref 4.0–10.5)
nRBC: 0 % (ref 0.0–0.2)
nRBC: 0 % (ref 0.0–0.2)

## 2020-03-02 LAB — BASIC METABOLIC PANEL
Anion gap: 11 (ref 5–15)
Anion gap: 11 (ref 5–15)
BUN: 11 mg/dL (ref 6–20)
BUN: 18 mg/dL (ref 6–20)
CO2: 21 mmol/L — ABNORMAL LOW (ref 22–32)
CO2: 24 mmol/L (ref 22–32)
Calcium: 7.7 mg/dL — ABNORMAL LOW (ref 8.9–10.3)
Calcium: 8.6 mg/dL — ABNORMAL LOW (ref 8.9–10.3)
Chloride: 104 mmol/L (ref 98–111)
Chloride: 102 mmol/L (ref 98–111)
Creatinine, Ser: 0.91 mg/dL (ref 0.61–1.24)
Creatinine, Ser: 1.13 mg/dL (ref 0.61–1.24)
GFR calc Af Amer: >60 mL/min (ref >60)
GFR calc Af Amer: >60 mL/min (ref >60)
GFR calc non Af Amer: >60 mL/min (ref >60)
GFR calc non Af Amer: >60 mL/min (ref >60)
Glucose, Bld: 247 mg/dL — ABNORMAL HIGH (ref 70–99)
Glucose, Bld: 155 mg/dL — ABNORMAL HIGH (ref 70–99)
Potassium: 4.1 mmol/L (ref 3.5–5.1)
Potassium: 4.4 mmol/L (ref 3.5–5.1)
Sodium: 136 mmol/L (ref 135–145)
Sodium: 137 mmol/L (ref 135–145)

## 2020-03-02 LAB — MAGNESIUM
Magnesium: 2.3 mg/dL (ref 1.7–2.4)
Magnesium: 2.5 mg/dL — ABNORMAL HIGH (ref 1.7–2.4)

## 2020-03-02 LAB — GLUCOSE, CAPILLARY
Glucose-Capillary: 142 mg/dL — ABNORMAL HIGH (ref 70–99)
Glucose-Capillary: 142 mg/dL — ABNORMAL HIGH (ref 70–99)
Glucose-Capillary: 144 mg/dL — ABNORMAL HIGH (ref 70–99)
Glucose-Capillary: 145 mg/dL — ABNORMAL HIGH (ref 70–99)
Glucose-Capillary: 146 mg/dL — ABNORMAL HIGH (ref 70–99)
Glucose-Capillary: 150 mg/dL — ABNORMAL HIGH (ref 70–99)
Glucose-Capillary: 153 mg/dL — ABNORMAL HIGH (ref 70–99)
Glucose-Capillary: 154 mg/dL — ABNORMAL HIGH (ref 70–99)
Glucose-Capillary: 159 mg/dL — ABNORMAL HIGH (ref 70–99)
Glucose-Capillary: 159 mg/dL — ABNORMAL HIGH (ref 70–99)
Glucose-Capillary: 160 mg/dL — ABNORMAL HIGH (ref 70–99)
Glucose-Capillary: 161 mg/dL — ABNORMAL HIGH (ref 70–99)
Glucose-Capillary: 278 mg/dL — ABNORMAL HIGH (ref 70–99)

## 2020-03-02 MED ORDER — METHYLPREDNISOLONE SODIUM SUCC 125 MG IJ SOLR
80.0000 mg | Freq: Two times a day (BID) | INTRAMUSCULAR | Status: DC
  Administered 2020-03-02 – 2020-03-06 (×8): 80 mg via INTRAVENOUS
  Filled 2020-03-02 (×8): qty 2

## 2020-03-02 MED ORDER — METHYLPREDNISOLONE SODIUM SUCC 125 MG IJ SOLR
125.0000 mg | Freq: Once | INTRAMUSCULAR | Status: AC
  Administered 2020-03-02: 06:00:00 125 mg via INTRAVENOUS
  Filled 2020-03-02: qty 2

## 2020-03-02 MED ORDER — HYDRALAZINE HCL 20 MG/ML IJ SOLN
10.0000 mg | INTRAMUSCULAR | Status: DC | PRN
  Administered 2020-03-02 – 2020-03-06 (×4): 10 mg via INTRAVENOUS
  Filled 2020-03-02 (×4): qty 1

## 2020-03-02 MED ORDER — FUROSEMIDE 10 MG/ML IJ SOLN
20.0000 mg | Freq: Once | INTRAMUSCULAR | Status: AC
  Administered 2020-03-02: 20 mg via INTRAVENOUS
  Filled 2020-03-02: qty 2

## 2020-03-02 MED ORDER — ORAL CARE MOUTH RINSE
15.0000 mL | Freq: Two times a day (BID) | OROMUCOSAL | Status: DC
  Administered 2020-03-02 – 2020-03-12 (×9): 15 mL via OROMUCOSAL

## 2020-03-02 MED ORDER — FUROSEMIDE 10 MG/ML IJ SOLN
40.0000 mg | Freq: Two times a day (BID) | INTRAMUSCULAR | Status: DC
  Administered 2020-03-02 – 2020-03-03 (×4): 40 mg via INTRAVENOUS
  Filled 2020-03-02 (×3): qty 4

## 2020-03-02 MED ORDER — INSULIN DETEMIR 100 UNIT/ML ~~LOC~~ SOLN
12.0000 [IU] | Freq: Two times a day (BID) | SUBCUTANEOUS | Status: DC
  Administered 2020-03-02 – 2020-03-06 (×10): 12 [IU] via SUBCUTANEOUS
  Filled 2020-03-02 (×13): qty 0.12

## 2020-03-02 MED ORDER — CHLORHEXIDINE GLUCONATE 0.12 % MT SOLN
15.0000 mL | Freq: Two times a day (BID) | OROMUCOSAL | Status: DC
  Administered 2020-03-02 – 2020-03-12 (×14): 15 mL via OROMUCOSAL
  Filled 2020-03-02 (×16): qty 15

## 2020-03-02 MED ORDER — FUROSEMIDE 10 MG/ML IJ SOLN
40.0000 mg | Freq: Once | INTRAMUSCULAR | Status: DC
  Filled 2020-03-02: qty 4

## 2020-03-02 MED ORDER — INSULIN ASPART 100 UNIT/ML ~~LOC~~ SOLN
0.0000 [IU] | SUBCUTANEOUS | Status: DC
  Administered 2020-03-02 – 2020-03-03 (×4): 3 [IU] via SUBCUTANEOUS
  Administered 2020-03-03: 4 [IU] via SUBCUTANEOUS
  Administered 2020-03-03 – 2020-03-04 (×5): 3 [IU] via SUBCUTANEOUS

## 2020-03-02 MED ORDER — MIDAZOLAM HCL 2 MG/2ML IJ SOLN: 1.0000 mg | INTRAMUSCULAR | Status: DC | PRN

## 2020-03-02 NOTE — Progress Notes (Signed)
Unable to draw back blood on Sean Joseph. Patient complains of pain when being flushed. Repositioning attempted but still unable to draw back. Sean Joseph removed and pressure held until bleeding had stopped. MD to be notified.

## 2020-03-02 NOTE — Progress Notes (Signed)
1 Day Post-Op Procedure(s) (LRB): CORONARY ARTERY BYPASS GRAFTING (CABG) X 3, USING BILATERAL MAMMORY ARTERIES, RIGHT LEG GREATER SAPHENOUS VEIN HARVESTED. (N/A) TRANSESOPHAGEAL ECHOCARDIOGRAM (TEE) (N/A) Subjective: Preoperative severe COPD with problems with oxygenation after CABG.  Extubated now on BiPAP 100% FiO2 with O2 saturation 89% and stable hemodynamics.  Patient with unlabored breathing.  Chest x-ray shows COPD changes Continue nebs, BiPAP, diuresis, steroids Objective: Vital signs in last 24 hours: Temp:  [97.7 F (36.5 C)-98.8 F (37.1 C)] 98.6 F (37 C) (04/17 0800) Pulse Rate:  [60-106] 95 (04/17 0800) Cardiac Rhythm: Normal sinus rhythm (04/17 0400) Resp:  [12-29] 24 (04/17 0800) BP: (93-161)/(64-122) 137/89 (04/17 0800) SpO2:  [84 %-99 %] 88 % (04/17 0800) Arterial Line BP: (84-133)/(58-114) 94/87 (04/17 0130) FiO2 (%):  [40 %-100 %] 100 % (04/17 0800)  Hemodynamic parameters for last 24 hours: PAP: (30-65)/(7-32) 39/28 CO:  [3.9 L/min-6.9 L/min] 6.9 L/min CI:  [1.9 L/min/m2-3.4 L/min/m2] 3.4 L/min/m2  Intake/Output from previous day: 04/16 0701 - 04/17 0700 In: 4498.8 [P.O.:160; I.V.:2723.2; Blood:474; IV Piggyback:1141.7] Out: 4055 [Urine:3175; Blood:590; Chest Tube:290] Intake/Output this shift: Total I/O In: 25.5 [I.V.:25.5] Out: 150 [Urine:120; Chest Tube:30]  Patient calm on BiPAP Scattered rhonchi Sinus rhythm Minimal air leak from chest tube Responsive neuro intact Extremities warm  Lab Results: Recent Labs    03/01/20 2040 03/02/20 0155 03/02/20 0519 03/02/20 0648  WBC 18.3*  --  19.9*  --   HGB 14.9   < > 13.8 14.6  HCT 44.9   < > 42.4 43.0  PLT 145*  --  123*  --    < > = values in this interval not displayed.   BMET:  Recent Labs    03/01/20 2040 03/02/20 0155 03/02/20 0519 03/02/20 0648  NA 135   < > 136 137  K 4.5   < > 4.1 4.3  CL 106  --  104  --   CO2 21*  --  21*  --   GLUCOSE 141*  --  247*  --   BUN 16  --  11  --    CREATININE 1.01  --  0.91  --   CALCIUM 7.9*  --  7.7*  --    < > = values in this interval not displayed.    PT/INR:  Recent Labs    03/01/20 0405  LABPROT 14.0  INR 1.1   ABG    Component Value Date/Time   PHART 7.353 03/02/2020 0648   HCO3 21.8 03/02/2020 0648   TCO2 23 03/02/2020 0648   ACIDBASEDEF 3.0 (H) 03/02/2020 0648   O2SAT 86.0 03/02/2020 0648   CBG (last 3)  Recent Labs    03/02/20 0518 03/02/20 0652 03/02/20 0754  GLUCAP 145* 146* 161*    Assessment/Plan: S/P Procedure(s) (LRB): CORONARY ARTERY BYPASS GRAFTING (CABG) X 3, USING BILATERAL MAMMORY ARTERIES, RIGHT LEG GREATER SAPHENOUS VEIN HARVESTED. (N/A) TRANSESOPHAGEAL ECHOCARDIOGRAM (TEE) (N/A) Borderline respiratory status following extubation Multivessel CABG yesterday with severe's preoperative COPD Continue to monitor respiratory status closely.   LOS: 3 days    Sean Joseph 03/02/2020

## 2020-03-02 NOTE — Progress Notes (Signed)
Paged and spoke with MD Donata Clay to inform of ABG results.   [name] RRT suggested trying High Flow Nasal Cannula which would allow patient to perform incentive spirometer.  MD Donata Clay advised okay to trial high flow and for RN to assess patient's tolerance based on pulse oximetry.    Results for Sean Joseph, Sean Joseph (MRN [id number]) as of 03/02/2020 15:16  Ref. Range 03/02/2020 15:09  Sample type Unknown ARTERIAL  pH, Arterial Latest Ref Range: 7.350 - 7.450  7.383  pCO2 arterial Latest Ref Range: 32.0 - 48.0 mmHg 41.0  pO2, Arterial Latest Ref Range: 83.0 - 108.0 mmHg 53.0 (L)  TCO2 Latest Ref Range: 22 - 32 mmol/L 26  Acid-base deficit Latest Ref Range: 0.0 - 2.0 mmol/L 1.0  Bicarbonate Latest Ref Range: 20.0 - 28.0 mmol/L 24.4  O2 Saturation Latest Units: % 86.0  Patient temperature Unknown 37.2 C

## 2020-03-02 NOTE — Progress Notes (Signed)
RT note- Called for patient's increase work of breathing due to pain mostly, difficult time getting patient to relax and not fight the Bipap. sp02 85%-90%. Pressures changed on vent to help with ventilation, spont. VT 700-935ml at this time, continue to monitor.

## 2020-03-02 NOTE — Progress Notes (Signed)
patient coughing up phlegm and the bipap was pushing his secretions back down and not allowing him time to remove BiPap and use yankeur.  Patient very anxious and bipap removed to allow for secretions to be suctioned.  Patient stated he needed the bipap to stay off so he could cough up his secretions.  Placed back on HFNC and RT made aware.

## 2020-03-02 NOTE — Progress Notes (Signed)
RT note-Changed o2 device to HFNC, heated at 30L/min and 100%, Dr. Maren Beach at bedside, continue to monitor.

## 2020-03-02 NOTE — Progress Notes (Signed)
CT surgery p.m. Rounds  Patient switched from BiPAP to high flow nasal cannula and maintain sats at 90% for few hours.  We will rest on BiPAP tonight.  Diuresing well.  Maintaining sinus rhythm and stable hemodynamics.  Hemoglobin 14 potassium 4.4 creatinine 1.1

## 2020-03-03 ENCOUNTER — Inpatient Hospital Stay (HOSPITAL_COMMUNITY): Payer: BC Managed Care – PPO

## 2020-03-03 ENCOUNTER — Inpatient Hospital Stay: Payer: Self-pay

## 2020-03-03 LAB — GLUCOSE, CAPILLARY
Glucose-Capillary: 114 mg/dL — ABNORMAL HIGH (ref 70–99)
Glucose-Capillary: 126 mg/dL — ABNORMAL HIGH (ref 70–99)
Glucose-Capillary: 136 mg/dL — ABNORMAL HIGH (ref 70–99)
Glucose-Capillary: 139 mg/dL — ABNORMAL HIGH (ref 70–99)
Glucose-Capillary: 143 mg/dL — ABNORMAL HIGH (ref 70–99)
Glucose-Capillary: 148 mg/dL — ABNORMAL HIGH (ref 70–99)
Glucose-Capillary: 161 mg/dL — ABNORMAL HIGH (ref 70–99)

## 2020-03-03 LAB — BASIC METABOLIC PANEL
Anion gap: 11 (ref 5–15)
BUN: 27 mg/dL — ABNORMAL HIGH (ref 6–20)
CO2: 25 mmol/L (ref 22–32)
Calcium: 8.7 mg/dL — ABNORMAL LOW (ref 8.9–10.3)
Chloride: 100 mmol/L (ref 98–111)
Creatinine, Ser: 1.06 mg/dL (ref 0.61–1.24)
GFR calc Af Amer: >60 mL/min (ref >60)
GFR calc non Af Amer: >60 mL/min (ref >60)
Glucose, Bld: 123 mg/dL — ABNORMAL HIGH (ref 70–99)
Potassium: 4.3 mmol/L (ref 3.5–5.1)
Sodium: 136 mmol/L (ref 135–145)

## 2020-03-03 LAB — COMPREHENSIVE METABOLIC PANEL
ALT: 38 U/L (ref 0–44)
AST: 39 U/L (ref 15–41)
Albumin: 3.3 g/dL — ABNORMAL LOW (ref 3.5–5.0)
Alkaline Phosphatase: 41 U/L (ref 38–126)
Anion gap: 8 (ref 5–15)
BUN: 26 mg/dL — ABNORMAL HIGH (ref 6–20)
CO2: 25 mmol/L (ref 22–32)
Calcium: 8.6 mg/dL — ABNORMAL LOW (ref 8.9–10.3)
Chloride: 103 mmol/L (ref 98–111)
Creatinine, Ser: 1.06 mg/dL (ref 0.61–1.24)
GFR calc Af Amer: >60 mL/min (ref >60)
GFR calc non Af Amer: >60 mL/min (ref >60)
Glucose, Bld: 149 mg/dL — ABNORMAL HIGH (ref 70–99)
Potassium: 4.4 mmol/L (ref 3.5–5.1)
Sodium: 136 mmol/L (ref 135–145)
Total Bilirubin: 0.7 mg/dL (ref 0.3–1.2)
Total Protein: 6.3 g/dL — ABNORMAL LOW (ref 6.5–8.1)

## 2020-03-03 LAB — CBC
HCT: 40.8 % (ref 39.0–52.0)
Hemoglobin: 13.5 g/dL (ref 13.0–17.0)
MCH: 31.8 pg (ref 26.0–34.0)
MCHC: 33.1 g/dL (ref 30.0–36.0)
MCV: 96.2 fL (ref 80.0–100.0)
Platelets: 121 10*3/uL — ABNORMAL LOW (ref 150–400)
RBC: 4.24 MIL/uL (ref 4.22–5.81)
RDW: 13.9 % (ref 11.5–15.5)
WBC: 19.6 10*3/uL — ABNORMAL HIGH (ref 4.0–10.5)
nRBC: 0 % (ref 0.0–0.2)

## 2020-03-03 MED ORDER — ENSURE ENLIVE PO LIQD
237.0000 mL | Freq: Three times a day (TID) | ORAL | Status: DC
  Administered 2020-03-04 – 2020-03-13 (×20): 237 mL via ORAL

## 2020-03-03 MED ORDER — METOLAZONE 5 MG PO TABS
5.0000 mg | ORAL_TABLET | Freq: Every day | ORAL | Status: DC
  Administered 2020-03-03 – 2020-03-04 (×2): 5 mg via ORAL
  Filled 2020-03-03 (×2): qty 1

## 2020-03-03 MED ORDER — SODIUM CHLORIDE 0.9 % IV SOLN
1.0000 g | INTRAVENOUS | Status: AC
  Administered 2020-03-03 – 2020-03-09 (×7): 1 g via INTRAVENOUS
  Filled 2020-03-03: qty 10
  Filled 2020-03-03 (×3): qty 1
  Filled 2020-03-03 (×2): qty 10
  Filled 2020-03-03: qty 1

## 2020-03-03 MED ORDER — HYDROCOD POLST-CPM POLST ER 10-8 MG/5ML PO SUER
5.0000 mL | Freq: Two times a day (BID) | ORAL | Status: DC
  Administered 2020-03-04 – 2020-03-13 (×18): 5 mL via ORAL
  Filled 2020-03-03 (×21): qty 5

## 2020-03-03 NOTE — Progress Notes (Signed)
CT surgery p.m. Rounds  Patient out of bed in chair on high flow nasal cannula O2 saturations 90 to 92% Started on full liquid diet Maintaining sinus rhythm Central line has clotted off-we will remove and place PICC for adequate IV access Mediastinal tube out, pleural tube small air leak with cough

## 2020-03-03 NOTE — Progress Notes (Signed)
Consult placed to remove CVC. Spoke with Yasmin, RN who states she will remove it tonight per order.  Gilman Schmidt, RN VAST

## 2020-03-03 NOTE — Plan of Care (Signed)
Pt is alert and oriented, alternates between heated HFNC and bipap. Pt has expiratory wheezing throughout, diminished lung bases, a dry non productive cough, and reports feeling the same this morning in regards to his oxygenation. Pt has been highly encouraged to wear bipap throughout the night, being that oxygen saturation is in the mid 90s on bipap. Pt reports he can not breathe, "that thing suffocates me and how am I supposed to cough up stuff if it pushes them back in?!" Pt has been reassured and reeducated. Pt gets anxious at times on bipap and starts attempting to remove it himself. At 4am, RN walked into pt's room for reassessment and morning labs; pt had removed his bipap, his hand was around swan and stated, "I can't breathe, it's suffocating me!" He was reassured and reeducated on swan placement. Ernestine Conrad is now at 28. Pt was placed on HFNC and instructed to take deep breaths. Precedix gtt has remained on throughout the night. Pt does not tolerate much activity, quickly desaturates. Call bell is within reach, bed is in lowest position, and bed alarm is on.   Problem: Clinical Measurements: Goal: Respiratory complications will improve Outcome: Not Progressing Goal: Cardiovascular complication will be avoided Outcome: Progressing   Problem: Activity: Goal: Risk for activity intolerance will decrease Outcome: Not Progressing

## 2020-03-03 NOTE — Progress Notes (Signed)
Called Sean Clay MD regarding pt's moderate crepitus noticed from pt's chest up into his neck and forehead. Pt reports he feels swollen. Pt was reassured and educated on crepitus. All chest tube sites were reassessed and redressed. Vaseline gauze was changed as well.  MD ordered chest tube suction to be increased to 40, tussionex 5 ML BID for cough, give versed if needed for anxiety, and to reassess chest tube placement via CXR in the morning. Pt's pulse ox is 90 to 94, and denies shortness of breath at this time. Call bell is within reach, bed alarm is on, and bed is in lowest position.

## 2020-03-04 ENCOUNTER — Encounter: Payer: Self-pay | Admitting: *Deleted

## 2020-03-04 ENCOUNTER — Inpatient Hospital Stay (HOSPITAL_COMMUNITY): Payer: BC Managed Care – PPO

## 2020-03-04 DIAGNOSIS — Z951 Presence of aortocoronary bypass graft: Secondary | ICD-10-CM

## 2020-03-04 LAB — POCT I-STAT, CHEM 8
BUN: 15 mg/dL (ref 6–20)
BUN: 16 mg/dL (ref 6–20)
BUN: 14 mg/dL (ref 6–20)
BUN: 15 mg/dL (ref 6–20)
BUN: 16 mg/dL (ref 6–20)
BUN: 15 mg/dL (ref 6–20)
Calcium, Ion: 1.13 mmol/L — ABNORMAL LOW (ref 1.15–1.40)
Calcium, Ion: 1.26 mmol/L (ref 1.15–1.40)
Calcium, Ion: 1.06 mmol/L — ABNORMAL LOW (ref 1.15–1.40)
Calcium, Ion: 1.07 mmol/L — ABNORMAL LOW (ref 1.15–1.40)
Calcium, Ion: 1.05 mmol/L — ABNORMAL LOW (ref 1.15–1.40)
Calcium, Ion: 1.35 mmol/L (ref 1.15–1.40)
Chloride: 105 mmol/L (ref 98–111)
Chloride: 104 mmol/L (ref 98–111)
Chloride: 103 mmol/L (ref 98–111)
Chloride: 107 mmol/L (ref 98–111)
Chloride: 105 mmol/L (ref 98–111)
Chloride: 107 mmol/L (ref 98–111)
Creatinine, Ser: 0.9 mg/dL (ref 0.61–1.24)
Creatinine, Ser: 1 mg/dL (ref 0.61–1.24)
Creatinine, Ser: 0.8 mg/dL (ref 0.61–1.24)
Creatinine, Ser: 0.9 mg/dL (ref 0.61–1.24)
Creatinine, Ser: 1 mg/dL (ref 0.61–1.24)
Creatinine, Ser: 1 mg/dL (ref 0.61–1.24)
Glucose, Bld: 108 mg/dL — ABNORMAL HIGH (ref 70–99)
Glucose, Bld: 122 mg/dL — ABNORMAL HIGH (ref 70–99)
Glucose, Bld: 113 mg/dL — ABNORMAL HIGH (ref 70–99)
Glucose, Bld: 122 mg/dL — ABNORMAL HIGH (ref 70–99)
Glucose, Bld: 138 mg/dL — ABNORMAL HIGH (ref 70–99)
Glucose, Bld: 129 mg/dL — ABNORMAL HIGH (ref 70–99)
HCT: 48 % (ref 39.0–52.0)
HCT: 48 % (ref 39.0–52.0)
HCT: 37 % — ABNORMAL LOW (ref 39.0–52.0)
HCT: 40 % (ref 39.0–52.0)
HCT: 35 % — ABNORMAL LOW (ref 39.0–52.0)
HCT: 33 % — ABNORMAL LOW (ref 39.0–52.0)
Hemoglobin: 16.3 g/dL (ref 13.0–17.0)
Hemoglobin: 16.3 g/dL (ref 13.0–17.0)
Hemoglobin: 12.6 g/dL — ABNORMAL LOW (ref 13.0–17.0)
Hemoglobin: 13.6 g/dL (ref 13.0–17.0)
Hemoglobin: 11.9 g/dL — ABNORMAL LOW (ref 13.0–17.0)
Hemoglobin: 11.2 g/dL — ABNORMAL LOW (ref 13.0–17.0)
Potassium: 4.6 mmol/L (ref 3.5–5.1)
Potassium: 5.6 mmol/L — ABNORMAL HIGH (ref 3.5–5.1)
Potassium: 6 mmol/L — ABNORMAL HIGH (ref 3.5–5.1)
Potassium: 6.9 mmol/L (ref 3.5–5.1)
Potassium: 7.2 mmol/L (ref 3.5–5.1)
Potassium: 5.2 mmol/L — ABNORMAL HIGH (ref 3.5–5.1)
Sodium: 141 mmol/L (ref 135–145)
Sodium: 140 mmol/L (ref 135–145)
Sodium: 138 mmol/L (ref 135–145)
Sodium: 139 mmol/L (ref 135–145)
Sodium: 136 mmol/L (ref 135–145)
Sodium: 140 mmol/L (ref 135–145)
TCO2: 31 mmol/L (ref 22–32)
TCO2: 28 mmol/L (ref 22–32)
TCO2: 26 mmol/L (ref 22–32)
TCO2: 29 mmol/L (ref 22–32)
TCO2: 27 mmol/L (ref 22–32)
TCO2: 24 mmol/L (ref 22–32)

## 2020-03-04 LAB — POCT I-STAT 7, (LYTES, BLD GAS, ICA,H+H)
Acid-Base Excess: 1 mmol/L (ref 0.0–2.0)
Acid-base deficit: 1 mmol/L (ref 0.0–2.0)
Acid-base deficit: 4 mmol/L — ABNORMAL HIGH (ref 0.0–2.0)
Acid-base deficit: 3 mmol/L — ABNORMAL HIGH (ref 0.0–2.0)
Bicarbonate: 27.1 mmol/L (ref 20.0–28.0)
Bicarbonate: 28 mmol/L (ref 20.0–28.0)
Bicarbonate: 25.2 mmol/L (ref 20.0–28.0)
Bicarbonate: 26.6 mmol/L (ref 20.0–28.0)
Bicarbonate: 22.5 mmol/L (ref 20.0–28.0)
Calcium, Ion: 1.29 mmol/L (ref 1.15–1.40)
Calcium, Ion: 1.28 mmol/L (ref 1.15–1.40)
Calcium, Ion: 1.04 mmol/L — ABNORMAL LOW (ref 1.15–1.40)
Calcium, Ion: 1.06 mmol/L — ABNORMAL LOW (ref 1.15–1.40)
Calcium, Ion: 1.36 mmol/L (ref 1.15–1.40)
HCT: 48 % (ref 39.0–52.0)
HCT: 50 % (ref 39.0–52.0)
HCT: 38 % — ABNORMAL LOW (ref 39.0–52.0)
HCT: 39 % (ref 39.0–52.0)
HCT: 35 % — ABNORMAL LOW (ref 39.0–52.0)
Hemoglobin: 16.3 g/dL (ref 13.0–17.0)
Hemoglobin: 17 g/dL (ref 13.0–17.0)
Hemoglobin: 12.9 g/dL — ABNORMAL LOW (ref 13.0–17.0)
Hemoglobin: 13.3 g/dL (ref 13.0–17.0)
Hemoglobin: 11.9 g/dL — ABNORMAL LOW (ref 13.0–17.0)
O2 Saturation: 100 %
O2 Saturation: 92 %
O2 Saturation: 100 %
O2 Saturation: 100 %
O2 Saturation: 91 %
Potassium: 4.7 mmol/L (ref 3.5–5.1)
Potassium: 5.6 mmol/L — ABNORMAL HIGH (ref 3.5–5.1)
Potassium: 6 mmol/L — ABNORMAL HIGH (ref 3.5–5.1)
Potassium: 6.8 mmol/L (ref 3.5–5.1)
Potassium: 5.2 mmol/L — ABNORMAL HIGH (ref 3.5–5.1)
Sodium: 140 mmol/L (ref 135–145)
Sodium: 140 mmol/L (ref 135–145)
Sodium: 136 mmol/L (ref 135–145)
Sodium: 139 mmol/L (ref 135–145)
Sodium: 141 mmol/L (ref 135–145)
TCO2: 29 mmol/L (ref 22–32)
TCO2: 31 mmol/L (ref 22–32)
TCO2: 26 mmol/L (ref 22–32)
TCO2: 28 mmol/L (ref 22–32)
TCO2: 24 mmol/L (ref 22–32)
pCO2 arterial: 57.1 mmHg — ABNORMAL HIGH (ref 32.0–48.0)
pCO2 arterial: 82.7 mmHg (ref 32.0–48.0)
pCO2 arterial: 39.6 mmHg (ref 32.0–48.0)
pCO2 arterial: 50.5 mmHg — ABNORMAL HIGH (ref 32.0–48.0)
pCO2 arterial: 42.4 mmHg (ref 32.0–48.0)
pH, Arterial: 7.284 — ABNORMAL LOW (ref 7.350–7.450)
pH, Arterial: 7.138 — CL (ref 7.350–7.450)
pH, Arterial: 7.33 — ABNORMAL LOW (ref 7.350–7.450)
pH, Arterial: 7.412 (ref 7.350–7.450)
pH, Arterial: 7.333 — ABNORMAL LOW (ref 7.350–7.450)
pO2, Arterial: 347 mmHg — ABNORMAL HIGH (ref 83.0–108.0)
pO2, Arterial: 85 mmHg (ref 83.0–108.0)
pO2, Arterial: 329 mmHg — ABNORMAL HIGH (ref 83.0–108.0)
pO2, Arterial: 391 mmHg — ABNORMAL HIGH (ref 83.0–108.0)
pO2, Arterial: 65 mmHg — ABNORMAL LOW (ref 83.0–108.0)

## 2020-03-04 LAB — COMPREHENSIVE METABOLIC PANEL WITH GFR
ALT: 36 U/L (ref 0–44)
AST: 33 U/L (ref 15–41)
Albumin: 3.3 g/dL — ABNORMAL LOW (ref 3.5–5.0)
Alkaline Phosphatase: 44 U/L (ref 38–126)
Anion gap: 10 (ref 5–15)
BUN: 28 mg/dL — ABNORMAL HIGH (ref 6–20)
CO2: 27 mmol/L (ref 22–32)
Calcium: 8.9 mg/dL (ref 8.9–10.3)
Chloride: 97 mmol/L — ABNORMAL LOW (ref 98–111)
Creatinine, Ser: 0.94 mg/dL (ref 0.61–1.24)
GFR calc Af Amer: >60 mL/min
GFR calc non Af Amer: >60 mL/min
Glucose, Bld: 128 mg/dL — ABNORMAL HIGH (ref 70–99)
Potassium: 4.1 mmol/L (ref 3.5–5.1)
Sodium: 134 mmol/L — ABNORMAL LOW (ref 135–145)
Total Bilirubin: 0.6 mg/dL (ref 0.3–1.2)
Total Protein: 6.5 g/dL (ref 6.5–8.1)

## 2020-03-04 LAB — CBC
HCT: 38.4 % — ABNORMAL LOW (ref 39.0–52.0)
Hemoglobin: 13.2 g/dL (ref 13.0–17.0)
MCH: 32.5 pg (ref 26.0–34.0)
MCHC: 34.4 g/dL (ref 30.0–36.0)
MCV: 94.6 fL (ref 80.0–100.0)
Platelets: 131 K/uL — ABNORMAL LOW (ref 150–400)
RBC: 4.06 MIL/uL — ABNORMAL LOW (ref 4.22–5.81)
RDW: 13.6 % (ref 11.5–15.5)
WBC: 18.5 K/uL — ABNORMAL HIGH (ref 4.0–10.5)
nRBC: 0 % (ref 0.0–0.2)

## 2020-03-04 LAB — BASIC METABOLIC PANEL
Anion gap: 13 (ref 5–15)
BUN: 26 mg/dL — ABNORMAL HIGH (ref 6–20)
CO2: 28 mmol/L (ref 22–32)
Calcium: 9.2 mg/dL (ref 8.9–10.3)
Chloride: 95 mmol/L — ABNORMAL LOW (ref 98–111)
Creatinine, Ser: 1.15 mg/dL (ref 0.61–1.24)
GFR calc Af Amer: >60 mL/min (ref >60)
GFR calc non Af Amer: >60 mL/min (ref >60)
Glucose, Bld: 132 mg/dL — ABNORMAL HIGH (ref 70–99)
Potassium: 4.2 mmol/L (ref 3.5–5.1)
Sodium: 136 mmol/L (ref 135–145)

## 2020-03-04 LAB — GLUCOSE, CAPILLARY
Glucose-Capillary: 132 mg/dL — ABNORMAL HIGH (ref 70–99)
Glucose-Capillary: 107 mg/dL — ABNORMAL HIGH (ref 70–99)
Glucose-Capillary: 110 mg/dL — ABNORMAL HIGH (ref 70–99)
Glucose-Capillary: 129 mg/dL — ABNORMAL HIGH (ref 70–99)
Glucose-Capillary: 142 mg/dL — ABNORMAL HIGH (ref 70–99)

## 2020-03-04 MED ORDER — ENOXAPARIN SODIUM 40 MG/0.4ML ~~LOC~~ SOLN
40.0000 mg | SUBCUTANEOUS | Status: DC
  Administered 2020-03-04 – 2020-03-13 (×10): 40 mg via SUBCUTANEOUS
  Filled 2020-03-04 (×10): qty 0.4

## 2020-03-04 MED ORDER — ALPRAZOLAM 0.25 MG PO TABS: 0.2500 mg | ORAL_TABLET | Freq: Four times a day (QID) | ORAL | Status: DC | PRN

## 2020-03-04 MED ORDER — INSULIN ASPART 100 UNIT/ML ~~LOC~~ SOLN
0.0000 [IU] | Freq: Three times a day (TID) | SUBCUTANEOUS | Status: DC
  Administered 2020-03-04: 0.2 [IU] via SUBCUTANEOUS
  Administered 2020-03-05: 3 [IU] via SUBCUTANEOUS
  Administered 2020-03-05 – 2020-03-06 (×2): 2 [IU] via SUBCUTANEOUS
  Administered 2020-03-06: 3 [IU] via SUBCUTANEOUS
  Administered 2020-03-06 – 2020-03-07 (×2): 2 [IU] via SUBCUTANEOUS
  Administered 2020-03-07 – 2020-03-09 (×3): 3 [IU] via SUBCUTANEOUS
  Administered 2020-03-09 – 2020-03-13 (×4): 2 [IU] via SUBCUTANEOUS

## 2020-03-04 MED ORDER — INSULIN ASPART 100 UNIT/ML ~~LOC~~ SOLN: 0.0000 [IU] | Freq: Every day | SUBCUTANEOUS | Status: DC

## 2020-03-04 NOTE — Plan of Care (Signed)
Pt alert and oriented, on 25L heated HFNC, less wheezing noted, developed subcutaneous emphysema last night from chest to head. Pt denies increased shortness of breath. Oxygen maintaining mid 90s throughout the night. Pt tolerated activity well this morning, minimal desaturation. Scheduled medication given for cough (see MAR). Moderate chest tube leak. No signs of bleeding. Pain more controlled, less anxiety noted. Pt voices no concerns or complaints at this time, was able to rest more than previous night. Call bell is within reach. Problem: Education: Goal: Knowledge of General Education information will improve Description: Including pain rating scale, medication(s)/side effects and non-pharmacologic comfort measures Outcome: Progressing   Problem: Clinical Measurements: Goal: Ability to maintain clinical measurements within normal limits will improve Outcome: Progressing Goal: Respiratory complications will improve Outcome: Progressing   Problem: Coping: Goal: Level of anxiety will decrease Outcome: Progressing   Problem: Elimination: Goal: Will not experience complications related to urinary retention Outcome: Progressing   Problem: Pain Managment: Goal: General experience of comfort will improve Outcome: Progressing

## 2020-03-04 NOTE — Progress Notes (Signed)
Erin RN to verify if the PICC is still needed for IV access.

## 2020-03-04 NOTE — Progress Notes (Signed)
Patient ID: Sean Joseph, male   DOB: 1966-08-24, 55 y.o.   MRN: [id number] EVENING ROUNDS NOTE :     [address]       [place] [address]             [phone]                 3 Days Post-Op Procedure(s) (LRB): CORONARY ARTERY BYPASS GRAFTING (CABG) X 3, USING BILATERAL MAMMORY ARTERIES, RIGHT LEG GREATER SAPHENOUS VEIN HARVESTED. (N/A) TRANSESOPHAGEAL ECHOCARDIOGRAM (TEE) (N/A)  Total Length of Stay:  LOS: 5 days  BP (!) 150/132   Pulse (!) 116   Temp 98.7 F (37.1 C) (Oral)   Resp (!) 28   Ht 5\' 7"  (1.702 m)   Wt 85.4 kg   SpO2 91%   BMI 29.49 kg/m   .Intake/Output      04/18 0701 - 04/19 0700 04/19 0701 - 04/20 0700   P.O. 240    I.V. (mL/kg) 325.4 (3.8) 2.8 (0)   IV Piggyback 100    Total Intake(mL/kg) 665.4 (7.8) 2.8 (0)   Urine (mL/kg/hr) 3600 (1.8) 525 (0.6)   Chest Tube 220    Total Output 3820 525   Net -3154.6 -522.2          . sodium chloride Stopped (03/03/20 1043)  . sodium chloride Stopped (03/02/20 0520)  . sodium chloride Stopped (03/04/20 0700)  . cefTRIAXone (ROCEPHIN)  IV 1 g (03/04/20 1153)  . lactated ringers    . lactated ringers Stopped (03/03/20 2005)  . nitroGLYCERIN Stopped (03/03/20 1654)     Lab Results  Component Value Date   WBC 18.5 (H) 03/04/2020   HGB 13.2 03/04/2020   HCT 38.4 (L) 03/04/2020   PLT 131 (L) 03/04/2020   GLUCOSE 128 (H) 03/04/2020   CHOL 222 (H) 02/29/2020   TRIG 231 (H) 02/29/2020   HDL 32 (L) 02/29/2020   LDLCALC 144 (H) 02/29/2020   ALT 36 03/04/2020   AST 33 03/04/2020   NA 134 (L) 03/04/2020   K 4.1 03/04/2020   CL 97 (L) 03/04/2020   CREATININE 0.94 03/04/2020   BUN 28 (H) 03/04/2020   CO2 27 03/04/2020   TSH 1.549 02/29/2020   INR 1.1 03/01/2020   HGBA1C 6.0 (H) 02/29/2020   History of bilateral PYX in his twenties Now with air leak from chest tubes    03/02/2020 MD  Beeper [phone] Office [phone] 03/04/2020 5:22 PM

## 2020-03-04 NOTE — Progress Notes (Signed)
3 Days Post-Op Procedure(s) (LRB): CORONARY ARTERY BYPASS GRAFTING (CABG) X 3, USING BILATERAL MAMMORY ARTERIES, RIGHT LEG GREATER SAPHENOUS VEIN HARVESTED. (N/A) TRANSESOPHAGEAL ECHOCARDIOGRAM (TEE) (N/A) Subjective: Breathing is a little better, pain well controlled  Objective: Vital signs in last 24 hours: Temp:  [98 F (36.7 C)-99.3 F (37.4 C)] 98 F (36.7 C) (04/19 0729) Pulse Rate:  [65-117] 77 (04/19 0700) Cardiac Rhythm: Normal sinus rhythm (04/19 0400) Resp:  [9-34] 15 (04/19 0700) BP: (111-146)/(62-112) 123/79 (04/19 0700) SpO2:  [87 %-95 %] 93 % (04/19 0700) FiO2 (%):  [80 %-100 %] 80 % (04/19 0400) Weight:  [85.4 kg] 85.4 kg (04/19 0500)  Hemodynamic parameters for last 24 hours: PAP: (25-41)/(10-28) 34/15 CVP:  [8 mmHg-53 mmHg] 13 mmHg CO:  [6.6 L/min] 6.6 L/min CI:  [3.3 L/min/m2] 3.3 L/min/m2  Intake/Output from previous day: 04/18 0701 - 04/19 0700 In: 665.4 [P.O.:240; I.V.:325.4; IV Piggyback:100] Out: 3820 [Urine:3600; Chest Tube:220] Intake/Output this shift: No intake/output data recorded.  General appearance: alert, cooperative and no distress Neurologic: intact Heart: regular rate and rhythm Lungs: 6tachypneic, faint wheezing bilaterally, + SQ air small air lrak  Lab Results: Recent Labs    03/03/20 0433 03/04/20 0341  WBC 19.6* 18.5*  HGB 13.5 13.2  HCT 40.8 38.4*  PLT 121* 131*   BMET:  Recent Labs    03/03/20 1652 03/04/20 0341  NA 136 134*  K 4.3 4.1  CL 100 97*  CO2 25 27  GLUCOSE 123* 128*  BUN 27* 28*  CREATININE 1.06 0.94  CALCIUM 8.7* 8.9    PT/INR: No results for input(s): LABPROT, INR in the last 72 hours. ABG    Component Value Date/Time   PHART 7.383 03/02/2020 1509   HCO3 24.4 03/02/2020 1509   TCO2 26 03/02/2020 1509   ACIDBASEDEF 1.0 03/02/2020 1509   O2SAT 86.0 03/02/2020 1509   CBG (last 3)  Recent Labs    03/03/20 2342 03/04/20 0346 03/04/20 0726  GLUCAP 143* 132* 107*    Assessment/Plan: S/P  Procedure(s) (LRB): CORONARY ARTERY BYPASS GRAFTING (CABG) X 3, USING BILATERAL MAMMORY ARTERIES, RIGHT LEG GREATER SAPHENOUS VEIN HARVESTED. (N/A) TRANSESOPHAGEAL ECHOCARDIOGRAM (TEE) (N/A) POD # 3  CV- stable RESP- primary concern, Severe underlying bullous empysema  Acute on chronic respiratory failure  Has an air leak and SQ emphysema  Continue nebs, pulmonary hygeine, steroids and antibiotics  Wean O2 as tolerated RENAL- creatinine OK, has diuresed extensively over past 48 hours  Stop lasix for now ENDO- CBG OK- change to [facility] and HS GI- advance diet Mobilize as tolerated     LOS: 5 days    Loreli Slot 03/04/2020

## 2020-03-05 ENCOUNTER — Inpatient Hospital Stay (HOSPITAL_COMMUNITY): Payer: BC Managed Care – PPO

## 2020-03-05 LAB — COMPREHENSIVE METABOLIC PANEL
ALT: 42 U/L (ref 0–44)
AST: 31 U/L (ref 15–41)
Albumin: 3.6 g/dL (ref 3.5–5.0)
Alkaline Phosphatase: 52 U/L (ref 38–126)
Anion gap: 14 (ref 5–15)
BUN: 27 mg/dL — ABNORMAL HIGH (ref 6–20)
CO2: 31 mmol/L (ref 22–32)
Calcium: 9.6 mg/dL (ref 8.9–10.3)
Chloride: 90 mmol/L — ABNORMAL LOW (ref 98–111)
Creatinine, Ser: 1.02 mg/dL (ref 0.61–1.24)
GFR calc Af Amer: >60 mL/min (ref >60)
GFR calc non Af Amer: >60 mL/min (ref >60)
Glucose, Bld: 188 mg/dL — ABNORMAL HIGH (ref 70–99)
Potassium: 4.5 mmol/L (ref 3.5–5.1)
Sodium: 135 mmol/L (ref 135–145)
Total Bilirubin: 0.8 mg/dL (ref 0.3–1.2)
Total Protein: 7.5 g/dL (ref 6.5–8.1)

## 2020-03-05 LAB — GLUCOSE, CAPILLARY
Glucose-Capillary: 136 mg/dL — ABNORMAL HIGH (ref 70–99)
Glucose-Capillary: 190 mg/dL — ABNORMAL HIGH (ref 70–99)
Glucose-Capillary: 126 mg/dL — ABNORMAL HIGH (ref 70–99)
Glucose-Capillary: 105 mg/dL — ABNORMAL HIGH (ref 70–99)
Glucose-Capillary: 145 mg/dL — ABNORMAL HIGH (ref 70–99)

## 2020-03-05 LAB — BASIC METABOLIC PANEL
Anion gap: 14 (ref 5–15)
BUN: 29 mg/dL — ABNORMAL HIGH (ref 6–20)
CO2: 30 mmol/L (ref 22–32)
Calcium: 9.5 mg/dL (ref 8.9–10.3)
Chloride: 92 mmol/L — ABNORMAL LOW (ref 98–111)
Creatinine, Ser: 1.05 mg/dL (ref 0.61–1.24)
GFR calc Af Amer: >60 mL/min (ref >60)
GFR calc non Af Amer: >60 mL/min (ref >60)
Glucose, Bld: 104 mg/dL — ABNORMAL HIGH (ref 70–99)
Potassium: 4.2 mmol/L (ref 3.5–5.1)
Sodium: 136 mmol/L (ref 135–145)

## 2020-03-05 LAB — CBC
HCT: 44.6 % (ref 39.0–52.0)
Hemoglobin: 15.3 g/dL (ref 13.0–17.0)
MCH: 32.1 pg (ref 26.0–34.0)
MCHC: 34.3 g/dL (ref 30.0–36.0)
MCV: 93.7 fL (ref 80.0–100.0)
Platelets: 228 10*3/uL (ref 150–400)
RBC: 4.76 MIL/uL (ref 4.22–5.81)
RDW: 13.3 % (ref 11.5–15.5)
WBC: 21.5 10*3/uL — ABNORMAL HIGH (ref 4.0–10.5)
nRBC: 0 % (ref 0.0–0.2)

## 2020-03-05 MED ORDER — LEVALBUTEROL HCL 1.25 MG/0.5ML IN NEBU: 1.2500 mg | INHALATION_SOLUTION | Freq: Four times a day (QID) | RESPIRATORY_TRACT | Status: DC | PRN

## 2020-03-05 MED ORDER — MONTELUKAST SODIUM 10 MG PO TABS
10.0000 mg | ORAL_TABLET | Freq: Every day | ORAL | Status: DC
  Administered 2020-03-05 – 2020-03-14 (×10): 10 mg via ORAL
  Filled 2020-03-05 (×10): qty 1

## 2020-03-05 MED ORDER — DILTIAZEM HCL ER COATED BEADS 120 MG PO CP24
120.0000 mg | ORAL_CAPSULE | Freq: Every day | ORAL | Status: DC
  Administered 2020-03-05 – 2020-03-14 (×10): 120 mg via ORAL
  Filled 2020-03-05 (×10): qty 1

## 2020-03-05 MED ORDER — GUAIFENESIN ER 600 MG PO TB12
1200.0000 mg | ORAL_TABLET | Freq: Two times a day (BID) | ORAL | Status: AC
  Administered 2020-03-05 – 2020-03-09 (×10): 1200 mg via ORAL
  Filled 2020-03-05 (×10): qty 2

## 2020-03-05 NOTE — Progress Notes (Addendum)
EVENING ROUNDS NOTE :     [address]       [place] [address]             [phone]                 4 Days Post-Op Procedure(s) (LRB): CORONARY ARTERY BYPASS GRAFTING (CABG) X 3, USING BILATERAL MAMMORY ARTERIES, RIGHT LEG GREATER SAPHENOUS VEIN HARVESTED. (N/A) TRANSESOPHAGEAL ECHOCARDIOGRAM (TEE) (N/A)  Total Length of Stay:  LOS: 6 days  BP (!) 117/95   Pulse 75   Temp 98.3 F (36.8 C) (Oral)   Resp 12   Ht 5\' 7"  (1.702 m)   Wt 88.9 kg   SpO2 95%   BMI 30.70 kg/m   .Intake/Output      04/20 0701 - 04/21 0700   P.O. 240   I.V. (mL/kg)    IV Piggyback 100   Total Intake(mL/kg) 340 (3.8)   Urine (mL/kg/hr) 875 (0.8)   Chest Tube 5   Total Output 880   Net -540         . sodium chloride Stopped (03/03/20 1043)  . sodium chloride Stopped (03/02/20 0520)  . sodium chloride Stopped (03/04/20 0700)  . cefTRIAXone (ROCEPHIN)  IV 1 g (03/05/20 1040)  . lactated ringers    . lactated ringers Stopped (03/03/20 2005)  . nitroGLYCERIN Stopped (03/03/20 1654)     Lab Results  Component Value Date   WBC 21.5 (H) 03/05/2020   HGB 15.3 03/05/2020   HCT 44.6 03/05/2020   PLT 228 03/05/2020   GLUCOSE 104 (H) 03/05/2020   CHOL 222 (H) 02/29/2020   TRIG 231 (H) 02/29/2020   HDL 32 (L) 02/29/2020   LDLCALC 144 (H) 02/29/2020   ALT 42 03/05/2020   AST 31 03/05/2020   NA 136 03/05/2020   K 4.2 03/05/2020   CL 92 (L) 03/05/2020   CREATININE 1.05 03/05/2020   BUN 29 (H) 03/05/2020   CO2 30 03/05/2020   TSH 1.549 02/29/2020   INR 1.1 03/01/2020   HGBA1C 6.0 (H) 02/29/2020     1 sats good on HFNC, trying to wean  2 some HTN at times, cont to monitor, may need additional agent 3 overall progressing well  03/02/2020, PA-C   I have seen and examined the patient and agree with the assessment and plan as outlined.  Rowe Clack, MD 03/05/2020 10:29 PM

## 2020-03-05 NOTE — Discharge Summary (Signed)
Physician Discharge Summary  Patient ID: Sean Joseph MRN: [id number] DOB/AGE: 1965/12/12 54 y.o.  Admit date: 02/28/2020 Discharge date: 03/14/2020  Admission Diagnoses: Non-STEMI  Discharge Diagnoses:  Principal Problem:   Chest pain Active Problems:   Non-ST elevation (NSTEMI) myocardial infarction [facility])   Tobacco abuse   Hx of CABG  Patient Active Problem List   Diagnosis Date Noted  . Hx of CABG 03/01/2020  . Non-ST elevation (NSTEMI) myocardial infarction ([place])   . Tobacco abuse   . Chest pain 02/28/2020     HPI: At time of consultation   Sean Joseph is a 54 yo man with a past history of reflux and hypertriglyceridemia. Also has a history of tobacco abuse and a strong family history of premature CAD. Father had CABG at 37 yo. Was in his usual state of health until yesterday when he developed onset of a severe burning pain in his chest. Then radiated to arm and associated with severe pressure. He was taken to ED at [facility]. Pain improved after IV morphine and sublingual nitroglycerin. Initial enzymes negative but ruled in for nonSTEMI with troponin of 7,024. Transferred to [facility] and today underwent cardiac cath which revealed 3 vessel CAD with a 95% RCA stenosis, total occlusion of the LAD and a 60-70% stenosis of OM1. EF 40%.  The patient was seen in cardiothoracic surgical consultation by Dr. Erasmo Leventhal.  After evaluation of the patient and all his studies he recommended proceeding with CABG as his best revascularization option due to the severity of the anatomical findings and degree of disease.   Discharged Condition: good  Hospital Course: Patient remained medically stable and on 03/01/2020 was taken to the operating room where he underwent the below described procedure.  He tolerated well and was taken to the surgical intensive care unit in stable condition.  Postoperative hospital course:  The patient is overall progressed well.  He is maintained stable  hemodynamics.  He has maintained normal sinus rhythm.  He was weaned from the ventilator using standard protocols but has required aggressive pulmonary management due to severe COPD and emphysematous blebs.  He has had bilateral air leaks and chest tubes have been maintained in place until full resolution.  The right chest tube was removed on 03/05/2020.  He has had some hypertension and mild tachycardia.  He was treated with a course of ceftriaxone as well as steroids and nebs.  He did have a leukocytosis associated with steroid use but no definitive infection.  Due to his pulmonary issues he was not felt to be a good candidate for beta-blocker but was started on diltiazem.  He did have some postoperative volume overload but has had an excellent diuresis and creatinine and electrolytes are stable.  Blood sugars have been somewhat difficult to control and this is also related to steroids which are weaned over time.  The patient has no anemia.  He is tolerating a gradually increasing activities using standard protocols.  Incisions are noted to be healing well without evidence of infection. He was felt surgically stable for transfer from the ICU to [facility] for further convalescence on 04/21. He had a persistent, stable air leak from the left chest tube (likely related to bi pap). Dr. Roxan Hockey discussed with the patient that it is unlikely to resolve on its own (patient with severe emphysema). He discussed the options: bronchoscopy, intrabronchial valve placement, or VATS for bleb stapling. Ultimately, patient underwent a bronchoscopy and IBVs on 03/08/2020.  He did well in this regard  but did continue to have a air leak that over time has improved however not to the level that we feel as though we can remove the chest tube.  He has been transitioned to a mini express and otherwise is quite clinically stable.  He will be discharged home with the mini express and has been instructed on management.  His chest x-ray has  remained stable in appearance without evidence of pneumothorax.  Consults: cardiology  Significant Diagnostic Studies: routine post-op labs and serial CXR's. Cardiac cath and echocardiogram.    ECHOCARDIOGRAM REPORT       Patient Name:  Sean Joseph Date of Exam: 02/29/2020  Medical Rec #: [id number]    Height:    67.0 in  Accession #:  [id number]   Weight:    201.5 lb  Date of Birth: [birth year]    BSA:     2.029 m  Patient Age:  54 years    BP:      125/85 mmHg  Patient Gender: M        HR:      68 bpm.  Exam Location: Inpatient   Procedure: 2D Echo   Indications:  acute coronary syndrome    History:    Patient has no prior history of Echocardiogram  examinations.         COPD, Signs/Symptoms:Chest Pain; Risk Factors:Current  Smoker.    Sonographer:  Delcie Roch  Referring Phys: [phone] ADAM S BARNETT   IMPRESSIONS    1. Left ventricular ejection fraction, by estimation, is 55 to 60%. The  left ventricle has normal function. The left ventricle has no regional  wall motion abnormalities. There is mild concentric left ventricular  hypertrophy. Left ventricular diastolic  parameters are consistent with Grade I diastolic dysfunction (impaired  relaxation).  2. Right ventricular systolic function is normal. The right ventricular  size is normal.  3. The mitral valve is normal in structure. No evidence of mitral valve  regurgitation. No evidence of mitral stenosis.  4. The aortic valve is normal in structure. Aortic valve regurgitation is  not visualized. No aortic stenosis is present.  5. The inferior vena cava is normal in size with greater than 50%  respiratory variability, suggesting right atrial pressure of 3 mmHg.   FINDINGS  Left Ventricle: Left ventricular ejection fraction, by estimation, is 55  to 60%. The left ventricle has normal function. The left ventricle has no  regional  wall motion abnormalities. The left ventricular internal cavity  size was normal in size. There is  mild concentric left ventricular hypertrophy. Left ventricular diastolic  parameters are consistent with Grade I diastolic dysfunction (impaired  relaxation). Normal left ventricular filling pressure.   Right Ventricle: The right ventricular size is normal. No increase in  right ventricular wall thickness. Right ventricular systolic function is  normal.   Left Atrium: Left atrial size was normal in size.   Right Atrium: Right atrial size was normal in size.   Pericardium: There is no evidence of pericardial effusion.   Mitral Valve: The mitral valve is normal in structure. Normal mobility of  the mitral valve leaflets. No evidence of mitral valve regurgitation. No  evidence of mitral valve stenosis.   Tricuspid Valve: The tricuspid valve is normal in structure. Tricuspid  valve regurgitation is not demonstrated. No evidence of tricuspid  stenosis.   Aortic Valve: The aortic valve is normal in structure. Aortic valve  regurgitation is not visualized. No aortic stenosis is present.   Pulmonic  Valve: The pulmonic valve was normal in structure. Pulmonic valve  regurgitation is not visualized. No evidence of pulmonic stenosis.   Aorta: The aortic root is normal in size and structure.   Venous: The inferior vena cava is normal in size with greater than 50%  respiratory variability, suggesting right atrial pressure of 3 mmHg.   IAS/Shunts: No atrial level shunt detected by color flow Doppler.     LEFT VENTRICLE  PLAX 2D  LVIDd:     4.50 cm   Diastology  LVIDs:     2.90 cm   LV e' lateral:  6.96 cm/s  LV PW:     1.10 cm   LV E/e' lateral: 6.7  LV IVS:    1.10 cm   LV e' medial:  6.74 cm/s               LV E/e' medial: 6.9    LV Volumes (MOD)  LV vol d, MOD A4C: 88.2 ml  LV vol s, MOD A4C: 43.9 ml  LV SV MOD A4C:   88.2 ml   RIGHT  VENTRICLE  RV S prime:   9.68 cm/s  TAPSE (M-mode): 2.1 cm   LEFT ATRIUM       Index  LA diam:    3.60 cm 1.77 cm/m  LA Vol (A2C):  34.4 ml 16.96 ml/m  LA Vol (A4C):  43.9 ml 21.64 ml/m  LA Biplane Vol: 41.8 ml 20.60 ml/m  AORTIC VALVE  LVOT Vmax:  122.00 cm/s  LVOT Vmean: 71.300 cm/s  LVOT VTI:  0.233 m    AORTA  Ao Root diam: 3.00 cm   MITRAL VALVE  MV Area (PHT): 2.83 cm  SHUNTS  MV Decel Time: 268 msec  Systemic VTI: 0.23 m  MV E velocity: 46.60 cm/s  MV A velocity: 81.00 cm/s  MV E/A ratio: 0.58   Tobias Alexander MD  Electronically signed by Tobias Alexander MD  Signature Date/Time: 02/29/2020/8:19:43 PM     LEFT HEART CATH AND CORONARY ANGIOGRAPHY  Conclusion   Apical severe hypokinesis.  EF 40%.  LVEDP 10 mmHg.  Normal left main  Anatomically small LAD that stopped short of the left ventricular apex.  The mid segment is totally occluded after the origin of a large septal perforator.  Relatively long segment of total occlusion.  A small to moderate sized LAD fills by left to left collaterals.  Codominant circumflex with first obtuse marginal containing 60 and 70% stenosis proximal and distal.  RCA is codominant.  Proximal segmental 80% stenosis mid 60% stenosis and towards the distal portion of the mid segment there is a 95% stenosis.  The PDA runs the entire length of the posterior interventricular groove and wraps around the left ventricular apex.     Treatments: surgery:  OPERATIVE REPORT  DATE OF PROCEDURE:  03/01/2020  PREOPERATIVE DIAGNOSIS:  Three-vessel coronary artery disease, status post non-ST elevation myocardial infarction.  POSTOPERATIVE DIAGNOSIS:  Three-vessel coronary artery disease, status post non-ST elevation myocardial  infarction.  PROCEDURES:   1.  Median sternotomy.  2.  Extracorporeal circulation. 3.  Coronary artery bypass grafting x3 (left internal mammary artery to left anterior descending,  saphenous vein graft to obtuse marginal 1, free right internal mammary artery to distal right coronary_.  SURGEON:  Charlett Lango, MD  ASSISTANT:  Gershon Crane, PA-C  Discharge Exam: Blood pressure 126/80, pulse (!) 102, temperature 97.8 F (36.6 C), temperature source Oral, resp. rate 18, height 5\' 7"  (1.702 m), weight 85.7  kg, SpO2 96 %.  General appearance: alert, cooperative and no distress Heart: regular rate and rhythm Lungs: clear to auscultation bilaterally Abdomen: benign Extremities: no edema Wound: incis healing well   Disposition: Discharge disposition: 01-Home or Self Care     Stable and discharged home.  Discharge Instructions    Amb Referral to Cardiac Rehabilitation   Complete by: As directed    Referred to cardiac rehab program at [facility].   Diagnosis:  CABG NSTEMI     CABG X ___: 3   After initial evaluation and assessments completed: Virtual Based Care may be provided alone or in conjunction with Phase 2 Cardiac Rehab based on patient barriers.: Yes   Discharge patient   Complete by: As directed    Discharge disposition: 01-Home or Self Care   Discharge patient date: 03/14/2020     Allergies as of 03/14/2020      Reactions   Codeine Nausea And Vomiting      Medication List    TAKE these medications   albuterol 108 (90 Base) MCG/ACT inhaler Commonly known as: VENTOLIN HFA Inhale 1-2 puffs into the lungs every 4 (four) hours as needed for wheezing or shortness of breath.   aspirin 325 MG EC tablet Take 1 tablet (325 mg total) by mouth daily.   atorvastatin 80 MG tablet Commonly known as: LIPITOR Take 1 tablet (80 mg total) by mouth daily at 6 PM.   diltiazem 120 MG 24 hr capsule Commonly known as: CARDIZEM CD Take 1 capsule (120 mg total) by mouth daily.   montelukast 10 MG tablet Commonly known as: SINGULAIR Take 1 tablet (10 mg total) by mouth at bedtime. What changed: when to take this   pantoprazole 40 MG  tablet Commonly known as: PROTONIX Take 1 tablet (40 mg total) by mouth daily.   predniSONE 20 MG tablet Commonly known as: DELTASONE Take 1 tablet (20 mg total) by mouth daily with breakfast. For 2 days then 1/2 tablet for 2 days, then stop Start taking on: March 15, 2020   traMADol 50 MG tablet Commonly known as: Ultram Take 1 tablet (50 mg total) by mouth every 6 (six) hours as needed for up to 7 days.      Follow-up Information    Loreli Slot, MD. Go on 04/02/2020.   Specialty: Cardiothoracic Surgery Why: Please see discharge paperwork for follow-up appointment with surgeon.  Also obtain a chest x-ray at [facility] 1/2-hour prior to appointment.  Is located in the same office complex. Contact information: [address] [place] [address] [phone]        Sande Rives, MD. Go to.   Specialties: Internal Medicine, Cardiology, Radiology Why: Appointment time is at 11:00 am Contact information: [address] [place] [address] [phone]          The patient has been discharged on:   1.Beta Blocker:  Yes [   ]                              No   [ n  ]                              If No, reason:pulmonary issues  2.Ace Inhibitor/ARB: Yes [   ]  No  [ n   ]                                     If No, reason:BP too low to initiate with other current meds  3.Statin:   Yes [  y ]                  No  [   ]                  If No, reason:  4.Ecasa:  Yes  [ y  ]                  No   [   ]                  If No, reason: Signed: Rowe Clack PA-C 03/14/2020, 1:40 PM

## 2020-03-05 NOTE — Progress Notes (Signed)
4 Days Post-Op Procedure(s) (LRB): CORONARY ARTERY BYPASS GRAFTING (CABG) X 3, USING BILATERAL MAMMORY ARTERIES, RIGHT LEG GREATER SAPHENOUS VEIN HARVESTED. (N/A) TRANSESOPHAGEAL ECHOCARDIOGRAM (TEE) (N/A) Subjective: Feels a little better today. Still on high flow [place]  Objective: Vital signs in last 24 hours: Temp:  [98.3 F (36.8 C)-98.8 F (37.1 C)] 98.3 F (36.8 C) (04/20 0323) Pulse Rate:  [68-116] 77 (04/20 0515) Cardiac Rhythm: Normal sinus rhythm (04/20 0400) Resp:  [10-28] 14 (04/20 0515) BP: (125-153)/(87-132) 138/96 (04/20 0500) SpO2:  [89 %-96 %] 92 % (04/20 0515) FiO2 (%):  [80 %] 80 % (04/20 0250) Weight:  [88.9 kg] 88.9 kg (04/20 0500)  Hemodynamic parameters for last 24 hours:    Intake/Output from previous day: 04/19 0701 - 04/20 0700 In: 2.8 [I.V.:2.8] Out: 4460 [Urine:4450; Chest Tube:10] Intake/Output this shift: No intake/output data recorded.  General appearance: alert, cooperative and no distress Neurologic: intact Heart: regular rate and rhythm and + rub Lungs: diminished breath sounds bilaterally Extremities: no edema Wound: clean and dry air leak from left pleural tube. no air leak on right  Lab Results: Recent Labs    03/03/20 0433 03/04/20 0341  WBC 19.6* 18.5*  HGB 13.5 13.2  HCT 40.8 38.4*  PLT 121* 131*   BMET:  Recent Labs    03/04/20 0341 03/04/20 1927  NA 134* 136  K 4.1 4.2  CL 97* 95*  CO2 27 28  GLUCOSE 128* 132*  BUN 28* 26*  CREATININE 0.94 1.15  CALCIUM 8.9 9.2    PT/INR: No results for input(s): LABPROT, INR in the last 72 hours. ABG    Component Value Date/Time   PHART 7.383 03/02/2020 1509   HCO3 24.4 03/02/2020 1509   TCO2 26 03/02/2020 1509   ACIDBASEDEF 1.0 03/02/2020 1509   O2SAT 86.0 03/02/2020 1509   CBG (last 3)  Recent Labs    03/04/20 1533 03/04/20 2204 03/05/20 0628  GLUCAP 129* 142* 136*    Assessment/Plan: S/P Procedure(s) (LRB): CORONARY ARTERY BYPASS GRAFTING (CABG) X 3, USING  BILATERAL MAMMORY ARTERIES, RIGHT LEG GREATER SAPHENOUS VEIN HARVESTED. (N/A) TRANSESOPHAGEAL ECHOCARDIOGRAM (TEE) (N/A) -CV- mild tachycardia and hypertension  Not on beta blocker due to pulmonary issues. Will start low dose Diltiazem  RESP_ still on high flow [place], CXR shows some improvement in atelectasis. Has had a productive cough. Wean O2 as tolerated. Continue antibiotics, nebs, steroids.  Still has SQ emphysema and an air leak on the left- dc right pleural tube  RENAL- creatinine and lytes OK, 10 L negative overall and below admission weight  ENDO- CBG mildly elevated  GI- tolerating PO  SCD + enoxaparin for DVT prophylaxis  Ambulate   LOS: 6 days    Loreli Slot 03/05/2020

## 2020-03-06 ENCOUNTER — Inpatient Hospital Stay (HOSPITAL_COMMUNITY): Payer: BC Managed Care – PPO

## 2020-03-06 LAB — CBC
HCT: 44 % (ref 39.0–52.0)
Hemoglobin: 15.3 g/dL (ref 13.0–17.0)
MCH: 31.7 pg (ref 26.0–34.0)
MCHC: 34.8 g/dL (ref 30.0–36.0)
MCV: 91.3 fL (ref 80.0–100.0)
Platelets: 247 10*3/uL (ref 150–400)
RBC: 4.82 MIL/uL (ref 4.22–5.81)
RDW: 13.4 % (ref 11.5–15.5)
WBC: 23.5 10*3/uL — ABNORMAL HIGH (ref 4.0–10.5)
nRBC: 0 % (ref 0.0–0.2)

## 2020-03-06 LAB — COMPREHENSIVE METABOLIC PANEL
ALT: 43 U/L (ref 0–44)
AST: 31 U/L (ref 15–41)
Albumin: 3.6 g/dL (ref 3.5–5.0)
Alkaline Phosphatase: 53 U/L (ref 38–126)
Anion gap: 14 (ref 5–15)
BUN: 26 mg/dL — ABNORMAL HIGH (ref 6–20)
CO2: 26 mmol/L (ref 22–32)
Calcium: 9.5 mg/dL (ref 8.9–10.3)
Chloride: 95 mmol/L — ABNORMAL LOW (ref 98–111)
Creatinine, Ser: 0.94 mg/dL (ref 0.61–1.24)
GFR calc Af Amer: >60 mL/min (ref >60)
GFR calc non Af Amer: >60 mL/min (ref >60)
Glucose, Bld: 153 mg/dL — ABNORMAL HIGH (ref 70–99)
Potassium: 4.2 mmol/L (ref 3.5–5.1)
Sodium: 135 mmol/L (ref 135–145)
Total Bilirubin: 0.5 mg/dL (ref 0.3–1.2)
Total Protein: 7.3 g/dL (ref 6.5–8.1)

## 2020-03-06 LAB — BASIC METABOLIC PANEL
Anion gap: 14 (ref 5–15)
BUN: 34 mg/dL — ABNORMAL HIGH (ref 6–20)
CO2: 28 mmol/L (ref 22–32)
Calcium: 9.1 mg/dL (ref 8.9–10.3)
Chloride: 91 mmol/L — ABNORMAL LOW (ref 98–111)
Creatinine, Ser: 1.09 mg/dL (ref 0.61–1.24)
GFR calc Af Amer: >60 mL/min (ref >60)
GFR calc non Af Amer: >60 mL/min (ref >60)
Glucose, Bld: 124 mg/dL — ABNORMAL HIGH (ref 70–99)
Potassium: 4 mmol/L (ref 3.5–5.1)
Sodium: 133 mmol/L — ABNORMAL LOW (ref 135–145)

## 2020-03-06 LAB — GLUCOSE, CAPILLARY
Glucose-Capillary: 116 mg/dL — ABNORMAL HIGH (ref 70–99)
Glucose-Capillary: 121 mg/dL — ABNORMAL HIGH (ref 70–99)
Glucose-Capillary: 149 mg/dL — ABNORMAL HIGH (ref 70–99)
Glucose-Capillary: 150 mg/dL — ABNORMAL HIGH (ref 70–99)
Glucose-Capillary: 173 mg/dL — ABNORMAL HIGH (ref 70–99)
Glucose-Capillary: 185 mg/dL — ABNORMAL HIGH (ref 70–99)
Glucose-Capillary: 229 mg/dL — ABNORMAL HIGH (ref 70–99)

## 2020-03-06 MED ORDER — METHYLPREDNISOLONE SODIUM SUCC 40 MG IJ SOLR
40.0000 mg | Freq: Two times a day (BID) | INTRAMUSCULAR | Status: DC
  Administered 2020-03-06 – 2020-03-07 (×2): 40 mg via INTRAVENOUS
  Filled 2020-03-06 (×2): qty 1

## 2020-03-06 MED ORDER — SODIUM CHLORIDE 0.9% FLUSH
3.0000 mL | Freq: Two times a day (BID) | INTRAVENOUS | Status: DC
  Administered 2020-03-06 – 2020-03-13 (×9): 3 mL via INTRAVENOUS

## 2020-03-06 MED ORDER — ALUM & MAG HYDROXIDE-SIMETH 200-200-20 MG/5ML PO SUSP: 15.0000 mL | Freq: Four times a day (QID) | ORAL | Status: DC | PRN

## 2020-03-06 MED ORDER — SODIUM CHLORIDE 0.9% FLUSH: 3.0000 mL | INTRAVENOUS | Status: DC | PRN

## 2020-03-06 MED ORDER — ~~LOC~~ CARDIAC SURGERY, PATIENT & FAMILY EDUCATION
Freq: Once | Status: DC
  Filled 2020-03-06: qty 1

## 2020-03-06 MED ORDER — SODIUM CHLORIDE 0.9 % IV SOLN: 250.0000 mL | INTRAVENOUS | Status: DC | PRN

## 2020-03-06 MED ORDER — MAGNESIUM HYDROXIDE 400 MG/5ML PO SUSP: 30.0000 mL | Freq: Every day | ORAL | Status: DC | PRN

## 2020-03-06 MED FILL — Potassium Chloride Inj 2 mEq/ML: INTRAVENOUS | Qty: 40 | Status: AC

## 2020-03-06 MED FILL — Magnesium Sulfate Inj 50%: INTRAMUSCULAR | Qty: 10 | Status: AC

## 2020-03-06 MED FILL — Heparin Sodium (Porcine) Inj 1000 Unit/ML: INTRAMUSCULAR | Qty: 30 | Status: AC

## 2020-03-06 NOTE — Progress Notes (Addendum)
[address]       [place] [address]             [phone]      5 Days Post-Op Procedure(s) (LRB): CORONARY ARTERY BYPASS GRAFTING (CABG) X 3, USING BILATERAL MAMMORY ARTERIES, RIGHT LEG GREATER SAPHENOUS VEIN HARVESTED. (N/A) TRANSESOPHAGEAL ECHOCARDIOGRAM (TEE) (N/A) Subjective: Feels ok, breathing conts to be more comfortable over time, weaning HFNC  Objective: Vital signs in last 24 hours: Temp:  [97.8 F (36.6 C)-98.8 F (37.1 C)] 98.2 F (36.8 C) (04/21 0400) Pulse Rate:  [69-109] 98 (04/21 0810) Cardiac Rhythm: Normal sinus rhythm (04/21 0400) Resp:  [11-24] 16 (04/21 0810) BP: (111-161)/(72-106) 133/88 (04/21 0810) SpO2:  [91 %-96 %] 93 % (04/21 0810) Weight:  [87.3 kg] 87.3 kg (04/21 0500)  Hemodynamic parameters for last 24 hours:    Intake/Output from previous day: 04/20 0701 - 04/21 0700 In: 480 [P.O.:280; IV Piggyback:200] Out: 2090 [Urine:2075; Chest Tube:15] Intake/Output this shift: No intake/output data recorded.  General appearance: alert, cooperative and no distress Heart: regular rate and rhythm Lungs: fair air movement throughout Abdomen: benign Extremities: no edema Wound: incis healing well  Lab Results: Recent Labs    03/05/20 0735 03/06/20 0255  WBC 21.5* 23.5*  HGB 15.3 15.3  HCT 44.6 44.0  PLT 228 247   BMET:  Recent Labs    03/05/20 1654 03/06/20 0255  NA 136 135  K 4.2 4.2  CL 92* 95*  CO2 30 26  GLUCOSE 104* 153*  BUN 29* 26*  CREATININE 1.05 0.94  CALCIUM 9.5 9.5    PT/INR: No results for input(s): LABPROT, INR in the last 72 hours. ABG    Component Value Date/Time   PHART 7.383 03/02/2020 1509   HCO3 24.4 03/02/2020 1509   TCO2 26 03/02/2020 1509   ACIDBASEDEF 1.0 03/02/2020 1509   O2SAT 86.0 03/02/2020 1509   CBG (last 3)  Recent Labs    03/06/20 0428 03/06/20 0712 03/06/20 0753  GLUCAP 150* 149* 229*    Meds Scheduled Meds: . acetaminophen  1,000 mg Oral Q6H   Or  .  acetaminophen (TYLENOL) oral liquid 160 mg/5 mL  1,000 mg Per Tube Q6H  . aspirin EC  325 mg Oral Daily   Or  . aspirin  324 mg Per Tube Daily  . atorvastatin  80 mg Oral q1800  . bisacodyl  10 mg Oral Daily   Or  . bisacodyl  10 mg Rectal Daily  . chlorhexidine  15 mL Mouth Rinse BID  . Chlorhexidine Gluconate Cloth  6 each Topical Daily  . chlorpheniramine-HYDROcodone  5 mL Oral BID  . diltiazem  120 mg Oral Daily  . docusate sodium  200 mg Oral Daily  . enoxaparin (LOVENOX) injection  40 mg Subcutaneous Q24H  . feeding supplement (ENSURE ENLIVE)  237 mL Oral TID WC  . fluticasone  2 spray Each Nare Daily  . guaiFENesin  1,200 mg Oral BID  . insulin aspart  0-15 Units Subcutaneous TID WC  . insulin aspart  0-5 Units Subcutaneous QHS  . insulin detemir  12 Units Subcutaneous BID  . mouth rinse  15 mL Mouth Rinse q12n4p  . methylPREDNISolone (SOLU-MEDROL) injection  80 mg Intravenous Q12H  . montelukast  10 mg Oral Daily  . pantoprazole  40 mg Oral Daily  . sodium chloride flush  10-40 mL Intracatheter Q12H  . sodium chloride flush  3 mL Intravenous Q12H   Continuous  Infusions: . sodium chloride Stopped (03/03/20 1043)  . sodium chloride Stopped (03/02/20 0520)  . sodium chloride Stopped (03/04/20 0700)  . cefTRIAXone (ROCEPHIN)  IV Stopped (03/05/20 1111)  . lactated ringers    . lactated ringers Stopped (03/03/20 2005)  . nitroGLYCERIN Stopped (03/03/20 1654)   PRN Meds:.sodium chloride, albuterol, ALPRAZolam, hydrALAZINE, ipratropium-albuterol, levalbuterol, metoprolol tartrate, morphine injection, ondansetron (ZOFRAN) IV, oxyCODONE, sodium chloride flush, sodium chloride flush  Xrays DG Chest Port 1 View  Result Date: 03/05/2020 CLINICAL DATA:  Status post CABG. EXAM: PORTABLE CHEST 1 VIEW COMPARISON:  03/04/2020 FINDINGS: Sequelae of CABG are again identified. Bilateral chest tubes remain in place. The cardiomediastinal silhouette is unchanged. Streaky and patchy  opacities in both lung bases are unchanged on the right and stable to mildly improved on the left. The upper lungs are clear. No large pleural effusion or pneumothorax is identified. Subcutaneous emphysema is again noted in the chest wall and neck bilaterally. IMPRESSION: 1. Bilateral chest tubes in place without pneumothorax. Persistent subcutaneous emphysema. 2. Unchanged right and stable to mildly improved left basilar opacities, likely atelectasis. Electronically Signed   By: Sebastian Ache M.D.   On: 03/05/2020 09:28    Assessment/Plan: S/P Procedure(s) (LRB): CORONARY ARTERY BYPASS GRAFTING (CABG) X 3, USING BILATERAL MAMMORY ARTERIES, RIGHT LEG GREATER SAPHENOUS VEIN HARVESTED. (N/A) TRANSESOPHAGEAL ECHOCARDIOGRAM (TEE) (N/A)  1 steady overall progress 2 cont to wean HFNC/pulm RX 3 no fevers but leukocytosis trend is increasing, D#4 Rocephin 4 BS control is fair for inpatient, no DM Hx/Meds- A1C 6.0- on steroids, cont current RX 5 CT with + air leak, no Pntx on CXR- cont same- on H20 seal 6 ambulating well 7 no anemia 8 BUN up slightly with improving trend, normal creat, does not appear volume overloaded 9 poss tx to 2c?  LOS: 7 days    Rowe Clack PA-C Pager [phone] 03/06/2020 Patient seen and examined, agree with above Ambulating well Still has an air leak Leukocytosis likely secondary to steroids- will decrease methylprednisolone Transfer to [employer] C. Dorris Fetch, MD [facility] [phone]

## 2020-03-07 ENCOUNTER — Inpatient Hospital Stay (HOSPITAL_COMMUNITY): Payer: BC Managed Care – PPO

## 2020-03-07 LAB — BASIC METABOLIC PANEL
Anion gap: 13 (ref 5–15)
Anion gap: 14 (ref 5–15)
BUN: 29 mg/dL — ABNORMAL HIGH (ref 6–20)
BUN: 34 mg/dL — ABNORMAL HIGH (ref 6–20)
CO2: 26 mmol/L (ref 22–32)
CO2: 27 mmol/L (ref 22–32)
Calcium: 9 mg/dL (ref 8.9–10.3)
Calcium: 9.1 mg/dL (ref 8.9–10.3)
Chloride: 95 mmol/L — ABNORMAL LOW (ref 98–111)
Chloride: 93 mmol/L — ABNORMAL LOW (ref 98–111)
Creatinine, Ser: 0.94 mg/dL (ref 0.61–1.24)
Creatinine, Ser: 1.08 mg/dL (ref 0.61–1.24)
GFR calc Af Amer: >60 mL/min (ref >60)
GFR calc Af Amer: >60 mL/min (ref >60)
GFR calc non Af Amer: >60 mL/min (ref >60)
GFR calc non Af Amer: >60 mL/min (ref >60)
Glucose, Bld: 133 mg/dL — ABNORMAL HIGH (ref 70–99)
Glucose, Bld: 159 mg/dL — ABNORMAL HIGH (ref 70–99)
Potassium: 4.2 mmol/L (ref 3.5–5.1)
Potassium: 3.9 mmol/L (ref 3.5–5.1)
Sodium: 134 mmol/L — ABNORMAL LOW (ref 135–145)
Sodium: 134 mmol/L — ABNORMAL LOW (ref 135–145)

## 2020-03-07 LAB — GLUCOSE, CAPILLARY
Glucose-Capillary: 127 mg/dL — ABNORMAL HIGH (ref 70–99)
Glucose-Capillary: 173 mg/dL — ABNORMAL HIGH (ref 70–99)
Glucose-Capillary: 158 mg/dL — ABNORMAL HIGH (ref 70–99)
Glucose-Capillary: 174 mg/dL — ABNORMAL HIGH (ref 70–99)

## 2020-03-07 LAB — CBC
HCT: 44.6 % (ref 39.0–52.0)
Hemoglobin: 15.6 g/dL (ref 13.0–17.0)
MCH: 32.4 pg (ref 26.0–34.0)
MCHC: 35 g/dL (ref 30.0–36.0)
MCV: 92.5 fL (ref 80.0–100.0)
Platelets: 270 10*3/uL (ref 150–400)
RBC: 4.82 MIL/uL (ref 4.22–5.81)
RDW: 13.3 % (ref 11.5–15.5)
WBC: 24.1 10*3/uL — ABNORMAL HIGH (ref 4.0–10.5)
nRBC: 0 % (ref 0.0–0.2)

## 2020-03-07 MED ORDER — INSULIN DETEMIR 100 UNIT/ML ~~LOC~~ SOLN
12.0000 [IU] | Freq: Every day | SUBCUTANEOUS | Status: DC
  Administered 2020-03-07 – 2020-03-13 (×6): 12 [IU] via SUBCUTANEOUS
  Filled 2020-03-07 (×8): qty 0.12

## 2020-03-07 MED ORDER — PREDNISONE 20 MG PO TABS
20.0000 mg | ORAL_TABLET | Freq: Every day | ORAL | Status: DC
  Administered 2020-03-09 – 2020-03-14 (×6): 20 mg via ORAL
  Filled 2020-03-07 (×7): qty 1

## 2020-03-07 MED FILL — Sodium Bicarbonate IV Soln 8.4%: INTRAVENOUS | Qty: 100 | Status: AC

## 2020-03-07 MED FILL — Mannitol IV Soln 20%: INTRAVENOUS | Qty: 500 | Status: AC

## 2020-03-07 MED FILL — Sodium Chloride IV Soln 0.9%: INTRAVENOUS | Qty: 2000 | Status: AC

## 2020-03-07 MED FILL — Lidocaine HCl Local Soln Prefilled Syringe 100 MG/5ML (2%): INTRAMUSCULAR | Qty: 10 | Status: AC

## 2020-03-07 MED FILL — Electrolyte-R (PH 7.4) Solution: INTRAVENOUS | Qty: 4000 | Status: AC

## 2020-03-07 MED FILL — Heparin Sodium (Porcine) Inj 1000 Unit/ML: INTRAMUSCULAR | Qty: 20 | Status: AC

## 2020-03-07 NOTE — Progress Notes (Signed)
   Pt seen   Following peripherally   COntinues to progress   Remains in SR   WIll continue to follow peripherally    Plan for f/u in clinic after discharge.  Dietrich Pates MD

## 2020-03-07 NOTE — Progress Notes (Signed)
6 Days Post-Op Procedure(s) (LRB): CORONARY ARTERY BYPASS GRAFTING (CABG) X 3, USING BILATERAL MAMMORY ARTERIES, RIGHT LEG GREATER SAPHENOUS VEIN HARVESTED. (N/A) TRANSESOPHAGEAL ECHOCARDIOGRAM (TEE) (N/A) Subjective: Some pain when first getting up in AM, better with meds No SOB  Objective: Vital signs in last 24 hours: Temp:  [97.5 F (36.4 C)-98.6 F (37 C)] 97.9 F (36.6 C) (04/22 0744) Pulse Rate:  [81-109] 93 (04/22 0744) Cardiac Rhythm: Normal sinus rhythm (04/22 0700) Resp:  [13-20] 17 (04/22 0744) BP: (106-138)/(76-96) 135/87 (04/22 0744) SpO2:  [90 %-100 %] 97 % (04/22 0744) Weight:  [86.9 kg] 86.9 kg (04/22 0402)  Hemodynamic parameters for last 24 hours:    Intake/Output from previous day: 04/21 0701 - 04/22 0700 In: 106.1 [I.V.:6; IV Piggyback:100.1] Out: 1815 [Urine:1775; Chest Tube:40] Intake/Output this shift: No intake/output data recorded.  General appearance: alert, cooperative and no distress Neurologic: intact Heart: regular rate and rhythm Lungs: faint wheezes bilaterally Wound: clean and dry  Lab Results: Recent Labs    03/06/20 0255 03/07/20 0334  WBC 23.5* 24.1*  HGB 15.3 15.6  HCT 44.0 44.6  PLT 247 270   BMET:  Recent Labs    03/06/20 1618 03/07/20 0334  NA 133* 134*  K 4.0 4.2  CL 91* 95*  CO2 28 26  GLUCOSE 124* 133*  BUN 34* 29*  CREATININE 1.09 0.94  CALCIUM 9.1 9.0    PT/INR: No results for input(s): LABPROT, INR in the last 72 hours. ABG    Component Value Date/Time   PHART 7.383 03/02/2020 1509   HCO3 24.4 03/02/2020 1509   TCO2 26 03/02/2020 1509   ACIDBASEDEF 1.0 03/02/2020 1509   O2SAT 86.0 03/02/2020 1509   CBG (last 3)  Recent Labs    03/06/20 1550 03/06/20 2106 03/07/20 0627  GLUCAP 121* 116* 127*    Assessment/Plan: S/P Procedure(s) (LRB): CORONARY ARTERY BYPASS GRAFTING (CABG) X 3, USING BILATERAL MAMMORY ARTERIES, RIGHT LEG GREATER SAPHENOUS VEIN HARVESTED. (N/A) TRANSESOPHAGEAL ECHOCARDIOGRAM  (TEE) (N/A) -POD # 6 CABG CV- stable in SR RESP- overall improved with far less O2 requirement  Air leak persists- will discuss possible IBV placement with him later today RENAL- creatinine stable ENDO- CBG reasonably well controlled should improve as steroids weaned Continue cardiac rehab SCD + enoxaparin   LOS: 8 days    Loreli Slot 03/07/2020

## 2020-03-07 NOTE — Progress Notes (Signed)
CARDIAC REHAB PHASE I   PRE:  Rate/Rhythm: 107 ST  BP:  Sitting: 127/93      SaO2: 92 4L  MODE:  Ambulation: 800 ft   POST:  Rate/Rhythm: 115 ST  BP:  Sitting: 135/94    SaO2: 94 4L  Pt ambulated 855ft in hallway with front wheel walker and steady gait. Pt denies pain, SOB, or dizziness throughout the walk. Pt returned to room. Demonstrating 1250 on IS. Encouraged continued ambulation and IS use. Ok to walk in hall with wife after O2 set-up. Pt able to place CT on walker without difficulty. Will continue to follow and encourage ambulation.  [id number] Reynold Bowen, RN BSN 03/07/2020 10:54 AM

## 2020-03-07 NOTE — Progress Notes (Signed)
      [address]       [name] [address]             [phone]      Mr. Rapley's air leak on the left developed while on BIPAP over the weekend. It has not changed significantly over the course of the week, and is unlikely to resolve on its own given the severity of the underlying emphysema. I recommended we proceed with Bronchoscopy for intrabronchial valve placement. Other options are continued chest tube drainage and VATS for bleb stapling. I favor valves as chest tube alone unlikely to be successful and IBV do not require surgery. He understands the valves will need to be removed in 6-8 weeks.  I informed Mr and Mrs Mazer of the indications, risks, benefits and alternatives. They understand the risks include those associated with general anesthesia as well as valve malposition and failure of the valves to seal the leak.   He accepts the risks and agrees to proceed. Plan to do in OR tomorrow   Viviann Spare C. Dorris Fetch, MD [facility] [phone]

## 2020-03-07 NOTE — Plan of Care (Signed)

## 2020-03-08 ENCOUNTER — Encounter (HOSPITAL_COMMUNITY): Payer: Self-pay | Admitting: Thoracic Surgery (Cardiothoracic Vascular Surgery)

## 2020-03-08 ENCOUNTER — Inpatient Hospital Stay (HOSPITAL_COMMUNITY): Payer: BC Managed Care – PPO | Admitting: Certified Registered"

## 2020-03-08 ENCOUNTER — Inpatient Hospital Stay (HOSPITAL_COMMUNITY): Payer: BC Managed Care – PPO

## 2020-03-08 ENCOUNTER — Encounter (HOSPITAL_COMMUNITY)
Admission: AD | Disposition: A | Discharge status: Home / Self Care | Payer: Self-pay | Source: Other Acute Inpatient Hospital | Attending: Thoracic Surgery (Cardiothoracic Vascular Surgery)

## 2020-03-08 DIAGNOSIS — J9382 Other air leak: Secondary | ICD-10-CM

## 2020-03-08 HISTORY — PX: VIDEO BRONCHOSCOPY WITH INSERTION OF INTERBRONCHIAL VALVE (IBV): SHX6178

## 2020-03-08 LAB — GLUCOSE, CAPILLARY
Glucose-Capillary: 90 mg/dL (ref 70–99)
Glucose-Capillary: 134 mg/dL — ABNORMAL HIGH (ref 70–99)
Glucose-Capillary: 146 mg/dL — ABNORMAL HIGH (ref 70–99)

## 2020-03-08 SURGERY — BRONCHOSCOPY, FLEXIBLE, WITH INTRABRONCHIAL VALVE INSERTION
Anesthesia: General

## 2020-03-08 MED ORDER — FENTANYL CITRATE (PF) 250 MCG/5ML IJ SOLN
INTRAMUSCULAR | Status: AC
  Filled 2020-03-08: qty 5

## 2020-03-08 MED ORDER — DEXAMETHASONE SODIUM PHOSPHATE 10 MG/ML IJ SOLN
INTRAMUSCULAR | Status: DC | PRN
  Administered 2020-03-08: 10 mg via INTRAVENOUS

## 2020-03-08 MED ORDER — SUGAMMADEX SODIUM 200 MG/2ML IV SOLN
INTRAVENOUS | Status: DC | PRN
  Administered 2020-03-08: 200 mg via INTRAVENOUS

## 2020-03-08 MED ORDER — HYDROMORPHONE HCL 1 MG/ML IJ SOLN: 0.2500 mg | INTRAMUSCULAR | Status: DC | PRN

## 2020-03-08 MED ORDER — PROPOFOL 10 MG/ML IV BOLUS
INTRAVENOUS | Status: DC | PRN
  Administered 2020-03-08: 150 mg via INTRAVENOUS

## 2020-03-08 MED ORDER — 0.9 % SODIUM CHLORIDE (POUR BTL) OPTIME
TOPICAL | Status: DC | PRN
  Administered 2020-03-08: 1000 mL

## 2020-03-08 MED ORDER — ESMOLOL HCL 100 MG/10ML IV SOLN
INTRAVENOUS | Status: AC
  Filled 2020-03-08: qty 10

## 2020-03-08 MED ORDER — MIDAZOLAM HCL 2 MG/2ML IJ SOLN
INTRAMUSCULAR | Status: DC | PRN
  Administered 2020-03-08: 2 mg via INTRAVENOUS

## 2020-03-08 MED ORDER — ACETAMINOPHEN 500 MG PO TABS: 1000.0000 mg | ORAL_TABLET | Freq: Once | ORAL | Status: DC

## 2020-03-08 MED ORDER — FENTANYL CITRATE (PF) 100 MCG/2ML IJ SOLN
INTRAMUSCULAR | Status: DC | PRN
  Administered 2020-03-08: 100 ug via INTRAVENOUS

## 2020-03-08 MED ORDER — ESMOLOL HCL 100 MG/10ML IV SOLN
INTRAVENOUS | Status: DC | PRN
  Administered 2020-03-08: 30 mg via INTRAVENOUS
  Administered 2020-03-08: 20 mg via INTRAVENOUS

## 2020-03-08 MED ORDER — PROPOFOL 10 MG/ML IV BOLUS
INTRAVENOUS | Status: AC
  Filled 2020-03-08: qty 20

## 2020-03-08 MED ORDER — ACETAMINOPHEN 10 MG/ML IV SOLN: 1000.0000 mg | Freq: Once | INTRAVENOUS | Status: DC | PRN

## 2020-03-08 MED ORDER — LACTATED RINGERS IV SOLN: INTRAVENOUS | Status: DC

## 2020-03-08 MED ORDER — ROCURONIUM BROMIDE 10 MG/ML (PF) SYRINGE
PREFILLED_SYRINGE | INTRAVENOUS | Status: DC | PRN
  Administered 2020-03-08: 50 mg via INTRAVENOUS

## 2020-03-08 MED ORDER — MIDAZOLAM HCL 2 MG/2ML IJ SOLN
INTRAMUSCULAR | Status: AC
  Filled 2020-03-08: qty 2

## 2020-03-08 MED ORDER — PROMETHAZINE HCL 25 MG/ML IJ SOLN: 6.2500 mg | INTRAMUSCULAR | Status: DC | PRN

## 2020-03-08 MED ORDER — LIDOCAINE 2% (20 MG/ML) 5 ML SYRINGE
INTRAMUSCULAR | Status: DC | PRN
  Administered 2020-03-08: 60 mg via INTRAVENOUS

## 2020-03-08 MED ORDER — PHENYLEPHRINE 40 MCG/ML (10ML) SYRINGE FOR IV PUSH (FOR BLOOD PRESSURE SUPPORT)
PREFILLED_SYRINGE | INTRAVENOUS | Status: AC
  Filled 2020-03-08: qty 10

## 2020-03-08 MED ORDER — PHENYLEPHRINE 40 MCG/ML (10ML) SYRINGE FOR IV PUSH (FOR BLOOD PRESSURE SUPPORT)
PREFILLED_SYRINGE | INTRAVENOUS | Status: DC | PRN
  Administered 2020-03-08 (×3): 80 ug via INTRAVENOUS

## 2020-03-08 SURGICAL SUPPLY — 38 items
ADAPTER VALVE BIOPSY EBUS (MISCELLANEOUS) IMPLANT
ADPTR VALVE BIOPSY EBUS (MISCELLANEOUS)
BLADE CLIPPER SURG (BLADE) ×3 IMPLANT
CANISTER SUCT 3000ML PPV (MISCELLANEOUS) ×3 IMPLANT
CATH EMB 5FR 80CM (CATHETERS) ×2 IMPLANT
CATH LOADER DEPLOYMENT HUD (CATHETERS) ×2 IMPLANT
CNTNR URN SCR LID CUP LEK RST (MISCELLANEOUS) ×1 IMPLANT
CONT SPEC 4OZ STRL OR WHT (MISCELLANEOUS) ×2
COVER BACK TABLE 60X90IN (DRAPES) ×3 IMPLANT
FILTER STRAW FLUID ASPIR (MISCELLANEOUS) IMPLANT
FORCEPS BIOP RJ4 1.8 (CUTTING FORCEPS) IMPLANT
GAUZE SPONGE 4X4 12PLY STRL (GAUZE/BANDAGES/DRESSINGS) ×3 IMPLANT
GLOVE SURG SIGNA 7.5 PF LTX (GLOVE) ×3 IMPLANT
GOWN STRL REUS W/ TWL XL LVL3 (GOWN DISPOSABLE) ×1 IMPLANT
GOWN STRL REUS W/TWL XL LVL3 (GOWN DISPOSABLE) ×2
KIT CLEAN ENDO COMPLIANCE (KITS) ×3 IMPLANT
KIT TURNOVER KIT B (KITS) ×3 IMPLANT
MARKER SKIN DUAL TIP RULER LAB (MISCELLANEOUS) ×3 IMPLANT
NS IRRIG 1000ML POUR BTL (IV SOLUTION) ×3 IMPLANT
OIL SILICONE PENTAX (PARTS (SERVICE/REPAIRS)) IMPLANT
PAD ARMBOARD 7.5X6 YLW CONV (MISCELLANEOUS) ×6 IMPLANT
STOPCOCK MORSE 400PSI 3WAY (MISCELLANEOUS) ×3 IMPLANT
SYR 10ML LL (SYRINGE) ×3 IMPLANT
SYR 20ML ECCENTRIC (SYRINGE) ×3 IMPLANT
SYSTEM SAHARA CHEST DRAIN ATS (WOUND CARE) ×2 IMPLANT
TOWEL GREEN STERILE (TOWEL DISPOSABLE) ×3 IMPLANT
TOWEL GREEN STERILE FF (TOWEL DISPOSABLE) ×3 IMPLANT
TOWEL NATURAL 4PK STERILE (DISPOSABLE) ×3 IMPLANT
TRAP SPECIMEN MUCOUS 40CC (MISCELLANEOUS) ×3 IMPLANT
TUBE CONNECTING 20'X1/4 (TUBING) ×1
TUBE CONNECTING 20X1/4 (TUBING) ×2 IMPLANT
UNDERPAD 30X30 (UNDERPADS AND DIAPERS) ×3 IMPLANT
VALVE BIOPSY  SINGLE USE (MISCELLANEOUS) ×2
VALVE BIOPSY SINGLE USE (MISCELLANEOUS) ×1 IMPLANT
VALVE IN CARTRIDGE 6MM HUD (Valve) ×2 IMPLANT
VALVE IN CARTRIDGE 9MM HUD (Valve) ×2 IMPLANT
VALVE SUCTION BRONCHIO DISP (MISCELLANEOUS) ×3 IMPLANT
WATER STERILE IRR 1000ML POUR (IV SOLUTION) ×3 IMPLANT

## 2020-03-08 NOTE — Anesthesia Procedure Notes (Signed)
Procedure Name: Intubation Date/Time: 03/08/2020 12:00 PM Performed by: Lavell Luster, CRNA Pre-anesthesia Checklist: Patient identified, Emergency Drugs available, Suction available, Patient being monitored and Timeout performed Patient Re-evaluated:Patient Re-evaluated prior to induction Oxygen Delivery Method: Circle system utilized Preoxygenation: Pre-oxygenation with 100% oxygen Induction Type: IV induction Ventilation: Mask ventilation without difficulty Laryngoscope Size: Mac and 4 Grade View: Grade I Tube type: Oral Tube size: 8.5 mm Number of attempts: 1 Airway Equipment and Method: Stylet Placement Confirmation: ETT inserted through vocal cords under direct vision,  positive ETCO2 and breath sounds checked- equal and bilateral Secured at: 23 cm Tube secured with: Tape Dental Injury: Teeth and Oropharynx as per pre-operative assessment

## 2020-03-08 NOTE — Progress Notes (Signed)
CARDIAC REHAB PHASE I   Went to offer to walk with pt Pt on phone, waiting for Or. Will return to ambulate as able.  Reynold Bowen, RN BSN 03/08/2020 9:09 AM

## 2020-03-08 NOTE — Op Note (Signed)
NAMEJAIDON, ELLERY MEDICAL RECORD [id number] ACCOUNT [id number] DATE OF BIRTH:11/26/1965 FACILITY: [facility] LOCATION: [facility] PHYSICIAN:Steve Youngberg Lars Pinks, MD  OPERATIVE REPORT  DATE OF PROCEDURE:  03/08/2020  PREOPERATIVE DIAGNOSIS:  Persistent air leak.  POSTOPERATIVE DIAGNOSIS:  Persistent air leak.  PROCEDURES:   1.  Bronchoscopy. 2.  Intrabronchial valve placement (9 mm valve in left upper lobe bronchus, 6 mm valve in lingular bronchus).  SURGEON:  Charlett Lango, MD  ASSISTANT:  None.  ANESTHESIA:  General.  FINDINGS:  Air leak markedly diminished with occlusion of left upper lobe bronchus.  No additional decrease with occlusion of lower lobe bronchus.  Diminished air leak after valve placement.  CLINICAL NOTE:  Mr. Stockham is a 54 year old gentleman who had undergone coronary artery bypass grafting.  In the early postoperative period, he had respiratory failure requiring noninvasive positive pressure ventilation.  He developed a large air leak  that was persistent.  He was advised to undergo an off-label intrabronchial valve placement to try to decrease the air leak and hopefully eliminate it all together.  The indications, risks, benefits, alternatives and likelihood of success were discussed  in detail with the patient.  He understood and accepted the risks and agreed to proceed.  OPERATIVE NOTE:  Mr. Razon was brought to the operating room on 03/08/2020.  He had induction of general anesthesia and was intubated.  With positive pressure ventilation, the air leak resolved.  The patient then was managed with deep sedation, but  spontaneous respiration, then the air leak was again visible.  A timeout was performed.  Flexible fiberoptic bronchoscopy was performed via the endotracheal tube.  There were some thick secretions bilaterally.  These were cleared with saline and the washings were sent for culture.  The bronchoscope was directed to  the left mainstem bronchus and  occlusion of the left mainstem resulted in resolution of the air leak.  Occlusion of the left upper lobe bronchus resulted in a marked decrease in the air leak, but not complete resolution.  Occlusion of the lower lobe  airway had no significant effect on the air leak.  Decision was made to place valves in the left upper lobe bronchus.  The lingular segmental bronchus sized for a 6 mm valve and the remainder of the left upper lobe bronchus sized for a 9 mm valve.  The  valves were both deployed.  Because of the anatomy of the upper lobe bronchus, the valve there was at a slight angle, but did appear to completely occlude the airways.  There was a marked decrease, but not complete resolution of the air leak after  placement of the valves.  The patient's epicardial pacemaker wires which were no longer needed were removed.  He then was extubated in the operating room and taken to the Postanesthetic Care Unit in good condition.  VN/NUANCE  D:03/08/2020 T:03/08/2020 JOB:[id number]

## 2020-03-08 NOTE — Transfer of Care (Signed)
Immediate Anesthesia Transfer of Care Note  Patient: Sean Joseph  Procedure(s) Performed: VIDEO BRONCHOSCOPY WITH INSERTION OF INTERBRONCHIAL VALVE (IBV) (N/A )  Patient Location: PACU  Anesthesia Type:General  Level of Consciousness: awake, alert  and oriented  Airway & Oxygen Therapy: Patient connected to face mask oxygen  Post-op Assessment: Post -op Vital signs reviewed and stable  Post vital signs: stable  Last Vitals:  Vitals Value Taken Time  BP 117/71 03/08/20 1252  Temp    Pulse 119 03/08/20 1253  Resp 25 03/08/20 1253  SpO2 93 % 03/08/20 1253  Vitals shown include unvalidated device data.  Last Pain:  Vitals:   03/08/20 0825  TempSrc: Oral  PainSc: 2       Patients Stated Pain Goal: 0 (03/07/20 1933)  Complications: No apparent anesthesia complications

## 2020-03-08 NOTE — Plan of Care (Signed)
  Problem: Education: Goal: Knowledge of General Education information will improve Description Including pain rating scale, medication(s)/side effects and non-pharmacologic comfort measures Outcome: Progressing   

## 2020-03-08 NOTE — OR Nursing (Signed)
Remaining pacing wires removed by Dr. Dorris Fetch after IBV valve placement.

## 2020-03-08 NOTE — Plan of Care (Signed)

## 2020-03-08 NOTE — TOC Progression Note (Addendum)
Transition of Care [facility]) - Progression Note    Patient Details  Name: Sean Joseph MRN: [id number] Date of Birth: 1966/07/05  [facility] CM/SW Contact  Leone Haven, RN Phone Number: 03/08/2020, 2:13 PM  Clinical Narrative:    Bronchialscopy with insertion of valve to see if can stop air leaks, has small ptx. TOC team will continue to follow for dc needs.         Expected Discharge Plan and Services                                                 Social Determinants of Health (SDOH) Interventions    Readmission Risk Interventions No flowsheet data found.

## 2020-03-08 NOTE — Anesthesia Preprocedure Evaluation (Addendum)
Anesthesia Evaluation  Patient identified by MRN, date of birth, ID band Patient awake    Reviewed: Allergy & Precautions, NPO status , Patient's Chart, lab work & pertinent test results  Airway Mallampati: II  TM Distance: >3 FB Neck ROM: Full    Dental no notable dental hx.    Pulmonary Current Smoker and Patient abstained from smoking.,    Pulmonary exam normal breath sounds clear to auscultation       Cardiovascular + CAD, + Past MI and + CABG  Normal cardiovascular exam Rhythm:Regular Rate:Normal  ECG: rate 90   Neuro/Psych negative neurological ROS  negative psych ROS   GI/Hepatic negative GI ROS, Neg liver ROS,   Endo/Other  negative endocrine ROS  Renal/GU negative Renal ROS     Musculoskeletal negative musculoskeletal ROS (+)   Abdominal (+) + obese,   Peds  Hematology negative hematology ROS (+)   Anesthesia Other Findings AIRLEAK  Reproductive/Obstetrics                            Anesthesia Physical Anesthesia Plan  ASA: III  Anesthesia Plan: General   Post-op Pain Management:    Induction: Intravenous  PONV Risk Score and Plan: 1 and Ondansetron, Dexamethasone, Midazolam and Treatment may vary due to age or medical condition  Airway Management Planned: Oral ETT  Additional Equipment:   Intra-op Plan:   Post-operative Plan: Extubation in OR  Informed Consent: I have reviewed the patients History and Physical, chart, labs and discussed the procedure including the risks, benefits and alternatives for the proposed anesthesia with the patient or authorized representative who has indicated his/her understanding and acceptance.     Dental advisory given  Plan Discussed with: CRNA  Anesthesia Plan Comments:        Anesthesia Quick Evaluation

## 2020-03-08 NOTE — Anesthesia Postprocedure Evaluation (Signed)
Anesthesia Post Note  Patient: [name]  Procedure(s) Performed: VIDEO BRONCHOSCOPY WITH INSERTION OF INTERBRONCHIAL VALVE (IBV) (N/A )     Patient location during evaluation: PACU Anesthesia Type: General Level of consciousness: awake Pain management: pain level controlled Vital Signs Assessment: post-procedure vital signs reviewed and stable Respiratory status: spontaneous breathing, nonlabored ventilation, respiratory function stable and patient connected to nasal cannula oxygen Cardiovascular status: blood pressure returned to baseline and stable Postop Assessment: no apparent nausea or vomiting Anesthetic complications: no    Last Vitals:  Vitals:   03/08/20 1346 03/08/20 1347  BP:    Pulse: (!) 105 (!) 105  Resp: 20 18  Temp:  (!) 36.2 C  SpO2: 91% 91%    Last Pain:  Vitals:   03/08/20 1347  TempSrc:   PainSc: 0-No pain                 Teela Narducci P Trong Gosling

## 2020-03-08 NOTE — Progress Notes (Addendum)
      [address]       [place] [address]             [phone]        7 Days Post-Op Procedure(s) (LRB): CORONARY ARTERY BYPASS GRAFTING (CABG) X 3, USING BILATERAL MAMMORY ARTERIES, RIGHT LEG GREATER SAPHENOUS VEIN HARVESTED. (N/A) TRANSESOPHAGEAL ECHOCARDIOGRAM (TEE) (N/A)  Subjective: Patient talking on phone. He has no specific complaints this am.  Objective: Vital signs in last 24 hours: Temp:  [97.7 F (36.5 C)-98.1 F (36.7 C)] 98.1 F (36.7 C) (04/23 0825) Pulse Rate:  [69-103] 69 (04/23 0825) Cardiac Rhythm: Normal sinus rhythm (04/23 0700) Resp:  [11-18] 15 (04/23 0825) BP: (119-129)/(70-116) 129/116 (04/23 0825) SpO2:  [92 %-95 %] 93 % (04/23 0825) Weight:  [87 kg] 87 kg (04/23 0626)  Pre op weight 90.5 kg Current Weight  03/08/20 87 kg      Intake/Output from previous day: 04/22 0701 - 04/23 0700 In: 1203 [P.O.:1200; I.V.:3] Out: 40 [Chest Tube:40]   Physical Exam:  Cardiovascular: RRR Pulmonary: Clear to auscultation bilaterally Abdomen: Soft, non tender, bowel sounds present. Extremities: Trace bilateral lower extremity edema. Wounds: Clean and dry.  No erythema or signs of infection.  Lab Results: CBC: Recent Labs    03/06/20 0255 03/07/20 0334  WBC 23.5* 24.1*  HGB 15.3 15.6  HCT 44.0 44.6  PLT 247 270   BMET:  Recent Labs    03/07/20 0334 03/07/20 1541  NA 134* 134*  K 4.2 3.9  CL 95* 93*  CO2 26 27  GLUCOSE 133* 159*  BUN 29* 34*  CREATININE 0.94 1.08  CALCIUM 9.0 9.1    PT/INR:  Lab Results  Component Value Date   INR 1.1 03/01/2020   ABG:  INR: Will add last result for INR, ABG once components are confirmed Will add last 4 CBG results once components are confirmed  Assessment/Plan:  1. CV - S/P NSTEMI. SR with HR in the 80's this am. On Cardizem CD 120 mg daily 2.  Pulmonary - Patient with a history of emphysema and he developed an air leak while on bipap over this past weekend. Because  of the persistent air leak that will require bronchoscopy and intrabronchial valve placement, scheduled for today. Will order CXR for am. Encourage incentive spirometer.  3. CBGs 158/174/90. On Prednisone, no history of diabetes. 4. Will remove EPW in am  5. Hopefully, home this weekend   Lelon Huh [facility] 03/08/2020,8:51 AM Patient seen and examined, agree with above For IBV placement today, all questions answered  Salvatore Decent. Dorris Fetch, MD [facility] [phone]

## 2020-03-08 NOTE — Brief Op Note (Signed)
03/08/2020  12:46 PM  PATIENT:  Sean Joseph  54 y.o. male  PRE-OPERATIVE DIAGNOSIS:  AIRLEAK  POST-OPERATIVE DIAGNOSIS:  AIRLEAK  PROCEDURE:  Procedure(s): VIDEO BRONCHOSCOPY WITH INSERTION OF INTERBRONCHIAL VALVE (IBV) (N/A)  9 mm valve in main LUL bronchus, 6 mm valve in lingular bronchus  SURGEON:  Surgeon(s) and Role:    * Loreli Slot, MD - Primary  PHYSICIAN ASSISTANT:   ASSISTANTS: none   ANESTHESIA:   general  EBL:  none  BLOOD ADMINISTERED:none  DRAINS: none   LOCAL MEDICATIONS USED:  NONE  SPECIMEN:  Source of Specimen:  Bronchial washings  DISPOSITION OF SPECIMEN:  micro  COUNTS:  NO endo  TOURNIQUET:  * No tourniquets in log *  DICTATION: .Other Dictation: Dictation Number -  PLAN OF CARE: Admit to inpatient   PATIENT DISPOSITION:  PACU - hemodynamically stable.   Delay start of Pharmacological VTE agent (>24hrs) due to surgical blood loss or risk of bleeding: no

## 2020-03-08 NOTE — Interval H&P Note (Signed)
History and Physical Interval Note:  Post CABG had acute on chronic respiratory failure requiring non invasive positive pressure ventilation. Developed an air leak on the left side. Air leak is persistent. For off label placement of IBV for management of the air leak today. 03/08/2020 11:01 AM  Sean Joseph  has presented today for surgery, with the diagnosis of AIRLEAK.  The various methods of treatment have been discussed with the patient and family. After consideration of risks, benefits and other options for treatment, the patient has consented to  Procedure(s): VIDEO BRONCHOSCOPY WITH INSERTION OF INTERBRONCHIAL VALVE (IBV) (N/A) as a surgical intervention.  The patient's history has been reviewed, patient examined, no change in status, stable for surgery.  I have reviewed the patient's chart and labs.  Questions were answered to the patient's satisfaction.     Loreli Slot

## 2020-03-09 ENCOUNTER — Inpatient Hospital Stay (HOSPITAL_COMMUNITY): Payer: BC Managed Care – PPO

## 2020-03-09 LAB — GLUCOSE, CAPILLARY
Glucose-Capillary: 123 mg/dL — ABNORMAL HIGH (ref 70–99)
Glucose-Capillary: 142 mg/dL — ABNORMAL HIGH (ref 70–99)
Glucose-Capillary: 144 mg/dL — ABNORMAL HIGH (ref 70–99)
Glucose-Capillary: 197 mg/dL — ABNORMAL HIGH (ref 70–99)
Glucose-Capillary: 91 mg/dL (ref 70–99)

## 2020-03-09 NOTE — Progress Notes (Signed)
Pt is ambulating in a hallway independently, chest tube site is CDI, chest tube is in -20cm suction, air leaking moderate, output 0 cc on this shift, pt denies pain, will continue to monitor the patient  Lonia Farber, RN

## 2020-03-09 NOTE — Progress Notes (Addendum)
      [address]       [name] [address]             [phone]        1 Day Post-Op Procedure(s) (LRB): VIDEO BRONCHOSCOPY WITH INSERTION OF INTERBRONCHIAL VALVE (IBV) (N/A)  Subjective:  He has no specific complaints this am. He had a bowel movement this am. He does state he likes medication for heartburn that he is being given here because it works better.  Objective: Vital signs in last 24 hours: Temp:  [97.2 F (36.2 C)-98.4 F (36.9 C)] 98.4 F (36.9 C) (04/24 0744) Pulse Rate:  [75-122] 80 (04/24 0744) Cardiac Rhythm: Normal sinus rhythm (04/24 0701) Resp:  [13-22] 14 (04/24 0744) BP: (109-131)/(66-93) 130/89 (04/24 0744) SpO2:  [87 %-95 %] 93 % (04/24 0744) Weight:  [86.1 kg] 86.1 kg (04/24 0445)  Pre op weight 90.5 kg Current Weight  03/09/20 86.1 kg      Intake/Output from previous day: 04/23 0701 - 04/24 0700 In: 1155 [P.O.:480; I.V.:675] Out: 2254 [Urine:2200; Chest Tube:54]   Physical Exam:  Cardiovascular: RRR Pulmonary: Clear to auscultation bilaterally Abdomen: Soft, non tender, bowel sounds present. Extremities: Trace bilateral lower extremity edema. Wounds: Clean and dry.  No erythema or signs of infection.  Lab Results: CBC: Recent Labs    03/07/20 0334  WBC 24.1*  HGB 15.6  HCT 44.6  PLT 270   BMET:  Recent Labs    03/07/20 0334 03/07/20 1541  NA 134* 134*  K 4.2 3.9  CL 95* 93*  CO2 26 27  GLUCOSE 133* 159*  BUN 29* 34*  CREATININE 0.94 1.08  CALCIUM 9.0 9.1    PT/INR:  Lab Results  Component Value Date   INR 1.1 03/01/2020   ABG:  INR: Will add last result for INR, ABG once components are confirmed Will add last 4 CBG results once components are confirmed  Assessment/Plan:  1. CV - S/P NSTEMI. SR with HR in the 80's this am. On Cardizem CD 120 mg daily 2.  Pulmonary - Patient with a history of emphysema and he developed an air leak while on bipap over this past weekend. Because of the  persistent air leak , he underwent a bronchoscopy and intrabronchial valve placement 04/23. On [place]. Chest tube with 54 cc last 24 hours. Chest tube is to suction and there is a +1 air leak. CXR this am appears stable. As discussed with Dr. Laneta Simmers, will place chest tube to water seal. Encourage incentive spirometer.  3. CBGs 146/123/142. On Prednisone, no history of diabetes. 4. Patient requesting prescription for Protonix at discharge    Donielle M ZimmermanPA-C 03/09/2020,10:14 AM   Chart reviewed, patient examined, agree with above. Small intermittent air leak from chest tube. Will put chest tube to water seal.

## 2020-03-09 NOTE — Progress Notes (Signed)
Cardiac Rehab Phase I Per patient's RN, patient ambulated multiple times independently. Per patient he ambulated about an hour ago. CABG education completed with patient and patient's wife including sternal precautions, IS use, daily weights and when to call physician versus 911, restrictions, risk factor modification, heart healthy diet, and activity progression. Discussed tobacco cessation, and patient is ready to quit, hand out given. Discussed phase 2 cardiac rehab, and pt is interested in the program at [facility] in [place], will send referral. Pt verbalizes understanding.  Artist Pais, MS, ACSM [employer] 03/09/2020 [phone]

## 2020-03-10 ENCOUNTER — Inpatient Hospital Stay (HOSPITAL_COMMUNITY): Payer: BC Managed Care – PPO

## 2020-03-10 LAB — CULTURE, RESPIRATORY W GRAM STAIN: Culture: NORMAL

## 2020-03-10 LAB — ACID FAST SMEAR (AFB, MYCOBACTERIA): Acid Fast Smear: NEGATIVE

## 2020-03-10 LAB — GLUCOSE, CAPILLARY
Glucose-Capillary: 103 mg/dL — ABNORMAL HIGH (ref 70–99)
Glucose-Capillary: 99 mg/dL (ref 70–99)
Glucose-Capillary: 103 mg/dL — ABNORMAL HIGH (ref 70–99)
Glucose-Capillary: 97 mg/dL (ref 70–99)

## 2020-03-10 NOTE — Progress Notes (Signed)
Pt is ambulating in a hallway independently, chest tube site is CDI, chest tube is in water seal since 10 am, air leaking minimal, output is recorded in epic, pt denies pain, will continue to monitor the patient  Lonia Farber, RN

## 2020-03-10 NOTE — Progress Notes (Addendum)
      [address]       [name] [address]             [phone]        2 Days Post-Op Procedure(s) (LRB): VIDEO BRONCHOSCOPY WITH INSERTION OF INTERBRONCHIAL VALVE (IBV) (N/A)  Subjective:  He has no specific complaints this am.  Objective: Vital signs in last 24 hours: Temp:  [97.4 F (36.3 C)-98.3 F (36.8 C)] 97.6 F (36.4 C) (04/25 0746) Pulse Rate:  [70-95] 95 (04/25 0746) Cardiac Rhythm: Normal sinus rhythm (04/25 0706) Resp:  [11-19] 16 (04/25 0746) BP: (116-127)/(72-83) 119/82 (04/25 0746) SpO2:  [92 %-95 %] 92 % (04/25 0746) Weight:  [86.5 kg] 86.5 kg (04/25 0451)  Pre op weight 90.5 kg Current Weight  03/10/20 86.5 kg      Intake/Output from previous day: 04/24 0701 - 04/25 0700 In: 720 [P.O.:720] Out: 1874 [Urine:1850; Chest Tube:24]   Physical Exam:  Cardiovascular: RRR Pulmonary: Clear to auscultation bilaterally Abdomen: Soft, non tender, bowel sounds present. Extremities: Trace bilateral lower extremity edema. Wounds: Clean and dry.  No erythema or signs of infection. Chest tube: is to 10 cm of suction this am with intermittent +1 air leak  Lab Results: CBC: No results for input(s): WBC, HGB, HCT, PLT in the last 72 hours. BMET:  Recent Labs    03/07/20 1541  NA 134*  K 3.9  CL 93*  CO2 27  GLUCOSE 159*  BUN 34*  CREATININE 1.08  CALCIUM 9.1    PT/INR:  Lab Results  Component Value Date   INR 1.1 03/01/2020   ABG:  INR: Will add last result for INR, ABG once components are confirmed Will add last 4 CBG results once components are confirmed  Assessment/Plan:  1. CV - S/P NSTEMI. SR with HR in the 80's this am. On Cardizem CD 120 mg daily 2.  Pulmonary - Patient with a history of emphysema and he developed an air leak while on bipap over this past weekend. Because of the persistent air leak , he underwent a bronchoscopy and intrabronchial valve placement 04/23. On [place]. Chest tube with 24 cc last 24 hours.  Chest tube is to 10 cm suction this am so will place water seal  and there is an intermittent  +1 air leak. CXR this am appears stable. May need to go home with mini express. Encourage incentive spirometer.  3. CBGs 144/91/103. On Prednisone, no history of diabetes. 4. Patient requesting prescription for Protonix at discharge    Lelon Huh [facility] 03/10/2020,9:23 AM   Chart reviewed, patient examined, agree with above. Will keep tube to water seal and if air leak does not stop plan home with mini-express.

## 2020-03-11 ENCOUNTER — Inpatient Hospital Stay (HOSPITAL_COMMUNITY): Payer: BC Managed Care – PPO

## 2020-03-11 ENCOUNTER — Encounter: Payer: Self-pay | Admitting: *Deleted

## 2020-03-11 LAB — GLUCOSE, CAPILLARY
Glucose-Capillary: 95 mg/dL (ref 70–99)
Glucose-Capillary: 124 mg/dL — ABNORMAL HIGH (ref 70–99)
Glucose-Capillary: 105 mg/dL — ABNORMAL HIGH (ref 70–99)
Glucose-Capillary: 164 mg/dL — ABNORMAL HIGH (ref 70–99)

## 2020-03-11 NOTE — Progress Notes (Addendum)
[address]       [name] [address]             [phone]      3 Days Post-Op Procedure(s) (LRB): VIDEO BRONCHOSCOPY WITH INSERTION OF INTERBRONCHIAL VALVE (IBV) (N/A) Subjective: Feels well, some SOB with ambulation but comfortable at rest  Objective: Vital signs in last 24 hours: Temp:  [97.6 F (36.4 C)-98.7 F (37.1 C)] 97.6 F (36.4 C) (04/26 0720) Pulse Rate:  [70-95] 89 (04/26 0720) Cardiac Rhythm: Normal sinus rhythm (04/26 0400) Resp:  [12-20] 20 (04/26 0720) BP: (111-130)/(73-89) 124/89 (04/26 0720) SpO2:  [92 %-96 %] 92 % (04/26 0720) Weight:  [86.7 kg] 86.7 kg (04/26 0530)  Hemodynamic parameters for last 24 hours:    Intake/Output from previous day: 04/25 0701 - 04/26 0700 In: 720 [P.O.:720] Out: 1212 [Urine:1200; Chest Tube:12] Intake/Output this shift: No intake/output data recorded.  General appearance: alert, cooperative and no distress Heart: regular rate and rhythm Lungs: clear to auscultation bilaterally Abdomen: benign Extremities: no edema Wound: incis healing well  Lab Results: No results for input(s): WBC, HGB, HCT, PLT in the last 72 hours. BMET: No results for input(s): NA, K, CL, CO2, GLUCOSE, BUN, CREATININE, CALCIUM in the last 72 hours.  PT/INR: No results for input(s): LABPROT, INR in the last 72 hours. ABG    Component Value Date/Time   PHART 7.383 03/02/2020 1509   HCO3 24.4 03/02/2020 1509   TCO2 26 03/02/2020 1509   ACIDBASEDEF 1.0 03/02/2020 1509   O2SAT 86.0 03/02/2020 1509   CBG (last 3)  Recent Labs    03/10/20 1659 03/10/20 2051 03/11/20 0632  GLUCAP 103* 97 95    Meds Scheduled Meds: . aspirin EC  325 mg Oral Daily   Or  . aspirin  324 mg Per Tube Daily  . atorvastatin  80 mg Oral q1800  . chlorhexidine  15 mL Mouth Rinse BID  . chlorpheniramine-HYDROcodone  5 mL Oral BID  . [place] [facility]iac Surgery, Patient & Family Education   Does not apply Once  . diltiazem  120 mg Oral  Daily  . enoxaparin (LOVENOX) injection  40 mg Subcutaneous Q24H  . feeding supplement (ENSURE ENLIVE)  237 mL Oral TID WC  . fluticasone  2 spray Each Nare Daily  . insulin aspart  0-15 Units Subcutaneous TID WC  . insulin aspart  0-5 Units Subcutaneous QHS  . insulin detemir  12 Units Subcutaneous Daily  . mouth rinse  15 mL Mouth Rinse q12n4p  . montelukast  10 mg Oral Daily  . pantoprazole  40 mg Oral Daily  . predniSONE  20 mg Oral Q breakfast  . sodium chloride flush  3 mL Intravenous Q12H   Continuous Infusions: . sodium chloride    . lactated ringers Stopped (03/08/20 1234)   PRN Meds:.sodium chloride, ALPRAZolam, alum & mag hydroxide-simeth, hydrALAZINE, ipratropium-albuterol, levalbuterol, magnesium hydroxide, metoprolol tartrate, ondansetron (ZOFRAN) IV, oxyCODONE, sodium chloride flush  Xrays DG CHEST PORT 1 VIEW  Result Date: 03/10/2020 CLINICAL DATA:  Video bronchoscopy. EXAM: PORTABLE CHEST 1 VIEW COMPARISON:  March 09, 2020 FINDINGS: Air in the subcutaneous tissues of the bases of the neck and in the lower lateral chest walls. Mild atelectasis in the right base. No right-sided pneumothorax. Skin fold over the lateral left lung. No convincing evidence of pneumothorax. A left chest tube remains in place. Mild postoperative change in the lower left chest. No change in the cardiomediastinal silhouette. IMPRESSION:  1. Left chest tube. No convincing evidence of pneumothorax. Postoperative change in the left base. Atelectasis in the right base. 2. Subcutaneous air in the chest wall bilaterally and in the bases of the neck bilaterally. Electronically Signed   By: Dorise Bullion III M.D   On: 03/10/2020 13:48    Assessment/Plan: S/P Procedure(s) (LRB): VIDEO BRONCHOSCOPY WITH INSERTION OF INTERBRONCHIAL VALVE (IBV) (N/A)  1 hemodyn stable in sinus rhythm, no fevers 2 sats good on RA 3 CXR is stable,+ air leak 2-3 4 poss change to mini-express and d/c home, air leak may be a  little to large, MD to evaluate   LOS: 12 days    John Giovanni PA-C Pager [phone] 03/11/2020   Chart reviewed, patient examined, agree with above. CXR on water seal looks ok. He has more air leak today than he did over the weekend. 1-3 chambers with breathing and 5+ with cough. This is probably too much for mini express. Will keep to water seal for now with [facility]. Reevaluate in am.  He is very disappointed.

## 2020-03-11 NOTE — Progress Notes (Signed)
CARDIAC REHAB PHASE I    Offered to walk with pt. Pt states he is walking independently without difficulty. Pt maintaining sats on RA with ambulation and with sleep. Still with CT to water seal and small air leak. Pt and wife hopeful for change to mini-express and home later today. Pt with minimal rest in hospital and states it is wearing on him. Pt has experienced pneumothorax's in the past and is well aware of signs and symptoms of when to come for help. Pt with good support system at home. D/c ed completed with pt. Pt referred to [facility] [place]. Hopeful for d/c today.  [id number] Reynold Bowen, RN BSN 03/11/2020 9:51 AM

## 2020-03-11 NOTE — Plan of Care (Signed)
  Problem: Education: Goal: Knowledge of General Education information will improve Description: Including pain rating scale, medication(s)/side effects and non-pharmacologic comfort measures Outcome: Progressing   Problem: Health Behavior/Discharge Planning: Goal: Ability to manage health-related needs will improve Outcome: Progressing   Problem: Clinical Measurements: Goal: Ability to maintain clinical measurements within normal limits will improve Outcome: Progressing Goal: Will remain free from infection Outcome: Progressing Goal: Diagnostic test results will improve Outcome: Progressing Goal: Respiratory complications will improve Outcome: Progressing Goal: Cardiovascular complication will be avoided Outcome: Progressing   Problem: Activity: Goal: Risk for activity intolerance will decrease Outcome: Progressing   Problem: Nutrition: Goal: Adequate nutrition will be maintained Outcome: Progressing   Problem: Coping: Goal: Level of anxiety will decrease Outcome: Progressing   Problem: Elimination: Goal: Will not experience complications related to bowel motility Outcome: Progressing Goal: Will not experience complications related to urinary retention Outcome: Progressing   Problem: Pain Managment: Goal: General experience of comfort will improve Outcome: Progressing   Problem: Skin Integrity: Goal: Risk for impaired skin integrity will decrease Outcome: Progressing   Problem: Education: Goal: Understanding of cardiac disease, CV risk reduction, and recovery process will improve Outcome: Progressing Goal: Individualized Educational Video(s) Outcome: Progressing   Problem: Activity: Goal: Ability to tolerate increased activity will improve Outcome: Progressing   Problem: Health Behavior/Discharge Planning: Goal: Ability to safely manage health-related needs after discharge will improve Outcome: Progressing   Problem: Education: Goal: Understanding of CV  disease, CV risk reduction, and recovery process will improve Outcome: Progressing Goal: Individualized Educational Video(s) Outcome: Progressing   Problem: Activity: Goal: Ability to return to baseline activity level will improve Outcome: Progressing   Problem: Cardiovascular: Goal: Ability to achieve and maintain adequate cardiovascular perfusion will improve Outcome: Progressing Goal: Vascular access site(s) Level 0-1 will be maintained Outcome: Progressing   Problem: Health Behavior/Discharge Planning: Goal: Ability to safely manage health-related needs after discharge will improve Outcome: Progressing   Problem: Education: Goal: Will demonstrate proper wound care and an understanding of methods to prevent future damage Outcome: Progressing Goal: Knowledge of disease or condition will improve Outcome: Progressing Goal: Knowledge of the prescribed therapeutic regimen will improve Outcome: Progressing Goal: Individualized Educational Video(s) Outcome: Progressing   Problem: Activity: Goal: Risk for activity intolerance will decrease Outcome: Progressing   Problem: Cardiac: Goal: Will achieve and/or maintain hemodynamic stability Outcome: Progressing   Problem: Clinical Measurements: Goal: Postoperative complications will be avoided or minimized Outcome: Progressing   Problem: Respiratory: Goal: Respiratory status will improve Outcome: Progressing   Problem: Skin Integrity: Goal: Wound healing without signs and symptoms of infection Outcome: Progressing Goal: Risk for impaired skin integrity will decrease Outcome: Progressing   Problem: Urinary Elimination: Goal: Ability to achieve and maintain adequate renal perfusion and functioning will improve Outcome: Progressing

## 2020-03-12 ENCOUNTER — Inpatient Hospital Stay (HOSPITAL_COMMUNITY): Payer: BC Managed Care – PPO

## 2020-03-12 LAB — GLUCOSE, CAPILLARY
Glucose-Capillary: 107 mg/dL — ABNORMAL HIGH (ref 70–99)
Glucose-Capillary: 107 mg/dL — ABNORMAL HIGH (ref 70–99)
Glucose-Capillary: 118 mg/dL — ABNORMAL HIGH (ref 70–99)
Glucose-Capillary: 129 mg/dL — ABNORMAL HIGH (ref 70–99)

## 2020-03-12 NOTE — Progress Notes (Addendum)
[place]       [employer] [address]             [phone]      4 Days Post-Op Procedure(s) (LRB): VIDEO BRONCHOSCOPY WITH INSERTION OF INTERBRONCHIAL VALVE (IBV) (N/A) Subjective: Feels fine, breathing is comfortable  Objective: Vital signs in last 24 hours: Temp:  [97.6 F (36.4 C)-98.5 F (36.9 C)] 97.6 F (36.4 C) (04/27 0721) Pulse Rate:  [72-116] 89 (04/27 0721) Cardiac Rhythm: Normal sinus rhythm (04/27 0715) Resp:  [12-20] 19 (04/27 0721) BP: (94-121)/(71-87) 112/73 (04/27 0721) SpO2:  [90 %-100 %] 95 % (04/27 0721) Weight:  [86.9 kg] 86.9 kg (04/27 0453)  Hemodynamic parameters for last 24 hours:    Intake/Output from previous day: 04/26 0701 - 04/27 0700 In: 1440 [P.O.:1440] Out: 0  Intake/Output this shift: No intake/output data recorded.  General appearance: alert, cooperative and no distress Heart: regular rate and rhythm Lungs: clear to auscultation bilaterally Abdomen: benign Extremities: no edema or calf tenderness Wound: incis healing well  Lab Results: No results for input(s): WBC, HGB, HCT, PLT in the last 72 hours. BMET: No results for input(s): NA, K, CL, CO2, GLUCOSE, BUN, CREATININE, CALCIUM in the last 72 hours.  PT/INR: No results for input(s): LABPROT, INR in the last 72 hours. ABG    Component Value Date/Time   PHART 7.383 03/02/2020 1509   HCO3 24.4 03/02/2020 1509   TCO2 26 03/02/2020 1509   ACIDBASEDEF 1.0 03/02/2020 1509   O2SAT 86.0 03/02/2020 1509   CBG (last 3)  Recent Labs    03/11/20 1603 03/11/20 2042 03/12/20 0606  GLUCAP 105* 164* 107*    Meds Scheduled Meds: . aspirin EC  325 mg Oral Daily   Or  . aspirin  324 mg Per Tube Daily  . atorvastatin  80 mg Oral q1800  . chlorhexidine  15 mL Mouth Rinse BID  . chlorpheniramine-HYDROcodone  5 mL Oral BID  . [place] [facility]iac Surgery, Patient & Family Education   Does not apply Once  . diltiazem  120 mg Oral Daily  . enoxaparin  (LOVENOX) injection  40 mg Subcutaneous Q24H  . feeding supplement (ENSURE ENLIVE)  237 mL Oral TID WC  . fluticasone  2 spray Each Nare Daily  . insulin aspart  0-15 Units Subcutaneous TID WC  . insulin aspart  0-5 Units Subcutaneous QHS  . insulin detemir  12 Units Subcutaneous Daily  . mouth rinse  15 mL Mouth Rinse q12n4p  . montelukast  10 mg Oral Daily  . pantoprazole  40 mg Oral Daily  . predniSONE  20 mg Oral Q breakfast  . sodium chloride flush  3 mL Intravenous Q12H   Continuous Infusions: . sodium chloride    . lactated ringers Stopped (03/08/20 1234)   PRN Meds:.sodium chloride, ALPRAZolam, alum & mag hydroxide-simeth, hydrALAZINE, ipratropium-albuterol, levalbuterol, magnesium hydroxide, metoprolol tartrate, ondansetron (ZOFRAN) IV, oxyCODONE, sodium chloride flush  Xrays DG CHEST PORT 1 VIEW  Result Date: 03/11/2020 CLINICAL DATA:  Post CABG, pneumothorax EXAM: PORTABLE CHEST 1 VIEW COMPARISON:  03/10/2020 FINDINGS: Left chest tube remains present. No pneumothorax identified. Residual subcutaneous emphysema. Probable patchy bibasilar atelectasis. No pleural effusion. Stable cardiomediastinal contours. IMPRESSION: Left chest tube. No pneumothorax identified. Bibasilar atelectasis. Electronically Signed   By: Macy Mis M.D.   On: 03/11/2020 08:20   DG CHEST PORT 1 VIEW  Result Date: 03/10/2020 CLINICAL DATA:  Video bronchoscopy. EXAM: PORTABLE CHEST 1 VIEW COMPARISON:  March 09, 2020 FINDINGS: Air in the subcutaneous tissues of the bases of the neck and in the lower lateral chest walls. Mild atelectasis in the right base. No right-sided pneumothorax. Skin fold over the lateral left lung. No convincing evidence of pneumothorax. A left chest tube remains in place. Mild postoperative change in the lower left chest. No change in the cardiomediastinal silhouette. IMPRESSION: 1. Left chest tube. No convincing evidence of pneumothorax. Postoperative change in the left base.  Atelectasis in the right base. 2. Subcutaneous air in the chest wall bilaterally and in the bases of the neck bilaterally. Electronically Signed   By: Gerome Sam III M.D   On: 03/10/2020 13:48    Assessment/Plan: S/P Procedure(s) (LRB): VIDEO BRONCHOSCOPY WITH INSERTION OF INTERBRONCHIAL VALVE (IBV) (N/A)  1 remains hemodyn stable in sinus rhythm/tach, afebrile 2 sats are good on RA 3 + large air leak persists, some changes on CXR in left base, not certain if pntx. Await radiologist reading- may need to place back to suction 4 no new labs 5 probably not ready for mini-express, hoping to avoid additional procedures if possible  LOS: 13 days    Rowe Clack PA-C Pager [phone] 03/12/2020    Chart reviewed, patient examined, agree with above. I think his air leak this evening looks less, mostly 1-2+ and a little more with cough. CXR looks ok with ? Tiny left apical ptx. If CXR looks the same tomorrow I think we can try Mini-express for a day or two in the hospital and hopefully get him home if lung remains expanded. He and wife are in agreement.

## 2020-03-12 NOTE — Progress Notes (Signed)
CARDIAC REHAB PHASE I   Offered to walk with pt. Pt states he and his wife have walked twice today. Using IS regularly. Pt disappointed he is still here but has a good support system to keep him laughing and going. Provided support and encouragement. Will continue to follow.  [id number] Sean Bowen, RN BSN 03/12/2020 11:04 AM

## 2020-03-12 NOTE — Plan of Care (Signed)

## 2020-03-13 ENCOUNTER — Inpatient Hospital Stay (HOSPITAL_COMMUNITY): Payer: BC Managed Care – PPO

## 2020-03-13 LAB — GLUCOSE, CAPILLARY
Glucose-Capillary: 92 mg/dL (ref 70–99)
Glucose-Capillary: 129 mg/dL — ABNORMAL HIGH (ref 70–99)
Glucose-Capillary: 98 mg/dL (ref 70–99)
Glucose-Capillary: 120 mg/dL — ABNORMAL HIGH (ref 70–99)

## 2020-03-13 NOTE — Plan of Care (Signed)
  Problem: Education: Goal: Knowledge of General Education information will improve Description: Including pain rating scale, medication(s)/side effects and non-pharmacologic comfort measures Outcome: Progressing   Problem: Education: Goal: Knowledge of General Education information will improve Description: Including pain rating scale, medication(s)/side effects and non-pharmacologic comfort measures Outcome: Progressing   Problem: Health Behavior/Discharge Planning: Goal: Ability to manage health-related needs will improve Outcome: Progressing   Problem: Clinical Measurements: Goal: Ability to maintain clinical measurements within normal limits will improve Outcome: Progressing Goal: Will remain free from infection Outcome: Progressing Goal: Diagnostic test results will improve Outcome: Progressing Goal: Respiratory complications will improve Outcome: Progressing Goal: Cardiovascular complication will be avoided Outcome: Progressing   Problem: Activity: Goal: Risk for activity intolerance will decrease Outcome: Progressing   Problem: Nutrition: Goal: Adequate nutrition will be maintained Outcome: Progressing   Problem: Coping: Goal: Level of anxiety will decrease Outcome: Progressing   Problem: Elimination: Goal: Will not experience complications related to bowel motility Outcome: Progressing Goal: Will not experience complications related to urinary retention Outcome: Progressing   Problem: Pain Managment: Goal: General experience of comfort will improve Outcome: Progressing   Problem: Safety: Goal: Ability to remain free from injury will improve Outcome: Progressing   Problem: Skin Integrity: Goal: Risk for impaired skin integrity will decrease Outcome: Progressing   Problem: Education: Goal: Understanding of cardiac disease, CV risk reduction, and recovery process will improve Outcome: Progressing Goal: Individualized Educational Video(s) Outcome:  Progressing   Problem: Activity: Goal: Ability to tolerate increased activity will improve Outcome: Progressing   Problem: Cardiac: Goal: Ability to achieve and maintain adequate cardiovascular perfusion will improve Outcome: Progressing   Problem: Health Behavior/Discharge Planning: Goal: Ability to safely manage health-related needs after discharge will improve Outcome: Progressing   Problem: Education: Goal: Understanding of CV disease, CV risk reduction, and recovery process will improve Outcome: Progressing Goal: Individualized Educational Video(s) Outcome: Progressing   Problem: Activity: Goal: Ability to return to baseline activity level will improve Outcome: Progressing   Problem: Cardiovascular: Goal: Ability to achieve and maintain adequate cardiovascular perfusion will improve Outcome: Progressing Goal: Vascular access site(s) Level 0-1 will be maintained Outcome: Progressing   Problem: Health Behavior/Discharge Planning: Goal: Ability to safely manage health-related needs after discharge will improve Outcome: Progressing   Problem: Education: Goal: Will demonstrate proper wound care and an understanding of methods to prevent future damage Outcome: Progressing Goal: Knowledge of disease or condition will improve Outcome: Progressing Goal: Knowledge of the prescribed therapeutic regimen will improve Outcome: Progressing Goal: Individualized Educational Video(s) Outcome: Progressing   Problem: Activity: Goal: Risk for activity intolerance will decrease Outcome: Progressing   Problem: Cardiac: Goal: Will achieve and/or maintain hemodynamic stability Outcome: Progressing   Problem: Clinical Measurements: Goal: Postoperative complications will be avoided or minimized Outcome: Progressing   Problem: Respiratory: Goal: Respiratory status will improve Outcome: Progressing   Problem: Skin Integrity: Goal: Wound healing without signs and symptoms of  infection Outcome: Progressing Goal: Risk for impaired skin integrity will decrease Outcome: Progressing   Problem: Urinary Elimination: Goal: Ability to achieve and maintain adequate renal perfusion and functioning will improve Outcome: Progressing

## 2020-03-13 NOTE — Progress Notes (Signed)
CARDIAC REHAB PHASE I   Pt continues to ambulate independently in hallway, maintaining O2 sats. Pt got switched to mini-express today and is excited about the step forward. Pt beginning to process the events leading up to and during hospital stay. Provided support and encouragement. Pt anxious to get home to grand kids. Pt and wife deny any needs or concerns. Will continue to follow.  [id number] Reynold Bowen, RN BSN 03/13/2020 11:35 AM

## 2020-03-13 NOTE — Progress Notes (Signed)
[address]       [place] [address]             [phone]      5 Days Post-Op Procedure(s) (LRB): VIDEO BRONCHOSCOPY WITH INSERTION OF INTERBRONCHIAL VALVE (IBV) (N/A) Subjective: Feels well  Objective: Vital signs in last 24 hours: Temp:  [97.6 F (36.4 C)-99.2 F (37.3 C)] 98.1 F (36.7 C) (04/28 0409) Pulse Rate:  [78-89] 78 (04/28 0300) Cardiac Rhythm: Normal sinus rhythm (04/27 2300) Resp:  [12-19] 12 (04/28 0300) BP: (112-124)/(73-78) 114/75 (04/28 0409) SpO2:  [93 %-95 %] 94 % (04/28 0300) Weight:  [86.5 kg] 86.5 kg (04/28 0409)  Hemodynamic parameters for last 24 hours:    Intake/Output from previous day: 04/27 0701 - 04/28 0700 In: 960 [P.O.:960] Out: 2 [Chest Tube:2] Intake/Output this shift: No intake/output data recorded.  General appearance: alert, cooperative and no distress Heart: regular rate and rhythm Lungs: clear to auscultation bilaterally Abdomen: benign Extremities: no edema Wound: incis healing well  Lab Results: No results for input(s): WBC, HGB, HCT, PLT in the last 72 hours. BMET: No results for input(s): NA, K, CL, CO2, GLUCOSE, BUN, CREATININE, CALCIUM in the last 72 hours.  PT/INR: No results for input(s): LABPROT, INR in the last 72 hours. ABG    Component Value Date/Time   PHART 7.383 03/02/2020 1509   HCO3 24.4 03/02/2020 1509   TCO2 26 03/02/2020 1509   ACIDBASEDEF 1.0 03/02/2020 1509   O2SAT 86.0 03/02/2020 1509   CBG (last 3)  Recent Labs    03/12/20 1630 03/12/20 2103 03/13/20 0623  GLUCAP 118* 129* 92    Meds Scheduled Meds: . aspirin EC  325 mg Oral Daily   Or  . aspirin  324 mg Per Tube Daily  . atorvastatin  80 mg Oral q1800  . chlorhexidine  15 mL Mouth Rinse BID  . chlorpheniramine-HYDROcodone  5 mL Oral BID  . [place] Cardiac Surgery, Patient & Family Education   Does not apply Once  . diltiazem  120 mg Oral Daily  . enoxaparin (LOVENOX) injection  40 mg Subcutaneous  Q24H  . feeding supplement (ENSURE ENLIVE)  237 mL Oral TID WC  . fluticasone  2 spray Each Nare Daily  . insulin aspart  0-15 Units Subcutaneous TID WC  . insulin aspart  0-5 Units Subcutaneous QHS  . insulin detemir  12 Units Subcutaneous Daily  . mouth rinse  15 mL Mouth Rinse q12n4p  . montelukast  10 mg Oral Daily  . pantoprazole  40 mg Oral Daily  . predniSONE  20 mg Oral Q breakfast  . sodium chloride flush  3 mL Intravenous Q12H   Continuous Infusions: . sodium chloride    . lactated ringers Stopped (03/08/20 1234)   PRN Meds:.sodium chloride, ALPRAZolam, alum & mag hydroxide-simeth, hydrALAZINE, ipratropium-albuterol, levalbuterol, magnesium hydroxide, metoprolol tartrate, ondansetron (ZOFRAN) IV, oxyCODONE, sodium chloride flush  Xrays DG Chest 2 View  Result Date: 03/12/2020 CLINICAL DATA:  Chest tube EXAM: CHEST - 2 VIEW COMPARISON:  03/11/2020 FINDINGS: Prior CABG. Left chest tube remains in place, unchanged. Tiny left apical pneumothorax. Air-fluid level is seen within the right lung base, likely loculated hydropneumothorax. Heart is normal size. Subcutaneous emphysema noted at the neck base and in the right chest wall. No acute bony abnormality. IMPRESSION: Tiny left apical pneumothorax. Probable loculated hydropneumothorax on the right. Electronically Signed   By: Rolm Baptise M.D.   On: 03/12/2020 09:10  Assessment/Plan: S/P Procedure(s) (LRB): VIDEO BRONCHOSCOPY WITH INSERTION OF INTERBRONCHIAL VALVE (IBV) (N/A)  1 conts to be very stable from cardiac perspective 2 stable vitals and afebrile 3 CXR is stable 4 2-3 air leak this am 5 will place to mini-express and observe in hospital, repeat CXR in am    LOS: 14 days    Rowe Clack PA-C Pager [phone] 03/13/2020

## 2020-03-14 ENCOUNTER — Inpatient Hospital Stay (HOSPITAL_COMMUNITY): Payer: BC Managed Care – PPO

## 2020-03-14 LAB — GLUCOSE, CAPILLARY: Glucose-Capillary: 99 mg/dL (ref 70–99)

## 2020-03-14 MED ORDER — TRAMADOL HCL 50 MG PO TABS: 50.0000 mg | ORAL_TABLET | Freq: Four times a day (QID) | ORAL | 0 refills | Status: AC | PRN

## 2020-03-14 MED ORDER — PANTOPRAZOLE SODIUM 40 MG PO TBEC: 40.0000 mg | DELAYED_RELEASE_TABLET | Freq: Every day | ORAL | 2 refills | Status: DC

## 2020-03-14 MED ORDER — DILTIAZEM HCL ER COATED BEADS 120 MG PO CP24: 120.0000 mg | ORAL_CAPSULE | Freq: Every day | ORAL | 2 refills | Status: DC

## 2020-03-14 MED ORDER — ATORVASTATIN CALCIUM 80 MG PO TABS: 80.0000 mg | ORAL_TABLET | Freq: Every day | ORAL | 2 refills | Status: DC

## 2020-03-14 MED ORDER — ASPIRIN 325 MG PO TBEC: 325.0000 mg | DELAYED_RELEASE_TABLET | Freq: Every day | ORAL | Status: DC

## 2020-03-14 MED ORDER — PREDNISONE 20 MG PO TABS: 20.0000 mg | ORAL_TABLET | Freq: Every day | ORAL | 0 refills | Status: DC

## 2020-03-14 NOTE — Discharge Instructions (Signed)
1. Please have patient watch video on Youtube:[facility] mini express. [facility] patient education  2. General Patient Instructions:  If the tube becomes disconnected, reconnect it immediately and tape it securely. To reconnect the tube: Pinch the chest tube with your fingers to close the chest tube until you can re attach it. If you are able, clean the ends of the chest tube and drain with an alcohol swab before reattaching.   If the tube becomes disconnected AND you cannot put it back together, go to closest Emergency Department  Please do NOT allow the mini express chamber to become full. To empty the fluid from the chamber: First, wash your hands with soap and water. Use a Luer lock syringe (provided by hospital or home health, if arranged) to attach to the front port (twist Luer lock syringe clock wise to to tighten) at the bottom of the mini express. Pull the syringe plunger back, unscrew the syringe and put fluid into the toilet. You may repeat as necessary. If it becomes difficult to empty the fluid in the mini express, squirt water through the port (where syringe attaches to) to flush out the blockage. If this does not work, call the office .  Try to keep mini express upright and below the level of your heart. Also, check periodically that there are no kinks in the tubing.   Changing the chest tube dressing: Please change the dressing at least every other day or if it becomes wet. You will be taught how to change the dressing before you are discharged from the hospital or home health will be arranged to do it for you.   If the chest falls out or gets pulled out: Place a piece of gauze over the site and cover the gauze with tape. Contact our office ASAP [phone]) If you have chest pain or sudden onset of shortness of breath, go to the nearest emergency room   [facility]- [phone]   Endoscopic Saphenous Vein Harvesting, Care After This sheet gives you  information about how to care for yourself after your procedure. Your health care provider may also give you more specific instructions. If you have problems or questions, contact your health care provider. What can I expect after the procedure? After the procedure, it is common to have:  Pain.  Bruising.  Swelling.  Numbness. Follow these instructions at home: Incision care   Follow instructions from your health care provider about how to take care of your incisions. Make sure you: ? Wash your hands with soap and water before and after you change your bandages (dressings). If soap and water are not available, use hand sanitizer. ? Change your dressings as told by your health care provider. ? Leave stitches (sutures), skin glue, or adhesive strips in place. These skin closures may need to stay in place for 2 weeks or longer. If adhesive strip edges start to loosen and curl up, you may trim the loose edges. Do not remove adhesive strips completely unless your health care provider tells you to do that.  Check your incision areas every day for signs of infection. Check for: ? More redness, swelling, or pain. ? Fluid or blood. ? Warmth. ? Pus or a bad smell. Medicines  Take over-the-counter and prescription medicines only as told by your health care provider.  Ask your health care provider if the medicine prescribed to you requires you to avoid driving or using heavy machinery. General instructions  Raise (elevate) your  legs above the level of your heart while you are sitting or lying down.  Avoid crossing your legs.  Avoid sitting for long periods of time. Change positions every 30 minutes.  Do any exercises your health care providers have given you. These may include deep breathing, coughing, and walking exercises.  Do not take baths, swim, or use a hot tub until your health care provider approves. Ask your health care provider if you may take showers. You may only be allowed to  take sponge baths.  Wear compression stockings as told by your health care provider. These stockings help to prevent blood clots and reduce swelling in your legs.  Keep all follow-up visits as told by your health care provider. This is important. Contact a health care provider if:  Medicine does not help your pain.  Your pain gets worse.  You have new leg bruises or your leg bruises get bigger.  Your leg feels numb.  You have more redness, swelling, or pain around your incision.  You have fluid or blood coming from your incision.  Your incision feels warm to the touch.  You have pus or a bad smell coming from your incision.  You have a fever. Get help right away if:  Your pain is severe.  You develop pain, tenderness, warmth, redness, or swelling in any part of your leg.  You have chest pain.  You have trouble breathing. Summary  Raise (elevate) your legs above the level of your heart while you are sitting or lying down.  Wear compression stockings as told by your health care provider.  Make sure you know which symptoms should prompt you to contact your health care provider.  Keep all follow-up visits as told by your health care provider. This information is not intended to replace advice given to you by your health care provider. Make sure you discuss any questions you have with your health care provider. Document Revised: 10/10/2018 Document Reviewed: 10/10/2018 Elsevier Patient Education  2020 [employer] Coronary Artery Bypass Grafting, Care After This sheet gives you information about how to care for yourself after your procedure. Your doctor may also give you more specific instructions. If you have problems or questions, call your doctor. What can I expect after the procedure? After the procedure, it is common to:  Feel sick to your stomach (nauseous).  Not want to eat as much as normal (lack of appetite).  Have trouble pooping (constipation).  Have  weakness and tiredness (fatigue).  Feel sad (depressed) or grouchy (irritable).  Have pain or discomfort around the cuts from surgery (incisions). Follow these instructions at home: Medicines  Take over-the-counter and prescription medicines only as told by your doctor. Do not stop taking medicines or start any new medicines unless your doctor says it is okay.  If you were prescribed an antibiotic medicine, take it as told by your doctor. Do not stop taking the antibiotic even if you start to feel better. Incision care   Follow instructions from your doctor about how to take care of your cuts from surgery. Make sure you: ? Wash your hands with soap and water before and after you change your bandage (dressing). If you cannot use soap and water, use hand sanitizer. ? Change your bandage as told by your doctor. ? Leave stitches (sutures), skin glue, or skin tape (adhesive) strips in place. They may need to stay in place for 2 weeks or longer. If tape strips get loose and curl up, you may  trim the loose edges. Do not remove tape strips completely unless your doctor says it is okay.  Make sure the surgery cuts are clean, dry, and protected.  Check your cut areas every day for signs of infection. Check for: ? More redness, swelling, or pain. ? More fluid or blood. ? Warmth. ? Pus or a bad smell.  If cuts were made in your legs: ? Avoid crossing your legs. ? Avoid sitting for long periods of time. Change positions every 30 minutes. ? Raise (elevate) your legs when you are sitting. Bathing  Do not take baths, swim, or use a hot tub until your doctor says it is okay.  You may take a shower. Pat the surgery cuts dry. Do not rub the cuts to dry.  Eating and drinking   Eat foods that are high in fiber, such as beans, nuts, whole grains, and raw fruits and vegetables. Any meats you eat should be lean cut. Avoid canned, processed, and fried foods. This can help prevent trouble pooping. This  is also a part of a heart-healthy diet.  Drink enough fluid to keep your pee (urine) pale yellow.  Do not drink alcohol until you are fully recovered. Ask your doctor when it is safe to drink alcohol. Activity  Rest and limit your activity as told by your doctor. You may be told to: ? Stop any activity right away if you have chest pain, shortness of breath, irregular heartbeats, or dizziness. Get help right away if you have any of these symptoms. ? Move around often for short periods or take short walks as told by your doctor. Slowly increase your activities. ? Avoid lifting, pushing, or pulling anything that is heavier than 10 lb (4.5 kg) for at least 6 weeks or as told by your doctor.  Do physical therapy or a cardiac rehab (cardiac rehabilitation) program as told by your doctor. ? Physical therapy involves doing exercises to maintain movement and build strength and endurance. ? A cardiac rehab program includes:  Exercise training.  Education.  Counseling.  Do not drive until your doctor says it is okay.  Ask your doctor when you can go back to work.  Ask your doctor when you can be sexually active. General instructions  Do not drive or use heavy machinery while taking prescription pain medicine.  Do not use any products that contain nicotine or tobacco. These include cigarettes, e-cigarettes, and chewing tobacco. If you need help quitting, ask your doctor.  Take 2-3 deep breaths every few hours during the day while you get better. This helps expand your lungs and prevent problems.  If you were given a device called an incentive spirometer, use it several times a day to practice deep breathing. Support your chest with a pillow or your arms when you take deep breaths or cough.  Wear compression stockings as told by your doctor.  Weigh yourself every day. This helps to see if your body is holding (retaining) fluid that may make your heart and lungs work harder.  Keep all  follow-up visits as told by your doctor. This is important. Contact a doctor if:  You have more redness, swelling, or pain around any cut.  You have more fluid or blood coming from any cut.  Any cut feels warm to the touch.  You have pus or a bad smell coming from any cut.  You have a fever.  You have swelling in your ankles or legs.  You have pain in your legs.  You gain 2 lb (0.9 kg) or more a day.  You feel sick to your stomach or you throw up (vomit).  You have watery poop (diarrhea). Get help right away if:  You have chest pain that goes to your jaw or arms.  You are short of breath.  You have a fast or irregular heartbeat.  You notice a "clicking" in your breastbone (sternum) when you move.  You have any signs of a stroke. "BE FAST" is an easy way to remember the main warning signs: ? B - Balance. Signs are dizziness, sudden trouble walking, or loss of balance. ? E - Eyes. Signs are trouble seeing or a change in how you see. ? F - Face. Signs are sudden weakness or loss of feeling of the face, or the face or eyelid drooping on one side. ? A - Arms. Signs are weakness or loss of feeling in an arm. This happens suddenly and usually on one side of the body. ? S - Speech. Signs are sudden trouble speaking, slurred speech, or trouble understanding what people say. ? T - Time. Time to call emergency services. Write down what time symptoms started.  You have other signs of a stroke, such as: ? A sudden, very bad headache with no known cause. ? Feeling sick to your stomach. ? Throwing up. ? Jerky movements you cannot control (seizure). These symptoms may be an emergency. Do not wait to see if the symptoms will go away. Get medical help right away. Call your local emergency services (911 in the [place] Do not drive yourself to the hospital. Summary  After the procedure, it is common to have pain or discomfort in the cuts from surgery (incisions).  Do not take baths,  swim, or use a hot tub until your doctor says it is okay.  Slowly increase your activities. You may need physical therapy or cardiac rehab.  Weigh yourself every day. This helps to see if your body is holding fluid. This information is not intended to replace advice given to you by your health care provider. Make sure you discuss any questions you have with your health care provider. Document Revised: 07/12/2018 Document Reviewed: 07/12/2018 Elsevier Patient Education  2020 [employer].

## 2020-03-14 NOTE — Progress Notes (Addendum)
[address]       [place] [id number]             [phone]      6 Days Post-Op Procedure(s) (LRB): VIDEO BRONCHOSCOPY WITH INSERTION OF INTERBRONCHIAL VALVE (IBV) (N/A) Subjective: Looks and feels well, not SOB   Objective: Vital signs in last 24 hours: Temp:  [97.5 F (36.4 C)-98.5 F (36.9 C)] 97.7 F (36.5 C) (04/29 0424) Pulse Rate:  [61-99] 61 (04/29 0424) Cardiac Rhythm: Normal sinus rhythm (04/29 0424) Resp:  [15-20] 18 (04/29 0424) BP: (104-124)/(77-90) 105/85 (04/29 0424) SpO2:  [90 %-100 %] 96 % (04/29 0424) Weight:  [85.7 kg] 85.7 kg (04/29 0424)  Hemodynamic parameters for last 24 hours:    Intake/Output from previous day: 04/28 0701 - 04/29 0700 In: 243 [P.O.:240; I.V.:3] Out: 30 [Chest Tube:30] Intake/Output this shift: No intake/output data recorded.  General appearance: alert, cooperative and no distress Heart: regular rate and rhythm Lungs: clear to auscultation bilaterally Abdomen: benign Extremities: no edema Wound: incis healing well  Lab Results: No results for input(s): WBC, HGB, HCT, PLT in the last 72 hours. BMET: No results for input(s): NA, K, CL, CO2, GLUCOSE, BUN, CREATININE, CALCIUM in the last 72 hours.  PT/INR: No results for input(s): LABPROT, INR in the last 72 hours. ABG    Component Value Date/Time   PHART 7.383 03/02/2020 1509   HCO3 24.4 03/02/2020 1509   TCO2 26 03/02/2020 1509   ACIDBASEDEF 1.0 03/02/2020 1509   O2SAT 86.0 03/02/2020 1509   CBG (last 3)  Recent Labs    03/13/20 1554 03/13/20 2148 03/14/20 0603  GLUCAP 98 120* 99    Meds Scheduled Meds: . aspirin EC  325 mg Oral Daily   Or  . aspirin  324 mg Per Tube Daily  . atorvastatin  80 mg Oral q1800  . chlorhexidine  15 mL Mouth Rinse BID  . chlorpheniramine-HYDROcodone  5 mL Oral BID  . [facility] Patient [facility] Family Education   Does not apply Once  . diltiazem  120 mg Oral Daily  . enoxaparin (LOVENOX)  injection  40 mg Subcutaneous Q24H  . feeding supplement (ENSURE ENLIVE)  237 mL Oral TID WC  . fluticasone  2 spray Each Nare Daily  . insulin aspart  0-15 Units Subcutaneous TID WC  . insulin aspart  0-5 Units Subcutaneous QHS  . insulin detemir  12 Units Subcutaneous Daily  . mouth rinse  15 mL Mouth Rinse q12n4p  . montelukast  10 mg Oral Daily  . pantoprazole  40 mg Oral Daily  . predniSONE  20 mg Oral Q breakfast  . sodium chloride flush  3 mL Intravenous Q12H   Continuous Infusions: . sodium chloride    . lactated ringers Stopped (03/08/20 1234)   PRN Meds:.sodium chloride, ALPRAZolam, alum & mag hydroxide-simeth, hydrALAZINE, ipratropium-albuterol, levalbuterol, magnesium hydroxide, metoprolol tartrate, ondansetron (ZOFRAN) IV, oxyCODONE, sodium chloride flush  Xrays DG CHEST PORT 1 VIEW  Result Date: 03/13/2020 CLINICAL DATA:  Chest tube, pneumothorax EXAM: PORTABLE CHEST 1 VIEW COMPARISON:  03/12/2020 FINDINGS: Left chest tube remains in place. Tiny left apical pneumothorax is again noted, stable. Air-fluid level in the right lower chest is also again seen, unchanged. Bilateral lower lobe opacities. Heart is normal size. Changes of CABG. IMPRESSION: Stable tiny left apical pneumothorax and probable loculated hydropneumothorax on the right. Electronically Signed   By: Rolm Baptise M.D.   On: 03/13/2020 09:47  Assessment/Plan:   1 conts to do  well 2 afeb, VSS 3 sats good on RA 4 pain well controlled 5 CXR is stable 6 small air leak- cont mini-express 7 stable for d/c home   LOS: 15 days    Rowe Clack PA-C  Pager [phone] 03/14/2020   Chart reviewed, patient examined, agree with above. CXR looks good. Plan home with mini express and followup next week with [facility] in office with CXR.

## 2020-03-14 NOTE — Plan of Care (Signed)
  Problem: Education: Goal: Knowledge of General Education information will improve Description: Including pain rating scale, medication(s)/side effects and non-pharmacologic comfort measures Outcome: Adequate for Discharge   Problem: Health Behavior/Discharge Planning: Goal: Ability to manage health-related needs will improve Outcome: Adequate for Discharge   Problem: Clinical Measurements: Goal: Ability to maintain clinical measurements within normal limits will improve Outcome: Adequate for Discharge Goal: Will remain free from infection Outcome: Adequate for Discharge Goal: Diagnostic test results will improve Outcome: Adequate for Discharge Goal: Respiratory complications will improve Outcome: Adequate for Discharge Goal: Cardiovascular complication will be avoided Outcome: Adequate for Discharge   Problem: Activity: Goal: Risk for activity intolerance will decrease Outcome: Adequate for Discharge   Problem: Nutrition: Goal: Adequate nutrition will be maintained Outcome: Adequate for Discharge   Problem: Coping: Goal: Level of anxiety will decrease Outcome: Adequate for Discharge   Problem: Elimination: Goal: Will not experience complications related to bowel motility Outcome: Adequate for Discharge Goal: Will not experience complications related to urinary retention Outcome: Adequate for Discharge   Problem: Pain Managment: Goal: General experience of comfort will improve Outcome: Adequate for Discharge   Problem: Safety: Goal: Ability to remain free from injury will improve Outcome: Adequate for Discharge   Problem: Skin Integrity: Goal: Risk for impaired skin integrity will decrease Outcome: Adequate for Discharge   Problem: Education: Goal: Understanding of cardiac disease, CV risk reduction, and recovery process will improve Outcome: Adequate for Discharge Goal: Individualized Educational Video(s) Outcome: Adequate for Discharge   Problem:  Activity: Goal: Ability to tolerate increased activity will improve Outcome: Adequate for Discharge   Problem: Cardiac: Goal: Ability to achieve and maintain adequate cardiovascular perfusion will improve Outcome: Adequate for Discharge   Problem: Health Behavior/Discharge Planning: Goal: Ability to safely manage health-related needs after discharge will improve Outcome: Adequate for Discharge   Problem: Education: Goal: Understanding of CV disease, CV risk reduction, and recovery process will improve Outcome: Adequate for Discharge Goal: Individualized Educational Video(s) Outcome: Adequate for Discharge   Problem: Activity: Goal: Ability to return to baseline activity level will improve Outcome: Adequate for Discharge   Problem: Cardiovascular: Goal: Ability to achieve and maintain adequate cardiovascular perfusion will improve Outcome: Adequate for Discharge Goal: Vascular access site(s) Level 0-1 will be maintained Outcome: Adequate for Discharge   Problem: Health Behavior/Discharge Planning: Goal: Ability to safely manage health-related needs after discharge will improve Outcome: Adequate for Discharge   Problem: Education: Goal: Will demonstrate proper wound care and an understanding of methods to prevent future damage Outcome: Adequate for Discharge Goal: Knowledge of disease or condition will improve Outcome: Adequate for Discharge Goal: Knowledge of the prescribed therapeutic regimen will improve Outcome: Adequate for Discharge Goal: Individualized Educational Video(s) Outcome: Adequate for Discharge   Problem: Activity: Goal: Risk for activity intolerance will decrease Outcome: Adequate for Discharge   Problem: Clinical Measurements: Goal: Postoperative complications will be avoided or minimized Outcome: Adequate for Discharge   Problem: Respiratory: Goal: Respiratory status will improve Outcome: Adequate for Discharge   Problem: Skin Integrity: Goal:  Wound healing without signs and symptoms of infection Outcome: Adequate for Discharge Goal: Risk for impaired skin integrity will decrease Outcome: Adequate for Discharge   Problem: Urinary Elimination: Goal: Ability to achieve and maintain adequate renal perfusion and functioning will improve Outcome: Adequate for Discharge

## 2020-03-14 NOTE — Progress Notes (Signed)
[facility] will sign off.   Medication Recommendations:  Since he presented with acute coronary syndrome, consider clopidogrel 75 mg daily for 1 year (unless there is concern for bleeding) Other recommendations (labs, testing, etc):  n/a Follow up as an outpatient:  May 06, Dr. Margarette Canada, MD, [facility] [phone] office [phone] pager

## 2020-03-14 NOTE — Progress Notes (Signed)
Pt refused tussionex this AM, forgot to return to [facility], returned med to Pharmacy this afternoon. Reviewed above with charge RN [facility] and Pharmacy.

## 2020-03-19 NOTE — Progress Notes (Signed)
Cardiology Office Note:   Date:  03/21/2020  NAME:  Sean Joseph    MRN: [id number] DOB:  11/17/1965   PCP:  Blane Ohara, MD  Cardiologist:  No primary care provider on file.   Referring MD: Blane Ohara, MD   Chief Complaint  Patient presents with  . Coronary Artery Disease   History of Present Illness:   Sean Joseph is a 54 y.o. male with a hx of CAD status post bypass, hyperlipidemia, prediabetes, COPD/tobacco abuse who presents for follow-up of CAD.  Recently admitted for non-STEMI in April.  He underwent three-vessel CABG with Dr. Dorris Fetch.  He had a complicated course with persistent air leak in his left chest and had to undergo left chest tube placement.  He does have that in place today.  He will follow with Dr. Dorris Fetch in 2 weeks for management of this.  He reports has been doing well since heart surgery.  He is not driving.  He is walking and doing exercises up to 15 to 20 minutes/day without any limitations such as chest pain or shortness of breath.  He does report some achiness in his chest which is likely related to the chest tube from what he reports.  He also reports that sometimes his heart can race but this mainly occurs when he is in pain from the chest tube.  He remains on aspirin 325 for unclear reasons.  He is also on diltiazem and not on a beta-blocker.  He remains on Lipitor and needs a repeat lipid profile.  He denies chest pain, shortness of breath or palpitations today.  His EKG today demonstrates sinus tachycardia without any acute ischemic changes.  He reports at home his heart rates have been in the 80s.  He does have plans to pursue cardiac rehab at our [place] office.  He also would like to transfer his care to the [place] clinic as he lives there.  Problem List 1. NSTEMI 02/29/2020 -3v CAD -3v CABG (LIMA-LAD, free RIMA-RCA, SVG-OM1 03/01/2020, Charlett Lango, MD) 2. HLD -T chol 222, HDL 32, LDL 144, TG 231 3. Pre-DM -A1c 6.0 4. COPD 5.  Tobacco abuse   Past Medical History: Past Medical History:  Diagnosis Date  . GERD (gastroesophageal reflux disease)     Past Surgical History: Past Surgical History:  Procedure Laterality Date  . CORONARY ARTERY BYPASS GRAFT N/A 03/01/2020   Procedure: CORONARY ARTERY BYPASS GRAFTING (CABG) X 3, USING BILATERAL MAMMORY ARTERIES, RIGHT LEG GREATER SAPHENOUS VEIN HARVESTED.;  Surgeon: Loreli Slot, MD;  Location: [facility] OR;  Service: Open Heart Surgery;  Laterality: N/A;  . HERNIA REPAIR    . LEFT HEART CATH AND CORONARY ANGIOGRAPHY N/A 02/29/2020   Procedure: LEFT HEART CATH AND CORONARY ANGIOGRAPHY;  Surgeon: Lyn Records, MD;  Location: [facility] INVASIVE CV LAB;  Service: Cardiovascular;  Laterality: N/A;  . TEE WITHOUT CARDIOVERSION N/A 03/01/2020   Procedure: TRANSESOPHAGEAL ECHOCARDIOGRAM (TEE);  Surgeon: Loreli Slot, MD;  Location: [facility] OR;  Service: Open Heart Surgery;  Laterality: N/A;  . VIDEO BRONCHOSCOPY WITH INSERTION OF INTERBRONCHIAL VALVE (IBV) N/A 03/08/2020   Procedure: VIDEO BRONCHOSCOPY WITH INSERTION OF INTERBRONCHIAL VALVE (IBV);  Surgeon: Loreli Slot, MD;  Location: [facility] OR;  Service: Thoracic;  Laterality: N/A;    Current Medications: Current Meds  Medication Sig  . albuterol (VENTOLIN HFA) 108 (90 Base) MCG/ACT inhaler Inhale 1-2 puffs into the lungs every 4 (four) hours as needed for wheezing or shortness of breath.  Marland Kitchen aspirin 81  MG EC tablet Take 1 tablet (81 mg total) by mouth daily.  Marland Kitchen atorvastatin (LIPITOR) 80 MG tablet Take 1 tablet (80 mg total) by mouth daily at 6 PM.  . diltiazem (CARDIZEM CD) 120 MG 24 hr capsule Take 1 capsule (120 mg total) by mouth daily.  . fexofenadine-pseudoephedrine (ALLEGRA-D 24) 180-240 MG 24 hr tablet Take 1 tablet by mouth daily.  . montelukast (SINGULAIR) 10 MG tablet Take 1 tablet (10 mg total) by mouth at bedtime. (Patient taking differently: Take 10 mg by mouth daily. )  . pantoprazole (PROTONIX) 40 MG  tablet Take 1 tablet (40 mg total) by mouth daily.  . traMADol (ULTRAM) 50 MG tablet Take 1 tablet (50 mg total) by mouth every 6 (six) hours as needed for up to 7 days.  . [DISCONTINUED] aspirin EC 325 MG EC tablet Take 1 tablet (325 mg total) by mouth daily.     Allergies:    Codeine   Social History: Social History   Socioeconomic History  . Marital status: Married    Spouse name: Not on file  . Number of children: Not on file  . Years of education: Not on file  . Highest education level: Not on file  Occupational History  . Not on file  Tobacco Use  . Smoking status: Former Games developer  . Smokeless tobacco: Never Used  Substance and Sexual Activity  . Alcohol use: Yes  . Drug use: Never  . Sexual activity: Not on file  Other Topics Concern  . Not on file  Social History Narrative  . Not on file   Social Determinants of Health   Financial Resource Strain:   . Difficulty of Paying Living Expenses:   Food Insecurity:   . Worried About Programme researcher, broadcasting/film/video in the Last Year:   . Barista in the Last Year:   Transportation Needs:   . Freight forwarder (Medical):   Marland Kitchen Lack of Transportation (Non-Medical):   Physical Activity:   . Days of Exercise per Week:   . Minutes of Exercise per Session:   Stress:   . Feeling of Stress :   Social Connections:   . Frequency of Communication with Friends and Family:   . Frequency of Social Gatherings with Friends and Family:   . Attends Religious Services:   . Active Member of Clubs or Organizations:   . Attends Banker Meetings:   Marland Kitchen Marital Status:      Family History: The patient's family history includes CAD in his father and mother.  ROS:   All other ROS reviewed and negative. Pertinent positives noted in the HPI.     EKGs/Labs/Other Studies Reviewed:   The following studies were personally reviewed by me today:  EKG:  EKG is ordered today.  The ekg ordered today demonstrates sinus tachycardia,  heart rate 106, no acute ST-T changes, old anterior infarct, and was personally reviewed by me.   TTE 02/29/2020 1. Left ventricular ejection fraction, by estimation, is 55 to 60%. The  left ventricle has normal function. The left ventricle has no regional  wall motion abnormalities. There is mild concentric left ventricular  hypertrophy. Left ventricular diastolic  parameters are consistent with Grade I diastolic dysfunction (impaired  relaxation).  2. Right ventricular systolic function is normal. The right ventricular  size is normal.  3. The mitral valve is normal in structure. No evidence of mitral valve  regurgitation. No evidence of mitral stenosis.  4. The aortic  valve is normal in structure. Aortic valve regurgitation is  not visualized. No aortic stenosis is present.  5. The inferior vena cava is normal in size with greater than 50%  respiratory variability, suggesting right atrial pressure of 3 mmHg.   Recent Labs: 02/29/2020: TSH 1.549 03/02/2020: Magnesium 2.5 03/06/2020: ALT 43 03/07/2020: BUN 34; Creatinine, Ser 1.08; Hemoglobin 15.6; Platelets 270; Potassium 3.9; Sodium 134   Recent Lipid Panel    Component Value Date/Time   CHOL 222 (H) 02/29/2020 0659   TRIG 231 (H) 02/29/2020 0659   HDL 32 (L) 02/29/2020 0659   CHOLHDL 6.9 02/29/2020 0659   VLDL 46 (H) 02/29/2020 0659   LDLCALC 144 (H) 02/29/2020 0659    Physical Exam:   VS:  BP 134/80   Pulse (!) 106   Ht 5\' 7"  (1.702 m)   Wt 199 lb 12.8 oz (90.6 kg)   SpO2 94%   BMI 31.29 kg/m    Wt Readings from Last 3 Encounters:  03/21/20 199 lb 12.8 oz (90.6 kg)  03/14/20 188 lb 15 oz (85.7 kg)    General: Well nourished, well developed, in no acute distress, left chest tube in place Heart: Atraumatic, normal size  Eyes: PEERLA, EOMI  Neck: Supple, no JVD Endocrine: No thryomegaly Cardiac: Normal S1, S2; RRR; no murmurs, rubs, or gallops Lungs: Clear to auscultation bilaterally, no wheezing, rhonchi or  rales  Abd: Soft, nontender, no hepatomegaly  Ext: No edema Musculoskeletal: No deformities, BUE and BLE strength normal and equal Skin: Sternotomy scar clean dry without evidence of infection Neuro: Alert and oriented to person, place, time, and situation, CNII-XII grossly intact, no focal deficits  Psych: Normal mood and affect   ASSESSMENT:   Rider Ermis is a 54 y.o. male who presents for the following: 1. Coronary artery disease involving coronary bypass graft of native heart without angina pectoris   2. Mixed hyperlipidemia   3. Essential hypertension     PLAN:   1. Coronary artery disease involving coronary bypass graft of native heart without angina pectoris -NSTEMI 02/29/2020. 3v CABG (LIMA-LAD, free RIMA-RCA, SVG-OM1 03/01/2020, 03/03/2020, MD) -Course complicated by pneumothorax with persistent air leak.  He did have a chest tube placed and it is still in place.  He will follow with Dr. Charlett Lango for this moving forward. -We will stop his aspirin 325 and put him on aspirin 81 mg daily.  He did have a non-STEMI so I recommended he restart Plavix and we will do 75 mg daily. -We will stop his diltiazem and start him on metoprolol succinate 100 mg daily.  He was on diltiazem prior to surgery but a beta-blocker will be better given his recent bypass surgery. -He reports palpitations but EKG shows no evidence of atrial fibrillation.  We will continue to monitor his heart rate.  I also asked him to pursue cardiac rehab at [facility].  I think this will be good to get him back in shape.  He will continue not to drive and abide by strict sternal precautions.  He does work as an [employer].  He will not be able to work until cleared by CT surgery.  Clearly his chest tube precludes work at this time. -We will continue Lipitor 80 mg daily.  He will need to have a repeat lipid profile.  He does wish to establish care in our [place] office.  We will have him see one  of the docs there in 1 month and he will  be fasting for repeat lipid profile.  2. Mixed hyperlipidemia -Continue statin.  Does need repeat lipid profile in 1 month.  He will follow in [place].  3. Essential hypertension -Well-controlled today.  Disposition: Return if symptoms worsen or fail to improve.  Medication Adjustments/Labs and Tests Ordered: Current medicines are reviewed at length with the patient today.  Concerns regarding medicines are outlined above.  Orders Placed This Encounter  Procedures  . Lipid panel  . EKG 12-Lead   Meds ordered this encounter  Medications  . aspirin 81 MG EC tablet    Sig: Take 1 tablet (81 mg total) by mouth daily.  . clopidogrel (PLAVIX) 75 MG tablet    Sig: Take 1 tablet (75 mg total) by mouth daily.    Dispense:  90 tablet    Refill:  3  . metoprolol succinate (TOPROL-XL) 100 MG 24 hr tablet    Sig: Take 1 tablet (100 mg total) by mouth daily. Take with or immediately following a meal.    Dispense:  90 tablet    Refill:  3    Patient Instructions  Medication Instructions:  Take Aspirin 81 mg  Start Plavix 75 mg daily  Start Metoprolol Succinate 100 mg daily  Stop Diltiazem   *If you need a refill on your cardiac medications before your next appointment, please call your pharmacy*   Lab Work: LIPID (fasting, needed before follow up with [place] office)   If you have labs (blood work) drawn today and your tests are completely normal, you will receive your results only by: Marland Kitchen MyChart Message (if you have MyChart) OR . A paper copy in the mail If you have any lab test that is abnormal or we need to change your treatment, we will call you to review the results.   Follow-Up: At [facility], you and your health needs are our priority.  As part of our continuing mission to provide you with exceptional heart care, we have created designated Provider Care Teams.  These Care Teams include your primary Cardiologist (physician) and  Advanced Practice Providers (APPs -  Physician Assistants and Nurse Practitioners) who all work together to provide you with the care you need, when you need it.  We recommend signing up for the patient portal called "MyChart".  Sign up information is provided on this After Visit Summary.  MyChart is used to connect with patients for Virtual Visits (Telemedicine).  Patients are able to view lab/test results, encounter notes, upcoming appointments, etc.  Non-urgent messages can be sent to your provider as well.   To learn more about what you can do with MyChart, go to [email or web address].    Your next appointment:   1 month(s) (to establish & CAD management)  The format for your next appointment:   In Person  Provider:   Jyl Heinz, MD        Signed, Addison Naegeli. Audie Box, [place]  [address] [place], [place] [address] [phone]  03/21/2020 12:16 PM

## 2020-03-21 ENCOUNTER — Ambulatory Visit: Payer: BC Managed Care – PPO | Admitting: Cardiovascular Disease

## 2020-03-21 ENCOUNTER — Other Ambulatory Visit: Payer: Self-pay

## 2020-03-21 ENCOUNTER — Encounter: Payer: Self-pay | Admitting: Cardiovascular Disease

## 2020-03-21 ENCOUNTER — Telehealth: Payer: Self-pay

## 2020-03-21 VITALS — BP 134/80 | HR 106 | Ht 67.0 in | Wt 199.8 lb

## 2020-03-21 DIAGNOSIS — I25810 Atherosclerosis of coronary artery bypass graft(s) without angina pectoris: Secondary | ICD-10-CM

## 2020-03-21 DIAGNOSIS — I10 Essential (primary) hypertension: Secondary | ICD-10-CM

## 2020-03-21 DIAGNOSIS — E782 Mixed hyperlipidemia: Secondary | ICD-10-CM | POA: Diagnosis not present

## 2020-03-21 MED ORDER — METOPROLOL SUCCINATE ER 100 MG PO TB24
100.0000 mg | ORAL_TABLET | Freq: Every day | ORAL | 3 refills | Status: DC
Start: 2020-03-21 — End: 2020-09-16

## 2020-03-21 MED ORDER — CLOPIDOGREL BISULFATE 75 MG PO TABS
75.0000 mg | ORAL_TABLET | Freq: Every day | ORAL | 3 refills | Status: DC
Start: 2020-03-21 — End: 2020-09-16

## 2020-03-21 MED ORDER — ASPIRIN 81 MG PO TBEC: 81.0000 mg | DELAYED_RELEASE_TABLET | Freq: Every day | ORAL | Status: AC

## 2020-03-21 NOTE — Patient Instructions (Signed)
Medication Instructions:  Take Aspirin 81 mg  Start Plavix 75 mg daily  Start Metoprolol Succinate 100 mg daily  Stop Diltiazem   *If you need a refill on your cardiac medications before your next appointment, please call your pharmacy*   Lab Work: LIPID (fasting, needed before follow up with [place] office)   If you have labs (blood work) drawn today and your tests are completely normal, you will receive your results only by: Marland Kitchen MyChart Message (if you have MyChart) OR . A paper copy in the mail If you have any lab test that is abnormal or we need to change your treatment, we will call you to review the results.   Follow-Up: At [facility], you and your health needs are our priority.  As part of our continuing mission to provide you with exceptional heart care, we have created designated Provider Care Teams.  These Care Teams include your primary Cardiologist (physician) and Advanced Practice Providers (APPs -  Physician Assistants and Nurse Practitioners) who all work together to provide you with the care you need, when you need it.  We recommend signing up for the patient portal called "MyChart".  Sign up information is provided on this After Visit Summary.  MyChart is used to connect with patients for Virtual Visits (Telemedicine).  Patients are able to view lab/test results, encounter notes, upcoming appointments, etc.  Non-urgent messages can be sent to your provider as well.   To learn more about what you can do with MyChart, go to [email or web address].    Your next appointment:   1 month(s) (to establish & CAD management)  The format for your next appointment:   In Person  Provider:   Belva Crome, MD

## 2020-03-21 NOTE — Telephone Encounter (Signed)
STD forms for [employer] completed and faxed to [phone], original forms mailed to patient's home address.  RTW date aprox 05/27/2020

## 2020-03-25 ENCOUNTER — Other Ambulatory Visit: Payer: Self-pay | Admitting: Thoracic Surgery (Cardiothoracic Vascular Surgery)

## 2020-03-25 DIAGNOSIS — Z951 Presence of aortocoronary bypass graft: Secondary | ICD-10-CM

## 2020-03-26 ENCOUNTER — Ambulatory Visit
Admission: RE | Admit: 2020-03-26 | Discharge: 2020-03-26 | Disposition: A | Discharge status: Home / Self Care | Payer: BC Managed Care – PPO | Source: Ambulatory Visit | Attending: Thoracic Surgery (Cardiothoracic Vascular Surgery) | Admitting: Thoracic Surgery (Cardiothoracic Vascular Surgery)

## 2020-03-26 ENCOUNTER — Other Ambulatory Visit: Payer: Self-pay

## 2020-03-26 ENCOUNTER — Other Ambulatory Visit: Payer: Self-pay | Admitting: *Deleted

## 2020-03-26 ENCOUNTER — Encounter: Payer: Self-pay | Admitting: *Deleted

## 2020-03-26 ENCOUNTER — Other Ambulatory Visit: Payer: Self-pay | Admitting: Thoracic Surgery (Cardiothoracic Vascular Surgery)

## 2020-03-26 ENCOUNTER — Ambulatory Visit (INDEPENDENT_AMBULATORY_CARE_PROVIDER_SITE_OTHER): Payer: Self-pay | Admitting: Thoracic Surgery (Cardiothoracic Vascular Surgery)

## 2020-03-26 VITALS — BP 126/87 | HR 96 | Temp 97.8°F | Resp 20 | Ht 67.0 in | Wt 196.0 lb

## 2020-03-26 DIAGNOSIS — Z951 Presence of aortocoronary bypass graft: Secondary | ICD-10-CM

## 2020-03-26 DIAGNOSIS — J95811 Postprocedural pneumothorax: Secondary | ICD-10-CM

## 2020-03-26 DIAGNOSIS — Z4682 Encounter for fitting and adjustment of non-vascular catheter: Secondary | ICD-10-CM

## 2020-03-26 DIAGNOSIS — I2510 Atherosclerotic heart disease of native coronary artery without angina pectoris: Secondary | ICD-10-CM

## 2020-03-26 NOTE — Progress Notes (Signed)
[address]       [name] [address]             [phone]       HPI: [name] returns for scheduled postoperative follow-up visit  Sean Joseph is a 54 year old man with a history of gastroesophageal reflux and hypertriglyceridemia.  He also has a history of tobacco abuse and was found to have severe bullous emphysema while in the hospital.  He has a strong family history of coronary disease.  He presented with unstable chest pain and ruled in for non-ST elevation MI.  At catheterization he had three-vessel coronary disease.  He underwent coronary artery bypass grafting x3 with bilateral mammary arteries on 03/01/2020.  Postoperatively he had acute on chronic respiratory failure and required BiPAP.  He developed a air leak on the left side.  His respiratory status improved and he was able to stop BiPAP his air leak never resolved.  He had bronchoscopy for endobronchial valve placement on 03/08/2020.  His air leak improved after that but he still had a small leak and went home with a chest tube on 03/14/2020.  He feels better.  He is not had any recurrent angina.  He has had some issues with reflux.  He has had some pain from the chest tube.  He is only taking oxycodone occasionally when his pain is severe.  He has not noticed any bubbling from the chest tube over the past several days.  Past Medical History:  Diagnosis Date  . GERD (gastroesophageal reflux disease)     Current Outpatient Medications  Medication Sig Dispense Refill  . albuterol (VENTOLIN HFA) 108 (90 Base) MCG/ACT inhaler Inhale 1-2 puffs into the lungs every 4 (four) hours as needed for wheezing or shortness of breath.    Marland Kitchen aspirin 81 MG EC tablet Take 1 tablet (81 mg total) by mouth daily.    Marland Kitchen atorvastatin (LIPITOR) 80 MG tablet Take 1 tablet (80 mg total) by mouth daily at 6 PM. 30 tablet 2  . clopidogrel (PLAVIX) 75 MG tablet Take 1 tablet (75 mg total) by mouth daily. 90 tablet 3  .  fexofenadine-pseudoephedrine (ALLEGRA-D 24) 180-240 MG 24 hr tablet Take 1 tablet by mouth daily.    . Fluticasone-Umeclidin-Vilant (TRELEGY ELLIPTA) 100-62.5-25 MCG/INH AEPB Inhale into the lungs daily.    . metoprolol succinate (TOPROL-XL) 100 MG 24 hr tablet Take 1 tablet (100 mg total) by mouth daily. Take with or immediately following a meal. 90 tablet 3  . montelukast (SINGULAIR) 10 MG tablet Take 1 tablet (10 mg total) by mouth at bedtime. (Patient taking differently: Take 10 mg by mouth daily. ) 90 tablet 0  . pantoprazole (PROTONIX) 40 MG tablet Take 1 tablet (40 mg total) by mouth daily. 30 tablet 2   No current facility-administered medications for this visit.    Physical Exam BP 126/87   Pulse 96   Temp 97.8 F (36.6 C) (Skin)   Resp 20   Ht 5\' 7"  (1.702 m)   Wt 196 lb (88.9 kg)   SpO2 93% Comment: RA  BMI 30.49 kg/m  54 year old man in no acute distress Alert and oriented x3 with no focal deficits Lungs diminished breath sounds bilaterally but clear with no wheezing No air leak Cardiac regular rate and rhythm normal S1 and S2, no rub or murmur Sternum stable, incision healing well Leg incision healing well, no edema  Diagnostic Tests: CHEST - 2 VIEW  COMPARISON:  03/14/2020  FINDINGS: LEFT thoracostomy tube again identified.  Normal heart size post CABG.  Stable mediastinal contours and pulmonary vascularity.  Bibasilar atelectasis versus scarring.  Small loculated pneumothorax at lateral LEFT lung base again identified.  Endobronchial valves noted at mid inferior LEFT hilum.  No pleural effusion identified.  IMPRESSION: Small loculated LEFT pneumothorax at lateral lung base despite thoracostomy tube.  Bibasilar atelectasis.  These results will be called to the ordering clinician or representative by the Radiologist Assistant, and communication documented in the PACS or [employer].   Electronically Signed   By: Lavonia Dana  M.D.   On: 03/26/2020 11:00 I personally reviewed the chest x-ray images and concur with the findings noted above.  There is a loculated pneumo laterally on the left base.  There likely was something there on his film prior to discharge.  In any event there is no air leak from the tube and the tube is medial and not likely to communicate with a small loculated area.  Impression: Sean Joseph is a 54 year old man with multiple cardiac risk factor who underwent coronary bypass grafting x3 with bilateral mammary arteries on 03/01/2020.  His postoperative course was complicated by respiratory failure requiring BiPAP and a left pneumothorax.  He had an ongoing air leak and required IBV placement and went home with a chest tube.  CAD-on aspirin and Plavix.  No angina since discharge.  Reflux-he is on Protonix.  He can also use Maalox or Tums if needed for indigestion.  Pneumothorax with prolonged air leak-chest x-ray shows a small area of loculated pneumothorax at the left base.  There likely was some degree of pneumo there on his previous film as well, although it is a little less certain as there were some overlapping shadows.  There is no air leak with repeated coughing.  We will go ahead and DC the chest tube today.  He and his wife understand there is always some risk of recurrent pneumothorax and needing to have the tube replaced.  IBV-placed for prolonged air leak.  Will need to be removed in about 3 weeks.  We will tentatively plan for Thursday, 04/18/2020.  He may begin driving starting tomorrow.  Appropriate precautions were discussed.  He should not lift anything over 10 pounds for another 2 weeks.  Plan: Remove chest tube Repeat chest x-ray prior to going home Return in 1 week with PA and lateral chest x-ray Bronchoscopy with IBV removal on Thursday, 04/18/2020  Melrose Nakayama, MD [facility] [phone]

## 2020-04-01 ENCOUNTER — Other Ambulatory Visit: Payer: Self-pay | Admitting: Thoracic Surgery (Cardiothoracic Vascular Surgery)

## 2020-04-01 DIAGNOSIS — Z951 Presence of aortocoronary bypass graft: Secondary | ICD-10-CM

## 2020-04-02 ENCOUNTER — Ambulatory Visit (INDEPENDENT_AMBULATORY_CARE_PROVIDER_SITE_OTHER): Payer: Self-pay | Admitting: Thoracic Surgery (Cardiothoracic Vascular Surgery)

## 2020-04-02 ENCOUNTER — Other Ambulatory Visit: Payer: Self-pay

## 2020-04-02 ENCOUNTER — Encounter: Payer: Self-pay | Admitting: Thoracic Surgery (Cardiothoracic Vascular Surgery)

## 2020-04-02 ENCOUNTER — Ambulatory Visit
Admission: RE | Admit: 2020-04-02 | Discharge: 2020-04-02 | Disposition: A | Discharge status: Home / Self Care | Payer: BC Managed Care – PPO | Source: Ambulatory Visit | Attending: Thoracic Surgery (Cardiothoracic Vascular Surgery) | Admitting: Thoracic Surgery (Cardiothoracic Vascular Surgery)

## 2020-04-02 ENCOUNTER — Ambulatory Visit: Payer: BC Managed Care – PPO | Admitting: Thoracic Surgery (Cardiothoracic Vascular Surgery)

## 2020-04-02 VITALS — BP 124/84 | HR 95 | Temp 97.9°F | Resp 16 | Ht 67.0 in | Wt 199.0 lb

## 2020-04-02 DIAGNOSIS — Z951 Presence of aortocoronary bypass graft: Secondary | ICD-10-CM

## 2020-04-02 DIAGNOSIS — Z9889 Other specified postprocedural states: Secondary | ICD-10-CM

## 2020-04-02 DIAGNOSIS — I2510 Atherosclerotic heart disease of native coronary artery without angina pectoris: Secondary | ICD-10-CM

## 2020-04-02 NOTE — Progress Notes (Signed)
[address]       Sean Joseph [address]             [phone]      HPI: Sean Joseph returns for scheduled follow-up  Sean Joseph is a 54 year old man with a history of gastroesophageal reflux, hypertriglyceridemia, tobacco abuse, bullous emphysema, and coronary disease.  He also has a strong family history of coronary disease.  He presented with unstable chest pain.  He ruled in for non-ST elevation MI.  He was found to have severe three-vessel coronary disease.  He underwent coronary bypass grafting x3 on 03/01/2020.  We did use bilateral mammaries.  Postoperatively he had acute on chronic respiratory failure requiring BiPAP and developed an air leak.  I placed IBV in the left upper lobe bronchi on 03/08/2020.  He ultimately went home with his chest tube in place.  I saw him in the office last week.  We remove the tube.  He tolerated that well.  He feels well.  He is not taking any narcotics.  He does have some occasional soreness when he moves in certain ways.  He has not had any respiratory issues.   Current Outpatient Medications  Medication Sig Dispense Refill  . albuterol (VENTOLIN HFA) 108 (90 Base) MCG/ACT inhaler Inhale 1-2 puffs into the lungs every 4 (four) hours as needed for wheezing or shortness of breath.    Marland Kitchen aspirin 81 MG EC tablet Take 1 tablet (81 mg total) by mouth daily.    Marland Kitchen atorvastatin (LIPITOR) 80 MG tablet Take 1 tablet (80 mg total) by mouth daily at 6 PM. 30 tablet 2  . clopidogrel (PLAVIX) 75 MG tablet Take 1 tablet (75 mg total) by mouth daily. 90 tablet 3  . fexofenadine-pseudoephedrine (ALLEGRA-D 24) 180-240 MG 24 hr tablet Take 1 tablet by mouth daily.    . Fluticasone-Umeclidin-Vilant (TRELEGY ELLIPTA) 100-62.5-25 MCG/INH AEPB Inhale into the lungs daily.    . metoprolol succinate (TOPROL-XL) 100 MG 24 hr tablet Take 1 tablet (100 mg total) by mouth daily. Take with or immediately following a meal. 90 tablet 3  . montelukast  (SINGULAIR) 10 MG tablet Take 1 tablet (10 mg total) by mouth at bedtime. (Patient taking differently: Take 10 mg by mouth daily. ) 90 tablet 0  . pantoprazole (PROTONIX) 40 MG tablet Take 1 tablet (40 mg total) by mouth daily. 30 tablet 2   No current facility-administered medications for this visit.    Physical Exam BP 124/84 (BP Location: Left Arm, Patient Position: Sitting, Cuff Size: Normal)   Pulse 95   Temp 97.9 F (36.6 C)   Resp 16   Ht 5\' 7"  (1.702 m)   Wt 199 lb (90.3 kg)   SpO2 95% Comment: RA  BMI 31.13 kg/m  55 year old man in no acute distress Alert and oriented x3 with no focal deficits Lungs clear equal breath sounds bilaterally Cardiac regular rate and rhythm normal S1 and S2 Sternum stable, incision healing well  Diagnostic Tests: CHEST - 2 VIEW  COMPARISON:  Most recent radiograph 03/26/2020  FINDINGS: The previous left pneumothorax has resolved. Trace residual pneumomediastinum adjacent to the aortic arch persists. Post median sternotomy and CABG with bronchial occluded device in the left hilar region unchanged. Improving streaky opacities in the mid lower lung zones likely atelectasis. Upper normal heart size with unchanged mediastinal contours. No pulmonary edema. No significant pleural effusion. Stable osseous structures.  IMPRESSION: 1. Resolved left pneumothorax. Suspect trace residual pneumomediastinum.  2. Improving streaky opacities in the mid and lower lung zones likely atelectasis.   Electronically Signed   By: Sean  Joseph M.D.   On: 04/02/2020 15:25 I personally reviewed the chest x-ray images.  I doubt that he has any pneumomediastinum.  I suspect that line on the x-ray is due to his blebs in the left upper lobe.  Impression: Sean Joseph is a 53-year-old man with a history of hypertension, reflux, tobacco abuse, bullous emphysema, CAD, non-STEMI, who underwent coronary artery bypass grafting x3 a month ago complicated by  an air leak after requiring BiPAP for acute on chronic respiratory failure.  Overall he is doing well at this point.  We get his chest tube out last week.  His chest x-ray today is stable.  He is not having any respiratory issues.  We will need to go back and remove those IBV with a bronchoscopy.  He is scheduled for 04/18/2020.  He may drive.  Appropriate precautions were discussed.  He should not lift anything over 10 pounds for another 2 weeks.  Plan: Bronchoscopy for IBV removal on 04/18/2020.  He does not need to hold Plavix for that.  I will see him back in the office 2 weeks after the valve removal (4 weeks from today)  Sean Joseph C Mardelle Pandolfi, MD [facility] [phone]    

## 2020-04-02 NOTE — H&P (View-Only) (Signed)
[address]       [name] [address]             [phone]      HPI: Sean Joseph returns for scheduled follow-up  Sean Joseph is a 54 year old man with a history of gastroesophageal reflux, hypertriglyceridemia, tobacco abuse, bullous emphysema, and coronary disease.  He also has a strong family history of coronary disease.  He presented with unstable chest pain.  He ruled in for non-ST elevation MI.  He was found to have severe three-vessel coronary disease.  He underwent coronary bypass grafting x3 on 03/01/2020.  We did use bilateral mammaries.  Postoperatively he had acute on chronic respiratory failure requiring BiPAP and developed an air leak.  I placed IBV in the left upper lobe bronchi on 03/08/2020.  He ultimately went home with his chest tube in place.  I saw him in the office last week.  We remove the tube.  He tolerated that well.  He feels well.  He is not taking any narcotics.  He does have some occasional soreness when he moves in certain ways.  He has not had any respiratory issues.   Current Outpatient Medications  Medication Sig Dispense Refill  . albuterol (VENTOLIN HFA) 108 (90 Base) MCG/ACT inhaler Inhale 1-2 puffs into the lungs every 4 (four) hours as needed for wheezing or shortness of breath.    Marland Kitchen aspirin 81 MG EC tablet Take 1 tablet (81 mg total) by mouth daily.    Marland Kitchen atorvastatin (LIPITOR) 80 MG tablet Take 1 tablet (80 mg total) by mouth daily at 6 PM. 30 tablet 2  . clopidogrel (PLAVIX) 75 MG tablet Take 1 tablet (75 mg total) by mouth daily. 90 tablet 3  . fexofenadine-pseudoephedrine (ALLEGRA-D 24) 180-240 MG 24 hr tablet Take 1 tablet by mouth daily.    . Fluticasone-Umeclidin-Vilant (TRELEGY ELLIPTA) 100-62.5-25 MCG/INH AEPB Inhale into the lungs daily.    . metoprolol succinate (TOPROL-XL) 100 MG 24 hr tablet Take 1 tablet (100 mg total) by mouth daily. Take with or immediately following a meal. 90 tablet 3  . montelukast  (SINGULAIR) 10 MG tablet Take 1 tablet (10 mg total) by mouth at bedtime. (Patient taking differently: Take 10 mg by mouth daily. ) 90 tablet 0  . pantoprazole (PROTONIX) 40 MG tablet Take 1 tablet (40 mg total) by mouth daily. 30 tablet 2   No current facility-administered medications for this visit.    Physical Exam BP 124/84 (BP Location: Left Arm, Patient Position: Sitting, Cuff Size: Normal)   Pulse 95   Temp 97.9 F (36.6 C)   Resp 16   Ht 5\' 7"  (1.702 m)   Wt 199 lb (90.3 kg)   SpO2 95% Comment: RA  BMI 31.13 kg/m  54 year old man in no acute distress Alert and oriented x3 with no focal deficits Lungs clear equal breath sounds bilaterally Cardiac regular rate and rhythm normal S1 and S2 Sternum stable, incision healing well  Diagnostic Tests: CHEST - 2 VIEW  COMPARISON:  Most recent radiograph 03/26/2020  FINDINGS: The previous left pneumothorax has resolved. Trace residual pneumomediastinum adjacent to the aortic arch persists. Post median sternotomy and CABG with bronchial occluded device in the left hilar region unchanged. Improving streaky opacities in the mid lower lung zones likely atelectasis. Upper normal heart size with unchanged mediastinal contours. No pulmonary edema. No significant pleural effusion. Stable osseous structures.  IMPRESSION: 1. Resolved left pneumothorax. Suspect trace residual pneumomediastinum.  2. Improving streaky opacities in the mid and lower lung zones likely atelectasis.   Electronically Signed   By: Keith Rake M.D.   On: 04/02/2020 15:25 I personally reviewed the chest x-ray images.  I doubt that he has any pneumomediastinum.  I suspect that line on the x-ray is due to his blebs in the left upper lobe.  Impression: Sean Joseph is a 54 year old man with a history of hypertension, reflux, tobacco abuse, bullous emphysema, CAD, non-STEMI, who underwent coronary artery bypass grafting x3 a month ago complicated by  an air leak after requiring BiPAP for acute on chronic respiratory failure.  Overall he is doing well at this point.  We get his chest tube out last week.  His chest x-ray today is stable.  He is not having any respiratory issues.  We will need to go back and remove those IBV with a bronchoscopy.  He is scheduled for 04/18/2020.  He may drive.  Appropriate precautions were discussed.  He should not lift anything over 10 pounds for another 2 weeks.  Plan: Bronchoscopy for IBV removal on 04/18/2020.  He does not need to hold Plavix for that.  I will see him back in the office 2 weeks after the valve removal (4 weeks from today)  Melrose Nakayama, MD [facility] [phone]

## 2020-04-10 LAB — FUNGUS CULTURE WITH STAIN

## 2020-04-10 LAB — FUNGUS CULTURE RESULT

## 2020-04-10 LAB — FUNGAL ORGANISM REFLEX

## 2020-04-12 NOTE — Pre-Procedure Instructions (Signed)
Fines Kimberlin  04/12/2020      [id number] - [place], [address] AT [place] [address] [place] [address] Phone: [phone] Fax: [phone]    Your procedure is scheduled on June 3  Report to [facility] entrance A at 10:00 A.M.  Call this number if you have problems the morning of surgery: [phone]     Remember:  Do not eat or drink after midnight.      Take these medicines the morning of surgery with A SIP OF WATER :               Albuterol inhaler if needed--bring to hospital              Allegra              trelegy ellipta              Metoprolol (toprolol-xl)              singulair (montelukast             Pantoprazole (protonix)             7 days prior to surgery STOP taking any Aspirin and plavix (unless otherwise instructed by your surgeon), Aleve, Naproxen, Ibuprofen, Motrin, Advil, Goody's, BC's, all herbal medications, fish oil, and all vitamins.              Follow your surgeon's instructions on when to stop Aspirin and plaxix.  If no instructions were given by your surgeon then you will need to call the office to get those instructions.      Do not wear jewelry.  Do not wear lotions, powders, or perfumes, or deodorant.  Do not shave 48 hours prior to surgery.  Men may shave face and neck.  Do not bring valuables to the hospital.  [facility] is not responsible for any belongings or valuables.  Contacts, dentures or bridgework may not be worn into surgery.  Leave your suitcase in the car.  After surgery it may be brought to your room.  For patients admitted to the hospital, discharge time will be determined by your treatment team.  Patients discharged the day of surgery will not be allowed to drive home.    Special instructions:  [place]- Preparing For Surgery  Before surgery, you can play an important role. Because skin is not sterile, your skin needs to be as free of germs  as possible. You can reduce the number of germs on your skin by washing with CHG (chlorahexidine gluconate) Soap before surgery.  CHG is an antiseptic cleaner which kills germs and bonds with the skin to continue killing germs even after washing.    Oral Hygiene is also important to reduce your risk of infection.  Remember - BRUSH YOUR TEETH THE MORNING OF SURGERY WITH YOUR REGULAR TOOTHPASTE  Please do not use if you have an allergy to CHG or antibacterial soaps. If your skin becomes reddened/irritated stop using the CHG.  Do not shave (including legs and underarms) for at least 48 hours prior to first CHG shower. It is OK to shave your face.  Please follow these instructions carefully.   1. Shower the NIGHT BEFORE SURGERY and the MORNING OF SURGERY with CHG.   2. If you chose to wash your hair, wash your hair first as usual with your normal shampoo.  3. After you shampoo,  rinse your hair and body thoroughly to remove the shampoo.  4. Use CHG as you would any other liquid soap. You can apply CHG directly to the skin and wash gently with a scrungie or a clean washcloth.   5. Apply the CHG Soap to your body ONLY FROM THE NECK DOWN.  Do not use on open wounds or open sores. Avoid contact with your eyes, ears, mouth and genitals (private parts). Wash Face and genitals (private parts)  with your normal soap.  6. Wash thoroughly, paying special attention to the area where your surgery will be performed.  7. Thoroughly rinse your body with warm water from the neck down.  8. DO NOT shower/wash with your normal soap after using and rinsing off the CHG Soap.  9. Pat yourself dry with a CLEAN TOWEL.  10. Wear CLEAN PAJAMAS to bed the night before surgery, wear comfortable clothes the morning of surgery  11. Place CLEAN SHEETS on your bed the night of your first shower and DO NOT SLEEP WITH PETS.    Day of Surgery:  Do not apply any deodorants/lotions.  Please wear clean clothes to the  hospital/surgery center.   Remember to brush your teeth WITH YOUR REGULAR TOOTHPASTE.    Please read over the following fact sheets that you were given. Coughing and Deep Breathing and Surgical Site Infection Prevention

## 2020-04-16 ENCOUNTER — Encounter (HOSPITAL_COMMUNITY): Payer: Self-pay

## 2020-04-16 ENCOUNTER — Other Ambulatory Visit: Payer: Self-pay

## 2020-04-16 ENCOUNTER — Encounter (HOSPITAL_COMMUNITY)
Admission: RE | Admit: 2020-04-16 | Discharge: 2020-04-16 | Disposition: A | Discharge status: Home / Self Care | Payer: BC Managed Care – PPO | Source: Ambulatory Visit | Attending: Thoracic Surgery (Cardiothoracic Vascular Surgery) | Admitting: Thoracic Surgery (Cardiothoracic Vascular Surgery)

## 2020-04-16 ENCOUNTER — Ambulatory Visit (HOSPITAL_COMMUNITY)
Admission: RE | Admit: 2020-04-16 | Discharge: 2020-04-16 | Disposition: A | Discharge status: Home / Self Care | Payer: BC Managed Care – PPO | Source: Ambulatory Visit | Attending: Thoracic Surgery (Cardiothoracic Vascular Surgery) | Admitting: Thoracic Surgery (Cardiothoracic Vascular Surgery)

## 2020-04-16 ENCOUNTER — Other Ambulatory Visit (HOSPITAL_COMMUNITY)
Admission: RE | Admit: 2020-04-16 | Discharge: 2020-04-16 | Disposition: A | Discharge status: Home / Self Care | Payer: BC Managed Care – PPO | Source: Ambulatory Visit | Attending: Thoracic Surgery (Cardiothoracic Vascular Surgery) | Admitting: Thoracic Surgery (Cardiothoracic Vascular Surgery)

## 2020-04-16 DIAGNOSIS — J95811 Postprocedural pneumothorax: Secondary | ICD-10-CM | POA: Diagnosis not present

## 2020-04-16 DIAGNOSIS — Z01818 Encounter for other preprocedural examination: Secondary | ICD-10-CM | POA: Diagnosis not present

## 2020-04-16 DIAGNOSIS — Z20822 Contact with and (suspected) exposure to covid-19: Secondary | ICD-10-CM | POA: Insufficient documentation

## 2020-04-16 HISTORY — DX: Angina pectoris, unspecified: I20.9

## 2020-04-16 HISTORY — DX: Chronic obstructive pulmonary disease, unspecified: J44.9

## 2020-04-16 HISTORY — DX: Atherosclerotic heart disease of native coronary artery without angina pectoris: I25.10

## 2020-04-16 HISTORY — DX: Essential (primary) hypertension: I10

## 2020-04-16 HISTORY — DX: Acute myocardial infarction, unspecified: I21.9

## 2020-04-16 LAB — COMPREHENSIVE METABOLIC PANEL
ALT: 28 U/L (ref 0–44)
AST: 21 U/L (ref 15–41)
Albumin: 3.7 g/dL (ref 3.5–5.0)
Alkaline Phosphatase: 78 U/L (ref 38–126)
Anion gap: 9 (ref 5–15)
BUN: 6 mg/dL (ref 6–20)
CO2: 27 mmol/L (ref 22–32)
Calcium: 9.5 mg/dL (ref 8.9–10.3)
Chloride: 105 mmol/L (ref 98–111)
Creatinine, Ser: 0.73 mg/dL (ref 0.61–1.24)
GFR calc Af Amer: >60 mL/min (ref >60)
GFR calc non Af Amer: >60 mL/min (ref >60)
Glucose, Bld: 78 mg/dL (ref 70–99)
Potassium: 3.7 mmol/L (ref 3.5–5.1)
Sodium: 141 mmol/L (ref 135–145)
Total Bilirubin: 0.6 mg/dL (ref 0.3–1.2)
Total Protein: 7.5 g/dL (ref 6.5–8.1)

## 2020-04-16 LAB — CBC
HCT: 47.4 % (ref 39.0–52.0)
Hemoglobin: 16.2 g/dL (ref 13.0–17.0)
MCH: 31.5 pg (ref 26.0–34.0)
MCHC: 34.2 g/dL (ref 30.0–36.0)
MCV: 92.2 fL (ref 80.0–100.0)
Platelets: 272 10*3/uL (ref 150–400)
RBC: 5.14 MIL/uL (ref 4.22–5.81)
RDW: 13.7 % (ref 11.5–15.5)
WBC: 13 10*3/uL — ABNORMAL HIGH (ref 4.0–10.5)
nRBC: 0 % (ref 0.0–0.2)

## 2020-04-16 LAB — PROTIME-INR
INR: 1.1 (ref 0.8–1.2)
Prothrombin Time: 13.7 seconds (ref 11.4–15.2)

## 2020-04-16 LAB — APTT: aPTT: 30 seconds (ref 24–36)

## 2020-04-16 NOTE — Progress Notes (Signed)
PCP:  Blane Ohara, MD Cardiologist:  Sande Rives, MD  EKG:  03/21/20 CXR:  04/16/20 ECHO:  02/29/20 Stress Test:  Denies Cardiac Cath:  02/29/20  Covid test 04/16/20  Anesthesia Testing:  Yes, cardiac history.  CABG 4/21.  Note by Dr. Dorris Fetch dated 5/18 states does not need to hold Plavix.   Patient denies shortness of breath, fever, cough, and chest pain at PAT appointment.  Patient verbalized understanding of instructions provided today at the PAT appointment.  Patient asked to review instructions at home and day of surgery.

## 2020-04-17 LAB — SARS CORONAVIRUS 2 (TAT 6-24 HRS): SARS Coronavirus 2: NEGATIVE

## 2020-04-17 NOTE — Progress Notes (Signed)
Anesthesia Chart Review:  Recent NSTEMI 02/29/2020. 3v CABG (LIMA-LAD, free RIMA-RCA, SVG-OM1 03/01/2020). Course complicated by pneumothorax with persistent air leak. Dr. Dorris Fetch place and IBV in the left upper lobe bronchi on 03/08/2020. He did also have a chest tube which was removed 04/02/20.  Last seen by cardiologist Dr. Flora Lipps 03/21/20. Per note doing well, walking daily without CP or SOB. His ASA 325mg  was reduced to 81mg  and he was restarted on Plavix. Diltiazem was dc'd and he was stared on metoprolol.   Per Dr. note 04/02/20, pt does not need to hold Plavix.  COPD maintained on Trelegy Ellipta.  Preop labs reviewed, unremarkable.   EKG 03/21/20: Sinus tach. Rate 106.  CHEST - 2 VIEW 04/16/20:  COMPARISON:  04/02/2020.  FINDINGS: Prior CABG. Heart size stable. Intrabronchial valves again noted. Bibasilar atelectasis/infiltrates again noted without interim change. No pleural effusion or pneumothorax.  IMPRESSION: Endobronchial valves again noted. Bibasilar atelectasis/infiltrates again noted. No pleural effusion or pneumothorax.   TTE 02/29/20 (pre CABG): 1. Left ventricular ejection fraction, by estimation, is 55 to 60%. The  left ventricle has normal function. The left ventricle has no regional  wall motion abnormalities. There is mild concentric left ventricular  hypertrophy. Left ventricular diastolic  parameters are consistent with Grade I diastolic dysfunction (impaired  relaxation).  2. Right ventricular systolic function is normal. The right ventricular  size is normal.  3. The mitral valve is normal in structure. No evidence of mitral valve  regurgitation. No evidence of mitral stenosis.  4. The aortic valve is normal in structure. Aortic valve regurgitation is  not visualized. No aortic stenosis is present.  5. The inferior vena cava is normal in size with greater than 50%  respiratory variability, suggesting right atrial pressure of 3 mmHg.     04/04/2020 [facility]/Anesthesiology Phone [phone] 04/17/2020 10:52 AM

## 2020-04-17 NOTE — Anesthesia Preprocedure Evaluation (Addendum)
Anesthesia Evaluation  Patient identified by MRN, date of birth, ID band Patient awake    Reviewed: Allergy & Precautions, NPO status , Patient's Chart, lab work & pertinent test results  Airway Mallampati: I  TM Distance: >3 FB Neck ROM: Full    Dental  (+) Teeth Intact, Dental Advisory Given   Pulmonary former smoker,    breath sounds clear to auscultation       Cardiovascular hypertension,  Rhythm:Regular Rate:Normal     Neuro/Psych    GI/Hepatic   Endo/Other    Renal/GU      Musculoskeletal   Abdominal Normal abdominal exam  (+)   Peds  Hematology   Anesthesia Other Findings   Reproductive/Obstetrics                            Anesthesia Physical Anesthesia Plan  ASA: III  Anesthesia Plan: General   Post-op Pain Management:    Induction: Intravenous  PONV Risk Score and Plan: 3 and Ondansetron, Dexamethasone and Midazolam  Airway Management Planned: Oral ETT  Additional Equipment: None  Intra-op Plan:   Post-operative Plan: Extubation in OR  Informed Consent: I have reviewed the patients History and Physical, chart, labs and discussed the procedure including the risks, benefits and alternatives for the proposed anesthesia with the patient or authorized representative who has indicated his/her understanding and acceptance.     Dental advisory given  Plan Discussed with: CRNA  Anesthesia Plan Comments: (PAT note by Antionette Poles, PA-C: Recent NSTEMI 02/29/2020. 3v CABG (LIMA-LAD, free RIMA-RCA, SVG-OM1 03/01/2020). Course complicated by pneumothorax with persistent air leak. Dr. Dorris Fetch place and IBV in the left upper lobe bronchi on 03/08/2020. He did also have a chest tube which was removed 04/02/20.  Last seen by cardiologist Dr. Flora Lipps 03/21/20. Per note doing well, walking daily without CP or SOB. His ASA 325mg  was reduced to 81mg  and he was restarted on Plavix. Diltiazem  was dc'd and he was stared on metoprolol.   Per Dr. note 04/02/20, pt does not need to hold Plavix.  COPD maintained on Trelegy Ellipta.  Preop labs reviewed, unremarkable.   EKG 03/21/20: Sinus tach. Rate 106.  CHEST - 2 VIEW 04/16/20:  COMPARISON:  04/02/2020.  FINDINGS: Prior CABG. Heart size stable. Intrabronchial valves again noted. Bibasilar atelectasis/infiltrates again noted without interim change. No pleural effusion or pneumothorax.  IMPRESSION: Endobronchial valves again noted. Bibasilar atelectasis/infiltrates again noted. No pleural effusion or pneumothorax.   TTE 02/29/20 (pre CABG): 1. Left ventricular ejection fraction, by estimation, is 55 to 60%. The  left ventricle has normal function. The left ventricle has no regional  wall motion abnormalities. There is mild concentric left ventricular  hypertrophy. Left ventricular diastolic  parameters are consistent with Grade I diastolic dysfunction (impaired  relaxation).  2. Right ventricular systolic function is normal. The right ventricular  size is normal.  3. The mitral valve is normal in structure. No evidence of mitral valve  regurgitation. No evidence of mitral stenosis.  4. The aortic valve is normal in structure. Aortic valve regurgitation is  not visualized. No aortic stenosis is present.  5. The inferior vena cava is normal in size with greater than 50%  respiratory variability, suggesting right atrial pressure of 3 mmHg.   )       Anesthesia Quick Evaluation

## 2020-04-18 ENCOUNTER — Encounter (HOSPITAL_COMMUNITY): Payer: Self-pay | Admitting: Thoracic Surgery (Cardiothoracic Vascular Surgery)

## 2020-04-18 ENCOUNTER — Ambulatory Visit (HOSPITAL_COMMUNITY)
Admission: RE | Admit: 2020-04-18 | Discharge: 2020-04-18 | Disposition: A | Discharge status: Home / Self Care | Payer: BC Managed Care – PPO | Attending: Thoracic Surgery (Cardiothoracic Vascular Surgery) | Admitting: Thoracic Surgery (Cardiothoracic Vascular Surgery)

## 2020-04-18 ENCOUNTER — Ambulatory Visit (HOSPITAL_COMMUNITY): Payer: BC Managed Care – PPO

## 2020-04-18 ENCOUNTER — Ambulatory Visit (HOSPITAL_COMMUNITY): Payer: BC Managed Care – PPO | Admitting: Physician Assistant

## 2020-04-18 ENCOUNTER — Encounter (HOSPITAL_COMMUNITY)
Admission: RE | Disposition: A | Discharge status: Home / Self Care | Payer: Self-pay | Source: Home / Self Care | Attending: Thoracic Surgery (Cardiothoracic Vascular Surgery)

## 2020-04-18 ENCOUNTER — Other Ambulatory Visit: Payer: Self-pay

## 2020-04-18 DIAGNOSIS — I10 Essential (primary) hypertension: Secondary | ICD-10-CM | POA: Diagnosis not present

## 2020-04-18 DIAGNOSIS — I252 Old myocardial infarction: Secondary | ICD-10-CM | POA: Insufficient documentation

## 2020-04-18 DIAGNOSIS — J439 Emphysema, unspecified: Secondary | ICD-10-CM | POA: Insufficient documentation

## 2020-04-18 DIAGNOSIS — Z7951 Long term (current) use of inhaled steroids: Secondary | ICD-10-CM | POA: Diagnosis not present

## 2020-04-18 DIAGNOSIS — Z87891 Personal history of nicotine dependence: Secondary | ICD-10-CM | POA: Insufficient documentation

## 2020-04-18 DIAGNOSIS — Z7982 Long term (current) use of aspirin: Secondary | ICD-10-CM | POA: Diagnosis not present

## 2020-04-18 DIAGNOSIS — Z8249 Family history of ischemic heart disease and other diseases of the circulatory system: Secondary | ICD-10-CM | POA: Diagnosis not present

## 2020-04-18 DIAGNOSIS — Z951 Presence of aortocoronary bypass graft: Secondary | ICD-10-CM | POA: Diagnosis not present

## 2020-04-18 DIAGNOSIS — I2510 Atherosclerotic heart disease of native coronary artery without angina pectoris: Secondary | ICD-10-CM | POA: Insufficient documentation

## 2020-04-18 DIAGNOSIS — J9382 Other air leak: Secondary | ICD-10-CM | POA: Diagnosis not present

## 2020-04-18 DIAGNOSIS — Z4682 Encounter for fitting and adjustment of non-vascular catheter: Secondary | ICD-10-CM | POA: Insufficient documentation

## 2020-04-18 DIAGNOSIS — J95811 Postprocedural pneumothorax: Secondary | ICD-10-CM

## 2020-04-18 DIAGNOSIS — Z7902 Long term (current) use of antithrombotics/antiplatelets: Secondary | ICD-10-CM | POA: Diagnosis not present

## 2020-04-18 DIAGNOSIS — Z9889 Other specified postprocedural states: Secondary | ICD-10-CM

## 2020-04-18 DIAGNOSIS — J984 Other disorders of lung: Secondary | ICD-10-CM | POA: Insufficient documentation

## 2020-04-18 DIAGNOSIS — K219 Gastro-esophageal reflux disease without esophagitis: Secondary | ICD-10-CM | POA: Diagnosis not present

## 2020-04-18 DIAGNOSIS — E781 Pure hyperglyceridemia: Secondary | ICD-10-CM | POA: Insufficient documentation

## 2020-04-18 DIAGNOSIS — Z79899 Other long term (current) drug therapy: Secondary | ICD-10-CM | POA: Insufficient documentation

## 2020-04-18 HISTORY — PX: VIDEO BRONCHOSCOPY WITH INSERTION OF INTERBRONCHIAL VALVE (IBV): SHX6178

## 2020-04-18 SURGERY — BRONCHOSCOPY, FLEXIBLE, WITH INTRABRONCHIAL VALVE INSERTION
Anesthesia: General

## 2020-04-18 MED ORDER — METOPROLOL SUCCINATE ER 25 MG PO TB24
ORAL_TABLET | ORAL | Status: AC
  Administered 2020-04-18: 100 mg via ORAL
  Filled 2020-04-18: qty 4

## 2020-04-18 MED ORDER — ONDANSETRON HCL 4 MG/2ML IJ SOLN
INTRAMUSCULAR | Status: AC
  Filled 2020-04-18: qty 2

## 2020-04-18 MED ORDER — LACTATED RINGERS IV SOLN: INTRAVENOUS | Status: DC | PRN

## 2020-04-18 MED ORDER — SUCCINYLCHOLINE CHLORIDE 200 MG/10ML IV SOSY
PREFILLED_SYRINGE | INTRAVENOUS | Status: AC
  Filled 2020-04-18: qty 10

## 2020-04-18 MED ORDER — MIDAZOLAM HCL 2 MG/2ML IJ SOLN
INTRAMUSCULAR | Status: AC
  Filled 2020-04-18: qty 2

## 2020-04-18 MED ORDER — CHLORHEXIDINE GLUCONATE 0.12 % MT SOLN
OROMUCOSAL | Status: AC
  Administered 2020-04-18: 15 mL via OROMUCOSAL
  Filled 2020-04-18: qty 15

## 2020-04-18 MED ORDER — DEXAMETHASONE SODIUM PHOSPHATE 10 MG/ML IJ SOLN
INTRAMUSCULAR | Status: DC | PRN
  Administered 2020-04-18: 5 mg via INTRAVENOUS

## 2020-04-18 MED ORDER — LIDOCAINE 2% (20 MG/ML) 5 ML SYRINGE
INTRAMUSCULAR | Status: DC | PRN
  Administered 2020-04-18: 60 mg via INTRAVENOUS

## 2020-04-18 MED ORDER — LABETALOL HCL 5 MG/ML IV SOLN
5.0000 mg | Freq: Once | INTRAVENOUS | Status: AC
  Administered 2020-04-18: 5 mg via INTRAVENOUS

## 2020-04-18 MED ORDER — FENTANYL CITRATE (PF) 250 MCG/5ML IJ SOLN
INTRAMUSCULAR | Status: DC | PRN
  Administered 2020-04-18: 100 ug via INTRAVENOUS

## 2020-04-18 MED ORDER — FENTANYL CITRATE (PF) 250 MCG/5ML IJ SOLN
INTRAMUSCULAR | Status: AC
  Filled 2020-04-18: qty 5

## 2020-04-18 MED ORDER — PROPOFOL 10 MG/ML IV BOLUS
INTRAVENOUS | Status: DC | PRN
  Administered 2020-04-18: 120 mg via INTRAVENOUS

## 2020-04-18 MED ORDER — MIDAZOLAM HCL 5 MG/5ML IJ SOLN
INTRAMUSCULAR | Status: DC | PRN
  Administered 2020-04-18: 2 mg via INTRAVENOUS

## 2020-04-18 MED ORDER — SUGAMMADEX SODIUM 200 MG/2ML IV SOLN
INTRAVENOUS | Status: DC | PRN
  Administered 2020-04-18: 1451.2 mg via INTRAVENOUS

## 2020-04-18 MED ORDER — PROPOFOL 10 MG/ML IV BOLUS
INTRAVENOUS | Status: AC
  Filled 2020-04-18: qty 20

## 2020-04-18 MED ORDER — DEXAMETHASONE SODIUM PHOSPHATE 10 MG/ML IJ SOLN
INTRAMUSCULAR | Status: AC
  Filled 2020-04-18: qty 1

## 2020-04-18 MED ORDER — PHENYLEPHRINE 40 MCG/ML (10ML) SYRINGE FOR IV PUSH (FOR BLOOD PRESSURE SUPPORT)
PREFILLED_SYRINGE | INTRAVENOUS | Status: AC
  Filled 2020-04-18: qty 10

## 2020-04-18 MED ORDER — SUGAMMADEX SODIUM 500 MG/5ML IV SOLN
INTRAVENOUS | Status: AC
  Filled 2020-04-18: qty 10

## 2020-04-18 MED ORDER — LACTATED RINGERS IV SOLN: INTRAVENOUS | Status: DC

## 2020-04-18 MED ORDER — ROCURONIUM BROMIDE 10 MG/ML (PF) SYRINGE
PREFILLED_SYRINGE | INTRAVENOUS | Status: AC
  Filled 2020-04-18: qty 20

## 2020-04-18 MED ORDER — ORAL CARE MOUTH RINSE: 15.0000 mL | Freq: Once | OROMUCOSAL | Status: AC

## 2020-04-18 MED ORDER — ROCURONIUM BROMIDE 10 MG/ML (PF) SYRINGE
PREFILLED_SYRINGE | INTRAVENOUS | Status: DC | PRN
  Administered 2020-04-18: 60 mg via INTRAVENOUS

## 2020-04-18 MED ORDER — 0.9 % SODIUM CHLORIDE (POUR BTL) OPTIME
TOPICAL | Status: DC | PRN
  Administered 2020-04-18: 1000 mL

## 2020-04-18 MED ORDER — LABETALOL HCL 5 MG/ML IV SOLN
INTRAVENOUS | Status: AC
  Filled 2020-04-18: qty 4

## 2020-04-18 MED ORDER — CHLORHEXIDINE GLUCONATE 0.12 % MT SOLN: 15.0000 mL | Freq: Once | OROMUCOSAL | Status: AC

## 2020-04-18 MED ORDER — ONDANSETRON HCL 4 MG/2ML IJ SOLN
INTRAMUSCULAR | Status: DC | PRN
  Administered 2020-04-18: 4 mg via INTRAVENOUS

## 2020-04-18 MED ORDER — EPINEPHRINE PF 1 MG/ML IJ SOLN
INTRAMUSCULAR | Status: AC
  Filled 2020-04-18: qty 1

## 2020-04-18 MED ORDER — METOPROLOL SUCCINATE ER 100 MG PO TB24
100.0000 mg | ORAL_TABLET | Freq: Every day | ORAL | Status: DC
  Filled 2020-04-18: qty 1

## 2020-04-18 SURGICAL SUPPLY — 35 items
ADAPTER VALVE BIOPSY EBUS (MISCELLANEOUS) IMPLANT
ADPTR VALVE BIOPSY EBUS (MISCELLANEOUS)
BLADE CLIPPER SURG (BLADE) ×3 IMPLANT
CANISTER SUCT 3000ML PPV (MISCELLANEOUS) ×3 IMPLANT
CNTNR URN SCR LID CUP LEK RST (MISCELLANEOUS) ×1 IMPLANT
CONT SPEC 4OZ STRL OR WHT (MISCELLANEOUS) ×2
COVER BACK TABLE 60X90IN (DRAPES) ×3 IMPLANT
FILTER STRAW FLUID ASPIR (MISCELLANEOUS) IMPLANT
FORCEPS BIOP RJ4 1.8 (CUTTING FORCEPS) IMPLANT
FORCEPS RADIAL JAW LRG 4 PULM (INSTRUMENTS) IMPLANT
GAUZE SPONGE 4X4 12PLY STRL (GAUZE/BANDAGES/DRESSINGS) ×3 IMPLANT
GLOVE SURG SIGNA 7.5 PF LTX (GLOVE) ×3 IMPLANT
GOWN STRL REUS W/ TWL XL LVL3 (GOWN DISPOSABLE) ×1 IMPLANT
GOWN STRL REUS W/TWL XL LVL3 (GOWN DISPOSABLE) ×2
KIT CLEAN ENDO COMPLIANCE (KITS) ×3 IMPLANT
KIT TURNOVER KIT B (KITS) ×3 IMPLANT
MARKER SKIN DUAL TIP RULER LAB (MISCELLANEOUS) ×3 IMPLANT
NS IRRIG 1000ML POUR BTL (IV SOLUTION) ×3 IMPLANT
OIL SILICONE PENTAX (PARTS (SERVICE/REPAIRS)) IMPLANT
PAD ARMBOARD 7.5X6 YLW CONV (MISCELLANEOUS) ×6 IMPLANT
RADIAL JAW LRG 4 PULMONARY (INSTRUMENTS) ×2
STOPCOCK MORSE 400PSI 3WAY (MISCELLANEOUS) ×3 IMPLANT
SYR 10ML LL (SYRINGE) ×3 IMPLANT
SYR 20ML ECCENTRIC (SYRINGE) ×3 IMPLANT
TOWEL GREEN STERILE (TOWEL DISPOSABLE) ×3 IMPLANT
TOWEL GREEN STERILE FF (TOWEL DISPOSABLE) ×3 IMPLANT
TOWEL NATURAL 4PK STERILE (DISPOSABLE) ×3 IMPLANT
TRAP SPECIMEN MUCUS 40CC (MISCELLANEOUS) ×3 IMPLANT
TUBE CONNECTING 20'X1/4 (TUBING) ×1
TUBE CONNECTING 20X1/4 (TUBING) ×2 IMPLANT
UNDERPAD 30X36 HEAVY ABSORB (UNDERPADS AND DIAPERS) ×3 IMPLANT
VALVE BIOPSY  SINGLE USE (MISCELLANEOUS) ×2
VALVE BIOPSY SINGLE USE (MISCELLANEOUS) ×1 IMPLANT
VALVE SUCTION BRONCHIO DISP (MISCELLANEOUS) ×3 IMPLANT
WATER STERILE IRR 1000ML POUR (IV SOLUTION) ×3 IMPLANT

## 2020-04-18 NOTE — Op Note (Signed)
NAMENOWELL, SITES MEDICAL RECORD JI:[id number] ACCOUNT [id number] DATE OF BIRTH:1966-10-22 FACILITY: [facility] LOCATION: [facility] PHYSICIAN:Maddix Heinz C. Elvin Banker, MD  OPERATIVE REPORT  DATE OF PROCEDURE:  04/18/2020  PREOPERATIVE DIAGNOSIS:  Intrabronchial valve placement for prolonged air leak.  POSTOPERATIVE DIAGNOSIS:  Intrabronchial valve placement for prolonged air leak.  PROCEDURE:  Bronchoscopy for removal of intrabronchial valves.  SURGEON:  Charlett Lango, MD  ASSISTANT:  None.  ANESTHESIA:  General.  FINDINGS:  Valves removed intact with no noticeable bleeding.  CLINICAL NOTE:  The patient is a 54 year old gentleman who had undergone coronary artery bypass grafting recently.  In the postoperative period, he had some respiratory insufficiency necessitating noninvasive positive-pressure ventilation.  He developed  a left pneumothorax and had a chest tube placed.  He had a persistent air leak and had undergone intrabronchial valve placement.  His air leak resolved and his chest tube was removed.  He now returns for removal of the intrabronchial valves.  The  indications, risks, benefits and alternatives were discussed in detail with the patient.  He understood and accepted the risks and agreed to proceed.  DESCRIPTION OF PROCEDURE:  The patient was brought to the operating room on 04/18/2020.  He had induction of general anesthesia and was intubated.  A timeout was performed.  Flexible fiberoptic bronchoscopy was performed via the endotracheal tube.  There  was normal endobronchial anatomy with no endobronchial lesions to the level of the subsegmental bronchi.  In the lingular and left upper lobe airways, there were valves present.  The stem of the valve in the lingular bronchus was grasped and the valve  was removed with gentle traction.  There was some thick mucus behind this, but it was nonpurulent in appearance and cleared easily with saline.  The second valve then was  removed in a similar fashion, again with no significant bleeding and secretions  which cleared easily with saline irrigation.  The patient then was extubated in the operating room and taken to the Postanesthetic Care Unit in good condition.  VN/NUANCE  D:04/18/2020 T:04/18/2020 JOB:[id number]

## 2020-04-18 NOTE — Transfer of Care (Signed)
Immediate Anesthesia Transfer of Care Note  Patient: [name]  Procedure(s) Performed: VIDEO BRONCHOSCOPY WITH REMOVAL OF INTERBRONCHIAL VALVE (IBV) (N/A )  Patient Location: PACU  Anesthesia Type:General  Level of Consciousness: awake, alert , oriented, patient cooperative and responds to stimulation  Airway & Oxygen Therapy: Patient Spontanous Breathing and Patient connected to face mask oxygen  Post-op Assessment: Report given to RN, Post -op Vital signs reviewed and stable and Patient moving all extremities X 4  Post vital signs: Reviewed and stable  Last Vitals:  Vitals Value Taken Time  BP 112/83 04/18/20 1340  Temp    Pulse 101 04/18/20 1346  Resp 21 04/18/20 1346  SpO2 92 % 04/18/20 1346  Vitals shown include unvalidated device data.  Last Pain:  Vitals:   04/18/20 1025  TempSrc:   PainSc: 0-No pain      Patients Stated Pain Goal: 5 (04/18/20 1025)  Complications: No apparent anesthesia complications

## 2020-04-18 NOTE — Anesthesia Postprocedure Evaluation (Signed)
Anesthesia Post Note  Patient: Sean Joseph  Procedure(s) Performed: VIDEO BRONCHOSCOPY WITH REMOVAL OF INTERBRONCHIAL VALVE (IBV) (N/A )     Patient location during evaluation: PACU Anesthesia Type: General Level of consciousness: awake and alert Pain management: pain level controlled Vital Signs Assessment: post-procedure vital signs reviewed and stable Respiratory status: spontaneous breathing, nonlabored ventilation, respiratory function stable and patient connected to nasal cannula oxygen Cardiovascular status: blood pressure returned to baseline and stable Postop Assessment: no apparent nausea or vomiting Anesthetic complications: no    Last Vitals:  Vitals:   04/18/20 1430 04/18/20 1445  BP: 95/80 90/79  Pulse: (!) 102 (!) 103  Resp: (!) 32 20  Temp:    SpO2: 93% 91%    Last Pain:  Vitals:   04/18/20 1430  TempSrc:   PainSc: 0-No pain                 Shelton Silvas

## 2020-04-18 NOTE — Brief Op Note (Signed)
04/18/2020  2:03 PM  PATIENT:  Sean Joseph  54 y.o. male  PRE-OPERATIVE DIAGNOSIS:  pneumothorax  POST-OPERATIVE DIAGNOSIS:  pneumothorax  PROCEDURE:  Procedure(s): VIDEO BRONCHOSCOPY WITH REMOVAL OF INTERBRONCHIAL VALVE (IBV) (N/A)  SURGEON:  Surgeon(s) and Role:    * Loreli Slot, MD - Primary  PHYSICIAN ASSISTANT:   ASSISTANTS: none   ANESTHESIA:   general  EBL:  Minimal  BLOOD ADMINISTERED:none  DRAINS: none   LOCAL MEDICATIONS USED:  NONE  SPECIMEN:  No Specimen  DISPOSITION OF SPECIMEN:  N/A  COUNTS:  NO endoscopic  TOURNIQUET:  * No tourniquets in log *  DICTATION: .Other Dictation: Dictation Number -  PLAN OF CARE: Discharge to home after PACU  PATIENT DISPOSITION:  PACU - hemodynamically stable.   Delay start of Pharmacological VTE agent (>24hrs) due to surgical blood loss or risk of bleeding: not applicable

## 2020-04-18 NOTE — Discharge Instructions (Addendum)
Do not drive or engage in heavy physical activity for 24 hours  You may resume normal activities tomorrow  You may cough up small amounts of blood over the next few days.  You may use acetaminophen (Tylenol) if needed for discomfort  Call [phone] if you develop chest pain, shortness of breath or cough up more than a tablespoon of blood  Follow up as scheduled in my office

## 2020-04-18 NOTE — Anesthesia Procedure Notes (Signed)
Procedure Name: Intubation Date/Time: 04/18/2020 1:14 PM Performed by: Glynda Jaeger, CRNA Pre-anesthesia Checklist: Patient identified, Patient being monitored, Timeout performed, Emergency Drugs available and Suction available Patient Re-evaluated:Patient Re-evaluated prior to induction Oxygen Delivery Method: Circle System Utilized Preoxygenation: Pre-oxygenation with 100% oxygen Induction Type: IV induction Laryngoscope Size: Mac and 4 Grade View: Grade I Tube type: Oral Tube size: 8.5 mm Number of attempts: 1 Airway Equipment and Method: Stylet Placement Confirmation: ETT inserted through vocal cords under direct vision,  positive ETCO2 and breath sounds checked- equal and bilateral Secured at: 22 cm Tube secured with: Tape Dental Injury: Teeth and Oropharynx as per pre-operative assessment

## 2020-04-18 NOTE — Interval H&P Note (Signed)
History and Physical Interval Note:  04/18/2020 12:00 PM  Sean Joseph  has presented today for surgery, with the diagnosis of PTX.  The various methods of treatment have been discussed with the patient and family. After consideration of risks, benefits and other options for treatment, the patient has consented to  Procedure(s): VIDEO BRONCHOSCOPY WITH REMOVAL OF INTERBRONCHIAL VALVE (IBV) (N/A) as a surgical intervention.  The patient's history has been reviewed, patient examined, no change in status, stable for surgery.  I have reviewed the patient's chart and labs.  Questions were answered to the patient's satisfaction.     Loreli Slot

## 2020-04-22 LAB — ACID FAST CULTURE WITH REFLEXED SENSITIVITIES (MYCOBACTERIA): Acid Fast Culture: NEGATIVE

## 2020-04-29 ENCOUNTER — Encounter: Payer: Self-pay | Admitting: Cardiology

## 2020-04-29 ENCOUNTER — Ambulatory Visit (INDEPENDENT_AMBULATORY_CARE_PROVIDER_SITE_OTHER): Payer: BC Managed Care – PPO | Admitting: Cardiology

## 2020-04-29 ENCOUNTER — Other Ambulatory Visit: Payer: Self-pay

## 2020-04-29 VITALS — BP 116/70 | HR 88 | Ht 67.0 in | Wt 201.2 lb

## 2020-04-29 DIAGNOSIS — Z720 Tobacco use: Secondary | ICD-10-CM | POA: Diagnosis not present

## 2020-04-29 DIAGNOSIS — Z951 Presence of aortocoronary bypass graft: Secondary | ICD-10-CM | POA: Diagnosis not present

## 2020-04-29 DIAGNOSIS — I214 Non-ST elevation (NSTEMI) myocardial infarction: Secondary | ICD-10-CM | POA: Diagnosis not present

## 2020-04-29 DIAGNOSIS — I2510 Atherosclerotic heart disease of native coronary artery without angina pectoris: Secondary | ICD-10-CM

## 2020-04-29 HISTORY — DX: Atherosclerotic heart disease of native coronary artery without angina pectoris: I25.10

## 2020-04-29 LAB — LIPID PANEL
Chol/HDL Ratio: 4 ratio (ref 0.0–5.0)
Cholesterol, Total: 117 mg/dL (ref 100–199)
HDL: 29 mg/dL — ABNORMAL LOW (ref >39)
LDL Chol Calc (NIH): 54 mg/dL (ref 0–99)
Triglycerides: 211 mg/dL — ABNORMAL HIGH (ref 0–149)
VLDL Cholesterol Cal: 34 mg/dL (ref 5–40)

## 2020-04-29 LAB — HEPATIC FUNCTION PANEL
ALT: 32 [IU]/L (ref 0–44)
AST: 28 [IU]/L (ref 0–40)
Albumin: 4.5 g/dL (ref 3.8–4.9)
Alkaline Phosphatase: 91 [IU]/L (ref 48–121)
Bilirubin Total: 0.4 mg/dL (ref 0.0–1.2)
Bilirubin, Direct: 0.13 mg/dL (ref 0.00–0.40)
Total Protein: 7.3 g/dL (ref 6.0–8.5)

## 2020-04-29 NOTE — Addendum Note (Signed)
Addended by: Vernard Gambles on: 04/29/2020 09:33 AM   Modules accepted: Orders

## 2020-04-29 NOTE — Progress Notes (Signed)
Cardiology Office Note:    Date:  04/29/2020   ID:  Sean Joseph, DOB 07-Apr-1966, MRN [id number]  PCP:  Rochel Brome, MD  Cardiologist:  Jenean Lindau, MD   Referring MD: Rochel Brome, MD    ASSESSMENT:    1. Non-ST elevation (NSTEMI) myocardial infarction ([place])   2. Hx of CABG   3. Tobacco abuse   4. Coronary artery disease involving native coronary artery of native heart without angina pectoris    PLAN:    In order of problems listed above:  1. Coronary artery disease: Secondary prevention stressed with the patient.  Importance of compliance with diet medication stressed and he vocalized understanding.  Importance of regular exercise stressed and he promises to comply. 2. Essential hypertension: Blood pressure is stable 3. Mixed dyslipidemia: Diet was emphasized.  He will have blood work today as he is fasting.  Level of lipid check.  Recently Chem-7 and CBC was done. 4. Cigarette smoker: I spent 5 minutes with the patient discussing solely about smoking. Smoking cessation was counseled. I suggested to the patient also different medications and pharmacological interventions. Patient is keen to try stopping on its own at this time. He will get back to me if he needs any further assistance in this matter. 5. Patient will be seen in follow-up appointment in 3 months or earlier if the patient has any concerns    Medication Adjustments/Labs and Tests Ordered: Current medicines are reviewed at length with the patient today.  Concerns regarding medicines are outlined above.  No orders of the defined types were placed in this encounter.  No orders of the defined types were placed in this encounter.    No chief complaint on file.    History of Present Illness:    Sean Joseph is a 54 y.o. male.  Patient has past medical history of COPD and continues to smoke.  He was admitted to the hospital and underwent coronary angiography with multivessel disease.  The details of  coronary angiography are given below.  Patient underwent CABG surgery and subsequently it appears that he had a pneumothorax and also had a chest tube for some time and was discharged home.  Subsequently the chest tube was taken off and he is doing fine.  No chest pain orthopnea or PND.  He is walking some on a regular basis.  Unfortunately continues to smoke.  At the time of my evaluation, the patient is alert awake oriented and in no distress.  Past Medical History:  Diagnosis Date  . Anginal pain ([place])   . COPD (chronic obstructive pulmonary disease) ([place])   . Coronary artery disease   . GERD (gastroesophageal reflux disease)   . Hypertension   . Myocardial infarction [facility])     Past Surgical History:  Procedure Laterality Date  . CORONARY ARTERY BYPASS GRAFT N/A 03/01/2020   Procedure: CORONARY ARTERY BYPASS GRAFTING (CABG) X 3, USING BILATERAL MAMMORY ARTERIES, RIGHT LEG GREATER SAPHENOUS VEIN HARVESTED.;  Surgeon: Melrose Nakayama, MD;  Location: [place];  Service: Open Heart Surgery;  Laterality: N/A;  . HERNIA REPAIR    . LEFT HEART CATH AND CORONARY ANGIOGRAPHY N/A 02/29/2020   Procedure: LEFT HEART CATH AND CORONARY ANGIOGRAPHY;  Surgeon: Belva Crome, MD;  Location: [place] [facility];  Service: Cardiovascular;  Laterality: N/A;  . TEE WITHOUT CARDIOVERSION N/A 03/01/2020   Procedure: TRANSESOPHAGEAL ECHOCARDIOGRAM (TEE);  Surgeon: Melrose Nakayama, MD;  Location: [place];  Service: Open Heart Surgery;  Laterality:  N/A;  . VIDEO BRONCHOSCOPY WITH INSERTION OF INTERBRONCHIAL VALVE (IBV) N/A 03/08/2020   Procedure: VIDEO BRONCHOSCOPY WITH INSERTION OF INTERBRONCHIAL VALVE (IBV);  Surgeon: Loreli Slot, MD;  Location: [facility] OR;  Service: Thoracic;  Laterality: N/A;  . VIDEO BRONCHOSCOPY WITH INSERTION OF INTERBRONCHIAL VALVE (IBV) N/A 04/18/2020   Procedure: VIDEO BRONCHOSCOPY WITH REMOVAL OF INTERBRONCHIAL VALVE (IBV);  Surgeon: Loreli Slot, MD;  Location[facility] OR;   Service: Thoracic;  Laterality: N/A;    Current Medications: Current Meds  Medication Sig  . albuterol (VENTOLIN HFA) 108 (90 Base) MCG/ACT inhaler Inhale 1-2 puffs into the lungs every 4 (four) hours as needed for wheezing or shortness of breath.  Marland Kitchen aspirin 81 MG EC tablet Take 1 tablet (81 mg total) by mouth daily.  Marland Kitchen atorvastatin (LIPITOR) 80 MG tablet Take 1 tablet (80 mg total) by mouth daily at 6 PM.  . clopidogrel (PLAVIX) 75 MG tablet Take 1 tablet (75 mg total) by mouth daily.  . fexofenadine-pseudoephedrine (ALLEGRA-D 24) 180-240 MG 24 hr tablet Take 1 tablet by mouth daily.  . Fluticasone-Umeclidin-Vilant (TRELEGY ELLIPTA) 100-62.5-25 MCG/INH AEPB Inhale 1 puff into the lungs daily as needed (shortness of breat).   . metoprolol succinate (TOPROL-XL) 100 MG 24 hr tablet Take 1 tablet (100 mg total) by mouth daily. Take with or immediately following a meal. (Patient taking differently: Take 100 mg by mouth in the morning. Take with or immediately following a meal.)  . montelukast (SINGULAIR) 10 MG tablet Take 1 tablet (10 mg total) by mouth at bedtime. (Patient taking differently: Take 10 mg by mouth daily. )  . pantoprazole (PROTONIX) 40 MG tablet Take 1 tablet (40 mg total) by mouth daily.     Allergies:   Codeine   Social History   Socioeconomic History  . Marital status: Married    Spouse name: Not on file  . Number of children: Not on file  . Years of education: Not on file  . Highest education level: Not on file  Occupational History  . Not on file  Tobacco Use  . Smoking status: Former Games developer  . Smokeless tobacco: Never Used  Vaping Use  . Vaping Use: Never used  Substance and Sexual Activity  . Alcohol use: Yes    Comment: socially  . Drug use: Never  . Sexual activity: Not on file  Other Topics Concern  . Not on file  Social History Narrative  . Not on file   Social Determinants of Health   Financial Resource Strain:   . Difficulty of Paying Living  Expenses:   Food Insecurity:   . Worried About Programme researcher, broadcasting/film/video in the Last Year:   . Barista in the Last Year:   Transportation Needs:   . Freight forwarder (Medical):   Marland Kitchen Lack of Transportation (Non-Medical):   Physical Activity:   . Days of Exercise per Week:   . Minutes of Exercise per Session:   Stress:   . Feeling of Stress :   Social Connections:   . Frequency of Communication with Friends and Family:   . Frequency of Social Gatherings with Friends and Family:   . Attends Religious Services:   . Active Member of Clubs or Organizations:   . Attends Banker Meetings:   Marland Kitchen Marital Status:      Family History: The patient's family history includes CAD in his father and mother.  ROS:   Please see the history of present  illness.    All other systems reviewed and are negative.  EKGs/Labs/Other Studies Reviewed:    The following studies were reviewed today: Lyn Records, MD (Primary)    Procedures  LEFT HEART CATH AND CORONARY ANGIOGRAPHY  Conclusion   Apical severe hypokinesis.  EF 40%.  LVEDP 10 mmHg.  Normal left main  Anatomically small LAD that stopped short of the left ventricular apex.  The mid segment is totally occluded after the origin of a large septal perforator.  Relatively long segment of total occlusion.  A small to moderate sized LAD fills by left to left collaterals.  Codominant circumflex with first obtuse marginal containing 60 and 70% stenosis proximal and distal.  RCA is codominant.  Proximal segmental 80% stenosis mid 60% stenosis and towards the distal portion of the mid segment there is a 95% stenosis.  The PDA runs the entire length of the posterior interventricular groove and wraps around the left ventricular apex.  RECOMMENDATIONS:   Debate treatment strategy. A potential option is extensive stenting in the proximal to distal RCA and treat LAD and obtuse marginal disease with medical therapy.  An alternative  approach would be to consider arterial grafting of LAD if technically possible as well as arterial grafting of the right coronary with medical therapy of the obtuse marginal.  Given the patient's young age, it would be worth having surgical consultation prior to considering intervention.  The interventional team will need to weigh in relative to final treatment decision.    Recent Labs: 02/29/2020: TSH 1.549 03/02/2020: Magnesium 2.5 04/16/2020: ALT 28; BUN 6; Creatinine, Ser 0.73; Hemoglobin 16.2; Platelets 272; Potassium 3.7; Sodium 141  Recent Lipid Panel    Component Value Date/Time   CHOL 222 (H) 02/29/2020 0659   TRIG 231 (H) 02/29/2020 0659   HDL 32 (L) 02/29/2020 0659   CHOLHDL 6.9 02/29/2020 0659   VLDL 46 (H) 02/29/2020 0659   LDLCALC 144 (H) 02/29/2020 0659    Physical Exam:    VS:  BP 116/70   Pulse 88   Ht 5\' 7"  (1.702 m)   Wt 201 lb 3.2 oz (91.3 kg)   SpO2 95%   BMI 31.51 kg/m     Wt Readings from Last 3 Encounters:  04/29/20 201 lb 3.2 oz (91.3 kg)  04/18/20 200 lb (90.7 kg)  04/16/20 201 lb 4.8 oz (91.3 kg)     GEN: Patient is in no acute distress HEENT: Normal NECK: No JVD; No carotid bruits LYMPHATICS: No lymphadenopathy CARDIAC: Hear sounds regular, 2/6 systolic murmur at the apex. RESPIRATORY:  Clear to auscultation without rales, wheezing or rhonchi  ABDOMEN: Soft, non-tender, non-distended MUSCULOSKELETAL:  No edema; No deformity  SKIN: Warm and dry NEUROLOGIC:  Alert and oriented x 3 PSYCHIATRIC:  Normal affect   Signed, 06/16/20, MD  04/29/2020 9:20 AM     [facility]

## 2020-04-29 NOTE — Patient Instructions (Signed)
Medication Instructions:  Your physician recommends that you continue on your current medications as directed. Please refer to the Current Medication list given to you today.  *If you need a refill on your cardiac medications before your next appointment, please call your pharmacy*   Lab Work: Lipids & LFT's- Today   If you have labs (blood work) drawn today and your tests are completely normal, you will receive your results only by:  MyChart Message (if you have MyChart) OR  A paper copy in the mail If you have any lab test that is abnormal or we need to change your treatment, we will call you to review the results.   Testing/Procedures: None ordered    Follow-Up: At [facility], you and your health needs are our priority.  As part of our continuing mission to provide you with exceptional heart care, we have created designated Provider Care Teams.  These Care Teams include your primary Cardiologist (physician) and Advanced Practice Providers (APPs -  Physician Assistants and Nurse Practitioners) who all work together to provide you with the care you need, when you need it.  We recommend signing up for the patient portal called "MyChart".  Sign up information is provided on this After Visit Summary.  MyChart is used to connect with patients for Virtual Visits (Telemedicine).  Patients are able to view lab/test results, encounter notes, upcoming appointments, etc.  Non-urgent messages can be sent to your provider as well.   To learn more about what you can do with MyChart, go to [email or web address].    Your next appointment:   3 month(s)  The format for your next appointment:   In Person  Provider:   Belva Crome, MD   Other Instructions None

## 2020-05-06 ENCOUNTER — Other Ambulatory Visit: Payer: Self-pay | Admitting: Thoracic Surgery (Cardiothoracic Vascular Surgery)

## 2020-05-06 DIAGNOSIS — Z951 Presence of aortocoronary bypass graft: Secondary | ICD-10-CM

## 2020-05-06 NOTE — Addendum Note (Signed)
Addended by: Eleonore Chiquito on: 05/06/2020 01:34 PM   Modules accepted: Orders

## 2020-05-07 ENCOUNTER — Ambulatory Visit (INDEPENDENT_AMBULATORY_CARE_PROVIDER_SITE_OTHER): Payer: Self-pay | Admitting: Thoracic Surgery (Cardiothoracic Vascular Surgery)

## 2020-05-07 ENCOUNTER — Other Ambulatory Visit: Payer: Self-pay | Admitting: *Deleted

## 2020-05-07 ENCOUNTER — Ambulatory Visit
Admission: RE | Admit: 2020-05-07 | Discharge: 2020-05-07 | Disposition: A | Discharge status: Home / Self Care | Payer: BC Managed Care – PPO | Source: Ambulatory Visit | Attending: Thoracic Surgery (Cardiothoracic Vascular Surgery) | Admitting: Thoracic Surgery (Cardiothoracic Vascular Surgery)

## 2020-05-07 ENCOUNTER — Other Ambulatory Visit: Payer: Self-pay

## 2020-05-07 VITALS — BP 123/85 | HR 95 | Temp 97.8°F | Resp 20 | Ht 67.0 in | Wt 202.0 lb

## 2020-05-07 DIAGNOSIS — Z951 Presence of aortocoronary bypass graft: Secondary | ICD-10-CM

## 2020-05-07 DIAGNOSIS — J984 Other disorders of lung: Secondary | ICD-10-CM

## 2020-05-07 DIAGNOSIS — Z09 Encounter for follow-up examination after completed treatment for conditions other than malignant neoplasm: Secondary | ICD-10-CM

## 2020-05-07 DIAGNOSIS — I2510 Atherosclerotic heart disease of native coronary artery without angina pectoris: Secondary | ICD-10-CM

## 2020-05-07 DIAGNOSIS — R918 Other nonspecific abnormal finding of lung field: Secondary | ICD-10-CM

## 2020-05-07 NOTE — Progress Notes (Signed)
[address]       [place] [address]             [phone]       HPI: Mr. Sean Joseph returns for scheduled follow-up visit  Sean Joseph is a 54 year old man with history of reflux, hypertriglyceridemia, tobacco abuse, bullous emphysema, and coronary disease.  He presented with non-ST elevation MI.  He was found to have severe three-vessel disease.  I did coronary artery bypass grafting x3 on 03/01/2020.  We use bilateral mammaries.  Postoperatively he had some respiratory failure requiring BiPAP and developed an air leak.  He had a persistent leak so we ultimately placed intrabronchial valves on 03/08/2020.  His air leak resolved.  I did bronchoscopy for removal of his IBV on 04/18/2020.  He did not have any issues with that procedure.  He feels well.  He does still have some incisional soreness.  He feels like his breathing is gradually improving.  Past Medical History:  Diagnosis Date  . Anginal pain ([place])   . COPD (chronic obstructive pulmonary disease) ([place])   . Coronary artery disease   . GERD (gastroesophageal reflux disease)   . Hypertension   . Myocardial infarction [facility])     Current Outpatient Medications  Medication Sig Dispense Refill  . albuterol (VENTOLIN HFA) 108 (90 Base) MCG/ACT inhaler Inhale 1-2 puffs into the lungs every 4 (four) hours as needed for wheezing or shortness of breath.    Marland Kitchen aspirin 81 MG EC tablet Take 1 tablet (81 mg total) by mouth daily.    Marland Kitchen atorvastatin (LIPITOR) 80 MG tablet Take 1 tablet (80 mg total) by mouth daily at 6 PM. 30 tablet 2  . clopidogrel (PLAVIX) 75 MG tablet Take 1 tablet (75 mg total) by mouth daily. 90 tablet 3  . fexofenadine-pseudoephedrine (ALLEGRA-D 24) 180-240 MG 24 hr tablet Take 1 tablet by mouth daily.    . Fluticasone-Umeclidin-Vilant (TRELEGY ELLIPTA) 100-62.5-25 MCG/INH AEPB Inhale 1 puff into the lungs daily as needed (shortness of breat).     . metoprolol succinate (TOPROL-XL) 100 MG 24 hr  tablet Take 1 tablet (100 mg total) by mouth daily. Take with or immediately following a meal. (Patient taking differently: Take 100 mg by mouth in the morning. Take with or immediately following a meal.) 90 tablet 3  . montelukast (SINGULAIR) 10 MG tablet Take 1 tablet (10 mg total) by mouth at bedtime. (Patient taking differently: Take 10 mg by mouth daily. ) 90 tablet 0  . pantoprazole (PROTONIX) 40 MG tablet Take 1 tablet (40 mg total) by mouth daily. 30 tablet 2   No current facility-administered medications for this visit.    Physical Exam BP 123/85   Pulse 95   Temp 97.8 F (36.6 C) (Skin)   Resp 20   Ht 5\' 7"  (1.702 m)   Wt 202 lb (91.6 kg)   SpO2 95% Comment: RA  BMI 31.71 kg/m  54 year old man in no acute distress Alert and oriented x3 with no focal deficits Lungs diminished breath sounds bilaterally, no wheezing Sternum stable, incision healing well Cardiac regular rate and rhythm  Diagnostic Tests: CHEST - 2 VIEW  COMPARISON:  04/18/2020.  04/16/2020.  04/02/2020.  03/26/2020.  FINDINGS: Prior CABG. Heart size normal. Persistent right base pleuroparenchymal thickening consistent with atelectasis/scarring. No interim change from multiple prior exams. Persistent nodular density noted over the left lung base. As noted on prior chest x-ray report CT should be considered to  further evaluate. No pleural effusion or pneumothorax.  IMPRESSION: 1. Persistent right base pleuroparenchymal thickening consistent with scarring. No change from multiple prior exams.  2. Persistent nodular density noted the left lung base. As noted on prior chest x-ray report CT should be considered to further evaluate.   Electronically Signed   By: Maisie Fus  Register   On: 05/07/2020 09:48 I personally reviewed the chest x-ray images and concur with the findings noted above. I reviewed his preoperative chest x-ray from 02/29/2020.  There was no sign of a left lower lobe nodule on that  film.  Impression: Sean Joseph is a 54 year old gentleman who presented with non-ST elevation MI and underwent coronary bypass grafting on 03/01/2020.  His postoperative course was complicated by respiratory failure requiring BiPAP and a pneumothorax with a prolonged air leak.  We removed the IBV a couple of weeks ago and he has not had any issues since then.  He has not smoked since he had surgery.  From a surgical standpoint he is doing well.  At this point there are no restrictions on his activities, but he was cautioned to build into activities gradually.  He works in Loss adjuster, chartered, so I think he is at least another month before he is ready to return to work.  We have tentatively set a date of July 19.  I will see him back before that just to be sure.  There is a question of a left lower lobe lung nodule on his chest x-ray.  Looking back his films from April, it was not apparent at that time.  Suspect this is an area of rounded atelectasis postop.  Given his smoking history we will plan to do a CT but I am going to wait about another month to see if that will clear before doing so.  Plan: Return in 1 month with CT chest  Loreli Slot, MD [facility] [phone]

## 2020-05-08 ENCOUNTER — Telehealth: Payer: Self-pay

## 2020-05-08 NOTE — Telephone Encounter (Signed)
Updated Attending Physician's Statement faxed to [employer] @ [phone]. RTW date will be 06/03/2020. See OV note from 05/07/20- Dr Dorris Fetch

## 2020-05-08 NOTE — Progress Notes (Signed)
Sean Joseph Is a young man who saw Andi and has been in and out of the hospital several times. Could you please reach out to him and ask him to follow up with [place] after he is discharged or schedule him if he is already discharged with Kennon Rounds or Dr. Marina Goodell. Thanks, Dr. Sedalia Muta

## 2020-05-11 ENCOUNTER — Other Ambulatory Visit: Payer: Self-pay | Admitting: Family Medicine

## 2020-05-28 ENCOUNTER — Other Ambulatory Visit: Payer: Self-pay

## 2020-05-28 ENCOUNTER — Ambulatory Visit (INDEPENDENT_AMBULATORY_CARE_PROVIDER_SITE_OTHER): Payer: Self-pay | Admitting: Thoracic Surgery (Cardiothoracic Vascular Surgery)

## 2020-05-28 ENCOUNTER — Encounter: Payer: Self-pay | Admitting: Thoracic Surgery (Cardiothoracic Vascular Surgery)

## 2020-05-28 ENCOUNTER — Ambulatory Visit
Admission: RE | Admit: 2020-05-28 | Discharge: 2020-05-28 | Disposition: A | Discharge status: Home / Self Care | Payer: BC Managed Care – PPO | Source: Ambulatory Visit | Attending: Thoracic Surgery (Cardiothoracic Vascular Surgery) | Admitting: Thoracic Surgery (Cardiothoracic Vascular Surgery)

## 2020-05-28 VITALS — BP 130/90 | HR 88 | Temp 97.9°F | Resp 20 | Ht 67.0 in | Wt 207.0 lb

## 2020-05-28 DIAGNOSIS — R911 Solitary pulmonary nodule: Secondary | ICD-10-CM

## 2020-05-28 DIAGNOSIS — R918 Other nonspecific abnormal finding of lung field: Secondary | ICD-10-CM

## 2020-05-28 DIAGNOSIS — J984 Other disorders of lung: Secondary | ICD-10-CM

## 2020-05-28 DIAGNOSIS — Z951 Presence of aortocoronary bypass graft: Secondary | ICD-10-CM

## 2020-05-28 NOTE — Progress Notes (Unsigned)
      [address]       [name] [address]             [phone]      May 28, 2020  To whom it may concern  Mr. Sean Joseph has been under my care since 03/01/2020.  He has been unable to work due to health issues.  He is now cleared to return to work.  Sincerely,  Salvatore Decent. Dorris Fetch, MD

## 2020-05-28 NOTE — Progress Notes (Signed)
[address]       Sean Joseph [address]             [phone]      HPI: Sean Joseph returns for scheduled follow-up visit  Sean Joseph is a 54 year old man with a history of tobacco abuse, reflux, hypertriglyceridemia, bullous emphysema, and coronary artery disease.  He presented back in April with a non-ST elevation MI.  He was found to have severe three-vessel coronary disease.  I did coronary artery bypass grafting x3 on 03/01/2020.  His postoperative course was complicated by respiratory failure and a pneumothorax.  He had a prolonged air leak and required IBV placement on 03/08/2020.  I removed those on 04/18/2020.  He still has a little incisional soreness.  He is still smoking.  He says he has been trying to quit.  He has not had any recurrent angina.  His last chest x-ray there was a question of a lung nodule on the left.  He had a CT scan to further evaluate that.  Past Medical History:  Diagnosis Date  . Anginal pain (HCC)   . COPD (chronic obstructive pulmonary disease) (HCC)   . Coronary artery disease   . GERD (gastroesophageal reflux disease)   . Hypertension   . Myocardial infarction [facility])     Current Outpatient Medications  Medication Sig Dispense Refill  . albuterol (VENTOLIN HFA) 108 (90 Base) MCG/ACT inhaler Inhale 1-2 puffs into the lungs every 4 (four) hours as needed for wheezing or shortness of breath.    Marland Kitchen aspirin 81 MG EC tablet Take 1 tablet (81 mg total) by mouth daily.    Marland Kitchen atorvastatin (LIPITOR) 80 MG tablet Take 1 tablet (80 mg total) by mouth daily at 6 PM. 30 tablet 2  . clopidogrel (PLAVIX) 75 MG tablet Take 1 tablet (75 mg total) by mouth daily. 90 tablet 3  . fexofenadine-pseudoephedrine (ALLEGRA-D 24) 180-240 MG 24 hr tablet Take 1 tablet by mouth daily.    . Fluticasone-Umeclidin-Vilant (TRELEGY ELLIPTA) 100-62.5-25 MCG/INH AEPB Inhale 1 puff into the lungs daily as needed (shortness of breat).     . metoprolol succinate  (TOPROL-XL) 100 MG 24 hr tablet Take 1 tablet (100 mg total) by mouth daily. Take with or immediately following a meal. (Patient taking differently: Take 100 mg by mouth in the morning. Take with or immediately following a meal.) 90 tablet 3  . montelukast (SINGULAIR) 10 MG tablet TAKE 1 TABLET(10 MG) BY MOUTH AT BEDTIME 90 tablet 0  . pantoprazole (PROTONIX) 40 MG tablet Take 1 tablet (40 mg total) by mouth daily. 30 tablet 2   No current facility-administered medications for this visit.    Physical Exam BP 130/90   Pulse 88   Temp 97.9 F (36.6 C) (Skin)   Resp 20   Ht 5\' 7"  (1.702 m)   Wt 207 lb (93.9 kg)   SpO2 94% Comment: RA  BMI 32.69 kg/m  54 year old man in no acute distress Alert and oriented x3 with no focal deficits Lungs clear bilaterally Sternum stable, incision well-healed Cardiac regular rate and rhythm  Diagnostic Tests: CT CHEST WITHOUT CONTRAST  TECHNIQUE: Multidetector CT imaging of the chest was performed following the standard protocol without IV contrast.  COMPARISON:  Numerous recent chest films. No prior chest CTs.  FINDINGS: Cardiovascular: The heart is normal in size. No pericardial effusion. The aorta is normal in caliber. Scattered atherosclerotic calcifications. Surgical changes from coronary artery bypass surgery. Scattered  coronary artery calcifications.  Mediastinum/Nodes: No mediastinal or hilar mass or adenopathy. The esophagus is grossly normal. Postoperative scarring type changes noted in the anterior mediastinum.  Lungs/Pleura: Significant emphysematous changes with paraseptal and centrilobar emphysema. Large bulla noted in the right upper lobe medially with some surrounding scarring changes. Dense lingular bandlike scarring changes accounting for the chest x-ray abnormality. No worrisome pulmonary lesions or pulmonary nodules. No infiltrates or effusions.  Upper Abdomen: No significant upper abdominal  findings.  Musculoskeletal: Surgical changes from open heart surgery with median sternotomy wires. The sternotomy is ununited. The edges are smooth and well corticated.  The thoracic vertebral bodies are normally aligned. No significant bony findings. No chest wall mass, supraclavicular or axillary adenopathy. The thyroid gland is unremarkable.  IMPRESSION: 1. Significant emphysematous changes with paraseptal and centrilobar emphysema. 2. Large bulla in the right upper lobe medially with some surrounding scarring changes. 3. Dense lingular bandlike scarring changes accounting for the chest x-ray abnormality. 4. No worrisome pulmonary lesions or pulmonary nodules. 5. No mediastinal or hilar mass or adenopathy. 6. Surgical changes from open heart surgery with median sternotomy wires. The sternotomy is ununited. 7. Emphysema and aortic atherosclerosis.  Aortic Atherosclerosis (ICD10-I70.0) and Emphysema (ICD10-J43.9).  Aortic Atherosclerosis (ICD10-I70.0) and Emphysema (ICD10-J43.9).   Electronically Signed   By: Rudie Meyer M.D.   On: 05/28/2020 11:14  I personally reviewed the CT images and concur with the findings noted above.  Impression: Sean Joseph is a 54 year old man with a history of tobacco abuse, COPD, bullous emphysema, hypertriglyceridemia, and reflux.  He underwent coronary bypass grafting x3 back in April.  That was complicated by respiratory failure and pneumothorax with a prolonged air leak.  He had IBVs placed in April and then subsequently removed in June.  There was a question of a lung nodule on a chest x-ray from a month ago.  His CT shows no evidence of a lung nodule but he does have severe bullous emphysema primarily in the upper lobes bilaterally.   Tobacco abuse -importance of smoking cessation was emphasized.  He needs to be screened for lung cancer annually with a low-dose CT.  I will arrange to see him back in a year with that.  No  restrictions on his activities at this point.  He is okay to return to work.  He plans to do so on Monday.  Plan: Okay to return to work on 06/03/2020 Quit smoking Return in 1 year with low-dose CT for lung cancer screening  Loreli Slot, MD [facility] [phone]

## 2020-06-06 ENCOUNTER — Other Ambulatory Visit: Payer: Self-pay | Admitting: Surgical

## 2020-06-11 ENCOUNTER — Other Ambulatory Visit: Payer: Self-pay | Admitting: Cardiology

## 2020-06-11 MED ORDER — ATORVASTATIN CALCIUM 80 MG PO TABS: 80.0000 mg | ORAL_TABLET | Freq: Every day | ORAL | 3 refills | Status: DC

## 2020-06-11 NOTE — Telephone Encounter (Signed)
Refill sent in per request.  

## 2020-06-11 NOTE — Telephone Encounter (Signed)
°*  STAT* If patient is at the pharmacy, call can be transferred to refill team.   1. Which medications need to be refilled? (please list name of each medication and dose if known)? atorvastatin (LIPITOR) 80 MG tablet  2. Which pharmacy/location (including street and city if local pharmacy) is medication to be sent to? [facility] [phone] - [place], [place] - [address]  3. Do they need a 30 day or 90 day supply? 90 day supply

## 2020-07-23 ENCOUNTER — Encounter: Payer: Self-pay | Admitting: Cardiology

## 2020-07-23 ENCOUNTER — Ambulatory Visit: Payer: BC Managed Care – PPO | Admitting: Cardiology

## 2020-07-23 ENCOUNTER — Other Ambulatory Visit: Payer: Self-pay

## 2020-07-23 DIAGNOSIS — I10 Essential (primary) hypertension: Secondary | ICD-10-CM

## 2020-07-23 DIAGNOSIS — E782 Mixed hyperlipidemia: Secondary | ICD-10-CM

## 2020-07-23 DIAGNOSIS — Z720 Tobacco use: Secondary | ICD-10-CM

## 2020-07-23 DIAGNOSIS — Z951 Presence of aortocoronary bypass graft: Secondary | ICD-10-CM | POA: Diagnosis not present

## 2020-07-23 DIAGNOSIS — I2510 Atherosclerotic heart disease of native coronary artery without angina pectoris: Secondary | ICD-10-CM | POA: Diagnosis not present

## 2020-07-23 HISTORY — DX: Essential (primary) hypertension: I10

## 2020-07-23 HISTORY — DX: Mixed hyperlipidemia: E78.2

## 2020-07-23 NOTE — Progress Notes (Signed)
Cardiology Office Note:    Date:  07/23/2020   ID:  [name], DOB [birth year], MRN [id number]  PCP:  Blane Ohara, MD  Cardiologist:  Garwin Brothers, MD   Referring MD: Blane Ohara, MD    ASSESSMENT:    1. Coronary artery disease involving native coronary artery of native heart without angina pectoris   2. Hx of CABG   3. Mixed dyslipidemia   4. Essential hypertension   5. Tobacco abuse    PLAN:    In order of problems listed above:  1. Coronary artery disease: Secondary prevention stressed with the patient.  Importance of compliance with diet medication stressed any vocalized understanding.  Importance of regular exercise stressed and I told him to walk at least half an hour a day and he promises to do so. 2. Essential hypertension: Blood pressure is stable 3. Mixed dyslipidemia: Triglycerides are elevated.  Patient will continue statins.  Diet was discussed at length.  He will take fish oil 2 g a day and be back in 3 months for Chem-7 liver lipid check. 4. Cigarette smoker: I spent 5 minutes with the patient discussing solely about smoking. Smoking cessation was counseled. I suggested to the patient also different medications and pharmacological interventions. Patient is keen to try stopping on its own at this time. He will get back to me if he needs any further assistance in this matter. 5. Patient will be seen in follow-up appointment in 6 months or earlier if the patient has any concerns    Medication Adjustments/Labs and Tests Ordered: Current medicines are reviewed at length with the patient today.  Concerns regarding medicines are outlined above.  No orders of the defined types were placed in this encounter.  No orders of the defined types were placed in this encounter.    No chief complaint on file.    History of Present Illness:    Sean Joseph is a 54 y.o. male.  Patient has past medical history of coronary artery disease post CABG, essential  hypertension dyslipidemia and is an active smoker.  He has been stable since his bypass surgery.  He has a lung nodule which is followed by our cardiothoracic colleagues.  He denies any chest pain orthopnea or PND.  He works 10 hours a day without any symptoms.  At the time of my evaluation, the patient is alert awake oriented and in no distress.  Past Medical History:  Diagnosis Date  . Anginal pain (HCC)   . COPD (chronic obstructive pulmonary disease) (HCC)   . Coronary artery disease   . GERD (gastroesophageal reflux disease)   . Hypertension   . Myocardial infarction [facility])     Past Surgical History:  Procedure Laterality Date  . CORONARY ARTERY BYPASS GRAFT N/A 03/01/2020   Procedure: CORONARY ARTERY BYPASS GRAFTING (CABG) X 3, USING BILATERAL MAMMORY ARTERIES, RIGHT LEG GREATER SAPHENOUS VEIN HARVESTED.;  Surgeon: Loreli Slot, MD;  Location: [facility] OR;  Service: Open Heart Surgery;  Laterality: N/A;  . HERNIA REPAIR    . LEFT HEART CATH AND CORONARY ANGIOGRAPHY N/A 02/29/2020   Procedure: LEFT HEART CATH AND CORONARY ANGIOGRAPHY;  Surgeon: Lyn Records, MD;  Location: [facility] INVASIVE CV LAB;  Service: Cardiovascular;  Laterality: N/A;  . TEE WITHOUT CARDIOVERSION N/A 03/01/2020   Procedure: TRANSESOPHAGEAL ECHOCARDIOGRAM (TEE);  Surgeon: Loreli Slot, MD;  Location: [facility] OR;  Service: Open Heart Surgery;  Laterality: N/A;  . VIDEO BRONCHOSCOPY WITH INSERTION OF INTERBRONCHIAL VALVE (IBV) N/A  03/08/2020   Procedure: VIDEO BRONCHOSCOPY WITH INSERTION OF INTERBRONCHIAL VALVE (IBV);  Surgeon: Loreli Slot, MD;  Location: [facility] OR;  Service: Thoracic;  Laterality: N/A;  . VIDEO BRONCHOSCOPY WITH INSERTION OF INTERBRONCHIAL VALVE (IBV) N/A 04/18/2020   Procedure: VIDEO BRONCHOSCOPY WITH REMOVAL OF INTERBRONCHIAL VALVE (IBV);  Surgeon: Loreli Slot, MD;  Location: [facility] OR;  Service: Thoracic;  Laterality: N/A;    Current Medications: Current Meds  Medication Sig  .  albuterol (VENTOLIN HFA) 108 (90 Base) MCG/ACT inhaler Inhale 1-2 puffs into the lungs every 4 (four) hours as needed for wheezing or shortness of breath.  Marland Kitchen aspirin 81 MG EC tablet Take 1 tablet (81 mg total) by mouth daily.  Marland Kitchen atorvastatin (LIPITOR) 80 MG tablet Take 1 tablet (80 mg total) by mouth daily at 6 PM.  . clopidogrel (PLAVIX) 75 MG tablet Take 1 tablet (75 mg total) by mouth daily.  . fexofenadine-pseudoephedrine (ALLEGRA-D 24) 180-240 MG 24 hr tablet Take 1 tablet by mouth daily.  . Fluticasone-Umeclidin-Vilant (TRELEGY ELLIPTA) 100-62.5-25 MCG/INH AEPB Inhale 1 puff into the lungs daily as needed (shortness of breat).   . montelukast (SINGULAIR) 10 MG tablet TAKE 1 TABLET(10 MG) BY MOUTH AT BEDTIME  . pantoprazole (PROTONIX) 40 MG tablet Take 1 tablet (40 mg total) by mouth daily.     Allergies:   Codeine   Social History   Socioeconomic History  . Marital status: Married    Spouse name: Not on file  . Number of children: Not on file  . Years of education: Not on file  . Highest education level: Not on file  Occupational History  . Not on file  Tobacco Use  . Smoking status: Former Games developer  . Smokeless tobacco: Never Used  Vaping Use  . Vaping Use: Never used  Substance and Sexual Activity  . Alcohol use: Yes    Comment: socially  . Drug use: Never  . Sexual activity: Not on file  Other Topics Concern  . Not on file  Social History Narrative  . Not on file   Social Determinants of Health   Financial Resource Strain:   . Difficulty of Paying Living Expenses: Not on file  Food Insecurity:   . Worried About Programme researcher, broadcasting/film/video in the Last Year: Not on file  . Ran Out of Food in the Last Year: Not on file  Transportation Needs:   . Lack of Transportation (Medical): Not on file  . Lack of Transportation (Non-Medical): Not on file  Physical Activity:   . Days of Exercise per Week: Not on file  . Minutes of Exercise per Session: Not on file  Stress:   .  Feeling of Stress : Not on file  Social Connections:   . Frequency of Communication with Friends and Family: Not on file  . Frequency of Social Gatherings with Friends and Family: Not on file  . Attends Religious Services: Not on file  . Active Member of Clubs or Organizations: Not on file  . Attends Banker Meetings: Not on file  . Marital Status: Not on file     Family History: The patient's family history includes CAD in his father and mother.  ROS:   Please see the history of present illness.    All other systems reviewed and are negative.  EKGs/Labs/Other Studies Reviewed:    The following studies were reviewed today: I discussed my findings with the patient at extensive length   Recent Labs: 02/29/2020: TSH  1.549 03/02/2020: Magnesium 2.5 04/16/2020: BUN 6; Creatinine, Ser 0.73; Hemoglobin 16.2; Platelets 272; Potassium 3.7; Sodium 141 04/29/2020: ALT 32  Recent Lipid Panel    Component Value Date/Time   CHOL 117 04/29/2020 0937   TRIG 211 (H) 04/29/2020 0937   HDL 29 (L) 04/29/2020 0937   CHOLHDL 4.0 04/29/2020 0937   CHOLHDL 6.9 02/29/2020 0659   VLDL 46 (H) 02/29/2020 0659   LDLCALC 54 04/29/2020 0937    Physical Exam:    VS:  BP (!) 140/92   Pulse 86   Ht 5\' 7"  (1.702 m)   Wt 208 lb 9.6 oz (94.6 kg)   SpO2 95%   BMI 32.67 kg/m     Wt Readings from Last 3 Encounters:  07/23/20 208 lb 9.6 oz (94.6 kg)  05/28/20 207 lb (93.9 kg)  05/07/20 202 lb (91.6 kg)     GEN: Patient is in no acute distress HEENT: Normal NECK: No JVD; No carotid bruits LYMPHATICS: No lymphadenopathy CARDIAC: Hear sounds regular, 2/6 systolic murmur at the apex. RESPIRATORY:  Clear to auscultation without rales, wheezing or rhonchi  ABDOMEN: Soft, non-tender, non-distended MUSCULOSKELETAL:  No edema; No deformity  SKIN: Warm and dry NEUROLOGIC:  Alert and oriented x 3 PSYCHIATRIC:  Normal affect   Signed, 05/09/20, MD  07/23/2020 8:16 AM    [facility]

## 2020-07-23 NOTE — Patient Instructions (Signed)
Medication Instructions:  Your physician recommends that you continue on your current medications as directed. Please refer to the Current Medication list given to you today.  *If you need a refill on your cardiac medications before your next appointment, please call your pharmacy*   Lab Work: Your physician recommends that you return for lab work in: 3 months   If you have labs (blood work) drawn today and your tests are completely normal, you will receive your results only by: Marland Kitchen MyChart Message (if you have MyChart) OR . A paper copy in the mail If you have any lab test that is abnormal or we need to change your treatment, we will call you to review the results.   Testing/Procedures: None ordered    Follow-Up: At [facility], you and your health needs are our priority.  As part of our continuing mission to provide you with exceptional heart care, we have created designated Provider Care Teams.  These Care Teams include your primary Cardiologist (physician) and Advanced Practice Providers (APPs -  Physician Assistants and Nurse Practitioners) who all work together to provide you with the care you need, when you need it.  We recommend signing up for the patient portal called "MyChart".  Sign up information is provided on this After Visit Summary.  MyChart is used to connect with patients for Virtual Visits (Telemedicine).  Patients are able to view lab/test results, encounter notes, upcoming appointments, etc.  Non-urgent messages can be sent to your provider as well.   To learn more about what you can do with MyChart, go to [email or web address].    Your next appointment:   6 month(s)  The format for your next appointment:   In Person  Provider:   Belva Crome, MD   Other Instructions Your physician has recommend that you purchase Fish Oil 2 mg twice a day over the counter.

## 2020-08-08 ENCOUNTER — Other Ambulatory Visit: Payer: Self-pay | Admitting: Family Medicine

## 2020-08-08 NOTE — Telephone Encounter (Signed)
Patient needs appt prior to any further refills. Thanks, Dr Sedalia Muta

## 2020-09-08 ENCOUNTER — Other Ambulatory Visit: Payer: Self-pay | Admitting: Family Medicine

## 2020-09-16 ENCOUNTER — Encounter: Payer: Self-pay | Admitting: Nurse Practitioner

## 2020-09-16 ENCOUNTER — Other Ambulatory Visit: Payer: Self-pay

## 2020-09-17 ENCOUNTER — Ambulatory Visit (INDEPENDENT_AMBULATORY_CARE_PROVIDER_SITE_OTHER): Payer: BC Managed Care – PPO | Admitting: Nurse Practitioner

## 2020-09-17 ENCOUNTER — Other Ambulatory Visit: Payer: Self-pay | Admitting: Nurse Practitioner

## 2020-09-17 ENCOUNTER — Encounter: Payer: Self-pay | Admitting: Nurse Practitioner

## 2020-09-17 ENCOUNTER — Other Ambulatory Visit: Payer: Self-pay | Admitting: Family Medicine

## 2020-09-17 ENCOUNTER — Other Ambulatory Visit: Payer: Self-pay

## 2020-09-17 VITALS — BP 108/82 | HR 84 | Temp 97.9°F | Ht 67.0 in | Wt 207.0 lb

## 2020-09-17 DIAGNOSIS — Z125 Encounter for screening for malignant neoplasm of prostate: Secondary | ICD-10-CM | POA: Diagnosis not present

## 2020-09-17 DIAGNOSIS — Z0000 Encounter for general adult medical examination without abnormal findings: Secondary | ICD-10-CM

## 2020-09-17 DIAGNOSIS — Z6832 Body mass index (BMI) 32.0-32.9, adult: Secondary | ICD-10-CM | POA: Diagnosis not present

## 2020-09-17 DIAGNOSIS — Z23 Encounter for immunization: Secondary | ICD-10-CM | POA: Diagnosis not present

## 2020-09-17 MED ORDER — METOPROLOL SUCCINATE ER 100 MG PO TB24: 100.0000 mg | ORAL_TABLET | Freq: Every day | ORAL | 3 refills | Status: DC

## 2020-09-17 MED ORDER — CLOPIDOGREL BISULFATE 75 MG PO TABS
75.0000 mg | ORAL_TABLET | Freq: Every day | ORAL | 3 refills | Status: DC
Start: 2020-09-17 — End: 2021-01-21

## 2020-09-17 NOTE — Progress Notes (Signed)
Subjective:  Patient ID: Sean Joseph, male    DOB: 28-Jan-1966  Age: 54 y.o. MRN: [id number]  Chief Complaint  Patient presents with  . Annual Exam    Fasting physical    HPI  Sean Joseph is a 54 year old Caucasian male present for an annual physical. He has a past medical history of NSTEMI, hypertension, COPD, hyperlipidemia, atherosclerosis of aorta, GERD, and seasonal allergies. Well Adult Physical: Patient here for a comprehensive physical exam.The patient reports problems - seasonal allergies Do you take any herbs or supplements that were not prescribed by a doctor? no Are you taking calcium supplements? no Are you taking aspirin daily? no  Encounter for general adult medical examination without abnormal findings  Physical ("At Risk" items are starred): Patient's last physical exam was 1 year ago .  Smoking: current smoker ;  Physical Activity: Currently exercising 3 times a week ;  Alcohol/Drug Use: Is a social-drinker ; No illicit drug use ;  Patient is not afflicted from Stress Incontinence and Urge Incontinence  Safety: reviewed ; Patient wears a seat belt, has smoke detectors, has carbon monoxide detectors, has no guns in the home, and wears sunscreen with extended sun exposure. Dental Care: biannual cleanings, brushes and flosses daily. Ophthalmology/Optometry: Annual visit.  Hearing loss: none Vision impairments: none Last FAO:ZHYQMVH Colonoscopy Due: States he will contact us for preferred GI doctor    Office Visit from 09/17/2020 in [facility]  PHQ-2 Total Score 0              Social History   Socioeconomic History  . Marital status: Married    Spouse name: Not on file  . Number of children: 3  . Years of education: Not on file  . Highest education level: Not on file  Occupational History  . Not on file  Tobacco Use  . Smoking status: Former Games developer  . Smokeless tobacco: Never Used  Vaping Use  . Vaping Use: Never used  Substance and Sexual  Activity  . Alcohol use: Yes    Comment: socially  . Drug use: Never  . Sexual activity: Not on file  Other Topics Concern  . Not on file  Social History Narrative  . Not on file   Social Determinants of Health   Financial Resource Strain:   . Difficulty of Paying Living Expenses: Not on file  Food Insecurity:   . Worried About Programme researcher, broadcasting/film/video in the Last Year: Not on file  . Ran Out of Food in the Last Year: Not on file  Transportation Needs:   . Lack of Transportation (Medical): Not on file  . Lack of Transportation (Non-Medical): Not on file  Physical Activity:   . Days of Exercise per Week: Not on file  . Minutes of Exercise per Session: Not on file  Stress:   . Feeling of Stress : Not on file  Social Connections:   . Frequency of Communication with Friends and Family: Not on file  . Frequency of Social Gatherings with Friends and Family: Not on file  . Attends Religious Services: Not on file  . Active Member of Clubs or Organizations: Not on file  . Attends Banker Meetings: Not on file  . Marital Status: Not on file   Past Medical History:  Diagnosis Date  . Anginal pain (HCC)   . Atherosclerosis of aorta (HCC)   . COPD (chronic obstructive pulmonary disease) (HCC)   . Coronary artery disease   . Generalized  anxiety disorder   . GERD (gastroesophageal reflux disease)   . Hypertension   . Mixed hyperlipidemia   . Myocardial infarction [facility])    Past Surgical History:  Procedure Laterality Date  . CORONARY ARTERY BYPASS GRAFT N/A 03/01/2020   Procedure: CORONARY ARTERY BYPASS GRAFTING (CABG) X 3, USING BILATERAL MAMMORY ARTERIES, RIGHT LEG GREATER SAPHENOUS VEIN HARVESTED.;  Surgeon: Loreli Slot, MD;  Location: [facility] OR;  Service: Open Heart Surgery;  Laterality: N/A;  . HERNIA REPAIR    . LEFT HEART CATH AND CORONARY ANGIOGRAPHY N/A 02/29/2020   Procedure: LEFT HEART CATH AND CORONARY ANGIOGRAPHY;  Surgeon: Lyn Records, MD;  Location: [facility]  INVASIVE CV LAB;  Service: Cardiovascular;  Laterality: N/A;  . TEE WITHOUT CARDIOVERSION N/A 03/01/2020   Procedure: TRANSESOPHAGEAL ECHOCARDIOGRAM (TEE);  Surgeon: Loreli Slot, MD;  Location: [facility] OR;  Service: Open Heart Surgery;  Laterality: N/A;  . VIDEO BRONCHOSCOPY WITH INSERTION OF INTERBRONCHIAL VALVE (IBV) N/A 03/08/2020   Procedure: VIDEO BRONCHOSCOPY WITH INSERTION OF INTERBRONCHIAL VALVE (IBV);  Surgeon: Loreli Slot, MD;  Location: [facility] OR;  Service: Thoracic;  Laterality: N/A;  . VIDEO BRONCHOSCOPY WITH INSERTION OF INTERBRONCHIAL VALVE (IBV) N/A 04/18/2020   Procedure: VIDEO BRONCHOSCOPY WITH REMOVAL OF INTERBRONCHIAL VALVE (IBV);  Surgeon: Loreli Slot, MD;  Location: [facility] OR;  Service: Thoracic;  Laterality: N/A;    Family History  Problem Relation Age of Onset  . CAD Mother   . CAD Father   . Esophageal cancer Father    Social History   Socioeconomic History  . Marital status: Married    Spouse name: Not on file  . Number of children: 3  . Years of education: Not on file  . Highest education level: Not on file  Occupational History  . Not on file  Tobacco Use  . Smoking status: Former Games developer  . Smokeless tobacco: Never Used  Vaping Use  . Vaping Use: Never used  Substance and Sexual Activity  . Alcohol use: Yes    Comment: socially  . Drug use: Never  . Sexual activity: Not on file  Other Topics Concern  . Not on file  Social History Narrative  . Not on file   Social Determinants of Health   Financial Resource Strain:   . Difficulty of Paying Living Expenses: Not on file  Food Insecurity:   . Worried About Programme researcher, broadcasting/film/video in the Last Year: Not on file  . Ran Out of Food in the Last Year: Not on file  Transportation Needs:   . Lack of Transportation (Medical): Not on file  . Lack of Transportation (Non-Medical): Not on file  Physical Activity:   . Days of Exercise per Week: Not on file  . Minutes of Exercise per Session: Not  on file  Stress:   . Feeling of Stress : Not on file  Social Connections:   . Frequency of Communication with Friends and Family: Not on file  . Frequency of Social Gatherings with Friends and Family: Not on file  . Attends Religious Services: Not on file  . Active Member of Clubs or Organizations: Not on file  . Attends Banker Meetings: Not on file  . Marital Status: Not on file   Review of Systems  Constitutional: Negative for fatigue and fever.  HENT: Negative for congestion, ear pain, sinus pressure and sore throat.   Eyes: Negative for pain.  Respiratory: Negative for cough, chest tightness, shortness of breath and wheezing.  Cardiovascular: Negative for chest pain and palpitations.  Gastrointestinal: Negative for abdominal pain, constipation, diarrhea, nausea and vomiting.  Genitourinary: Negative for dysuria and hematuria.  Musculoskeletal: Negative for arthralgias, back pain, joint swelling and myalgias.  Skin: Negative for rash.  Allergic/Immunologic: Positive for environmental allergies.  Neurological: Negative for dizziness, weakness and headaches.  Psychiatric/Behavioral: Negative for dysphoric mood. The patient is not nervous/anxious.      Objective:  BP 108/82 (BP Location: Left Arm, Patient Position: Sitting)   Pulse 84   Temp 97.9 F (36.6 C) (Temporal)   Ht 5\' 7"  (1.702 m)   Wt 207 lb (93.9 kg)   SpO2 96%   BMI 32.42 kg/m   BP/Weight 09/17/2020 07/23/2020 05/28/2020  Systolic BP 108 140 130  Diastolic BP 82 92 90  Wt. (Lbs) 207 208.6 207  BMI 32.42 32.67 32.42    Physical Exam Vitals reviewed.  Constitutional:      Appearance: Normal appearance.  HENT:     Head: Normocephalic.     Right Ear: Tympanic membrane and external ear normal.     Left Ear: Tympanic membrane and external ear normal.     Nose: Nose normal.     Mouth/Throat:     Mouth: Mucous membranes are moist.  Eyes:     Pupils: Pupils are equal, round, and reactive to  light.  Cardiovascular:     Rate and Rhythm: Normal rate and regular rhythm.     Pulses: Normal pulses.     Heart sounds: Normal heart sounds.  Pulmonary:     Effort: Pulmonary effort is normal.     Breath sounds: Normal breath sounds.  Abdominal:     General: Abdomen is flat. Bowel sounds are normal.     Palpations: Abdomen is soft.  Genitourinary:    Comments: Deferred Musculoskeletal:        General: Normal range of motion.     Cervical back: Normal range of motion.  Skin:    General: Skin is warm and dry.     Capillary Refill: Capillary refill takes less than 2 seconds.  Neurological:     General: No focal deficit present.     Mental Status: He is alert and oriented to person, place, and time.  Psychiatric:        Mood and Affect: Mood normal.        Behavior: Behavior normal.     Lab Results  Component Value Date   WBC 13.0 (H) 04/16/2020   HGB 16.2 04/16/2020   HCT 47.4 04/16/2020   PLT 272 04/16/2020   GLUCOSE 78 04/16/2020   CHOL 117 04/29/2020   TRIG 211 (H) 04/29/2020   HDL 29 (L) 04/29/2020   LDLCALC 54 04/29/2020   ALT 32 04/29/2020   AST 28 04/29/2020   NA 141 04/16/2020   K 3.7 04/16/2020   CL 105 04/16/2020   CREATININE 0.73 04/16/2020   BUN 6 04/16/2020   CO2 27 04/16/2020   TSH 1.549 02/29/2020   INR 1.1 04/16/2020   HGBA1C 6.0 (H) 02/29/2020      Assessment & Plan:  1. Encounter for general adult medical examination w/o abnormal findings - Flu Vaccine MDCK QUAD PF - Comprehensive metabolic panel - CBC With Diff/Platelet - Lipid panel - TSH - PSA  2. Encounter for screening for malignant neoplasm of prostate - PSA    Body mass index is 32.42 kg/m.   These are the goals we discussed: Goals   Continue to eat a heart  healthy diet      Stop smoking     This is a list of the screening recommended for you and due dates:  Health Maintenance  Topic Date Due  . COVID-19 Vaccine (1) Never done  . Colon Cancer Screening  Never done   . Tetanus Vaccine  08/19/2027  . Flu Shot  Completed  .  Hepatitis C: One time screening is recommended by Center for Disease Control  (CDC) for  adults born from [birth year] through 1965.   Completed  . HIV Screening  Completed     AN INDIVIDUALIZED CARE PLAN: was established or reinforced today.   SELF MANAGEMENT: The patient and I together assessed ways to personally work towards obtaining the recommended goals  Support needs The patient and/or family needs were assessed and services were offered if appropriate.     Follow-up: Follow-up 6 months  An After Visit Summary was printed and given to the patient.  Flonnie Hailstone, DNP [facility] [phone]

## 2020-09-18 LAB — COMPREHENSIVE METABOLIC PANEL
ALT: 41 IU/L (ref 0–44)
AST: 33 IU/L (ref 0–40)
Albumin/Globulin Ratio: 1.8 (ref 1.2–2.2)
Albumin: 4.6 g/dL (ref 3.8–4.9)
Alkaline Phosphatase: 104 IU/L (ref 44–121)
BUN/Creatinine Ratio: 10 (ref 9–20)
BUN: 9 mg/dL (ref 6–24)
Bilirubin Total: 0.4 mg/dL (ref 0.0–1.2)
CO2: 24 mmol/L (ref 20–29)
Calcium: 9.5 mg/dL (ref 8.7–10.2)
Chloride: 102 mmol/L (ref 96–106)
Creatinine, Ser: 0.91 mg/dL (ref 0.76–1.27)
GFR calc Af Amer: 110 mL/min/{1.73_m2} (ref >59)
GFR calc non Af Amer: 95 mL/min/{1.73_m2} (ref >59)
Globulin, Total: 2.6 g/dL (ref 1.5–4.5)
Glucose: 98 mg/dL (ref 65–99)
Potassium: 4.7 mmol/L (ref 3.5–5.2)
Sodium: 141 mmol/L (ref 134–144)
Total Protein: 7.2 g/dL (ref 6.0–8.5)

## 2020-09-18 LAB — LIPID PANEL
Chol/HDL Ratio: 3.9 ratio (ref 0.0–5.0)
Cholesterol, Total: 105 mg/dL (ref 100–199)
HDL: 27 mg/dL — ABNORMAL LOW (ref >39)
LDL Chol Calc (NIH): 51 mg/dL (ref 0–99)
Triglycerides: 155 mg/dL — ABNORMAL HIGH (ref 0–149)
VLDL Cholesterol Cal: 27 mg/dL (ref 5–40)

## 2020-09-18 LAB — CBC WITH DIFF/PLATELET
Basophils Absolute: 0 10*3/uL (ref 0.0–0.2)
Basos: 0 %
EOS (ABSOLUTE): 0.2 10*3/uL (ref 0.0–0.4)
Eos: 1 %
Hematocrit: 50.2 % (ref 37.5–51.0)
Hemoglobin: 17.3 g/dL (ref 13.0–17.7)
Immature Grans (Abs): 0 10*3/uL (ref 0.0–0.1)
Immature Granulocytes: 0 %
Lymphocytes Absolute: 3.8 10*3/uL — ABNORMAL HIGH (ref 0.7–3.1)
Lymphs: 32 %
MCH: 31.8 pg (ref 26.6–33.0)
MCHC: 34.5 g/dL (ref 31.5–35.7)
MCV: 92 fL (ref 79–97)
Monocytes Absolute: 0.7 10*3/uL (ref 0.1–0.9)
Monocytes: 6 %
Neutrophils Absolute: 7.1 10*3/uL — ABNORMAL HIGH (ref 1.4–7.0)
Neutrophils: 61 %
Platelets: 227 10*3/uL (ref 150–450)
RBC: 5.44 x10E6/uL (ref 4.14–5.80)
RDW: 14 % (ref 11.6–15.4)
WBC: 11.9 10*3/uL — ABNORMAL HIGH (ref 3.4–10.8)

## 2020-09-18 LAB — CARDIOVASCULAR RISK ASSESSMENT

## 2020-09-18 LAB — PSA: Prostate Specific Ag, Serum: 0.4 ng/mL (ref 0.0–4.0)

## 2020-09-18 LAB — TSH: TSH: 1.42 u[IU]/mL (ref 0.450–4.500)

## 2020-10-23 LAB — LIPID PANEL
Chol/HDL Ratio: 4.6 ratio (ref 0.0–5.0)
Cholesterol, Total: 114 mg/dL (ref 100–199)
HDL: 25 mg/dL — ABNORMAL LOW (ref >39)
LDL Chol Calc (NIH): 49 mg/dL (ref 0–99)
Triglycerides: 256 mg/dL — ABNORMAL HIGH (ref 0–149)
VLDL Cholesterol Cal: 40 mg/dL (ref 5–40)

## 2020-10-23 LAB — HEPATIC FUNCTION PANEL
ALT: 46 IU/L — ABNORMAL HIGH (ref 0–44)
AST: 37 IU/L (ref 0–40)
Albumin: 4.6 g/dL (ref 3.8–4.9)
Alkaline Phosphatase: 109 IU/L (ref 44–121)
Bilirubin Total: 0.4 mg/dL (ref 0.0–1.2)
Bilirubin, Direct: 0.15 mg/dL (ref 0.00–0.40)
Total Protein: 7.6 g/dL (ref 6.0–8.5)

## 2020-10-23 LAB — BASIC METABOLIC PANEL
BUN/Creatinine Ratio: 11 (ref 9–20)
BUN: 11 mg/dL (ref 6–24)
CO2: 25 mmol/L (ref 20–29)
Calcium: 9.7 mg/dL (ref 8.7–10.2)
Chloride: 101 mmol/L (ref 96–106)
Creatinine, Ser: 0.98 mg/dL (ref 0.76–1.27)
GFR calc Af Amer: 101 mL/min/{1.73_m2} (ref >59)
GFR calc non Af Amer: 87 mL/min/{1.73_m2} (ref >59)
Glucose: 89 mg/dL (ref 65–99)
Potassium: 4.6 mmol/L (ref 3.5–5.2)
Sodium: 140 mmol/L (ref 134–144)

## 2020-10-24 MED ORDER — FENOFIBRATE 145 MG PO TABS: 145.0000 mg | ORAL_TABLET | Freq: Every day | ORAL | 12 refills | Status: DC

## 2020-10-24 NOTE — Addendum Note (Signed)
Addended by: Eleonore Chiquito on: 10/24/2020 04:36 PM   Modules accepted: Orders

## 2020-10-25 NOTE — Progress Notes (Signed)
Pt is on statin therapy: Lipitor 80 mg daily; Dr Tomie China has added Fenofibrate 145 mg for elevated lipid panel.  He has been adherent with medications per spouse.

## 2020-12-25 ENCOUNTER — Encounter: Payer: Self-pay | Admitting: Nurse Practitioner

## 2020-12-25 ENCOUNTER — Telehealth (INDEPENDENT_AMBULATORY_CARE_PROVIDER_SITE_OTHER): Payer: BC Managed Care – PPO | Admitting: Nurse Practitioner

## 2020-12-25 ENCOUNTER — Other Ambulatory Visit: Payer: Self-pay | Admitting: Nurse Practitioner

## 2020-12-25 DIAGNOSIS — J441 Chronic obstructive pulmonary disease with (acute) exacerbation: Secondary | ICD-10-CM | POA: Diagnosis not present

## 2020-12-25 DIAGNOSIS — F17218 Nicotine dependence, cigarettes, with other nicotine-induced disorders: Secondary | ICD-10-CM

## 2020-12-25 DIAGNOSIS — I2510 Atherosclerotic heart disease of native coronary artery without angina pectoris: Secondary | ICD-10-CM | POA: Diagnosis not present

## 2020-12-25 DIAGNOSIS — J018 Other acute sinusitis: Secondary | ICD-10-CM | POA: Diagnosis not present

## 2020-12-25 DIAGNOSIS — Z951 Presence of aortocoronary bypass graft: Secondary | ICD-10-CM

## 2020-12-25 DIAGNOSIS — I252 Old myocardial infarction: Secondary | ICD-10-CM

## 2020-12-25 MED ORDER — AZITHROMYCIN 250 MG PO TABS: ORAL_TABLET | ORAL | 0 refills | Status: DC

## 2020-12-25 MED ORDER — FLUTICASONE PROPIONATE 50 MCG/ACT NA SUSP: 2.0000 | Freq: Every day | NASAL | 6 refills | Status: DC

## 2020-12-25 MED ORDER — ATORVASTATIN CALCIUM 80 MG PO TABS: 80.0000 mg | ORAL_TABLET | Freq: Every day | ORAL | 3 refills | Status: DC

## 2020-12-25 NOTE — Progress Notes (Signed)
Virtual Visit via Telephone Note   This visit type was conducted due to national recommendations for restrictions regarding the COVID-19 Pandemic (e.g. social distancing) in an effort to limit this patient's exposure and mitigate transmission in our community.  Due to his co-morbid illnesses, this patient is at least at moderate risk for complications without adequate follow up.  This format is felt to be most appropriate for this patient at this time.  The patient did not have access to video technology/had technical difficulties with video requiring transitioning to audio format only (telephone).  All issues noted in this document were discussed and addressed.  No physical exam could be performed with this format.  Patient verbally consented to a telehealth visit.   Date:  12/25/2020   ID:  Sean Joseph, DOB December 26, 1965, MRN [id number]  Patient Location: Home Provider Location: Office/Clinic  PCP:  Blane Ohara, Sean Joseph   Evaluation Performed: Established patient, acute telemedicine visit  Chief Complaint:  Sinus congestion  History of Present Illness:    Sean Joseph is a 55 y.o. male with nasal congestion, rhinorrhea, post-nasal-drip, sore throat, and cough. Onset of symptoms was 63-month ago. Treatment has included honey and Albuterol inhaler. He states his cough has become productive with thick yellowish/green sputum with increased dyspnea with activity and lying down. He denies known exposure to ill-contacts.  He has a past medical history of COPD and CAD. He denies chest pain, nausea, or vomiting. He has declined COVID-19 vaccines.  The patient does have symptoms concerning for COVID-19 infection (fever, chills, cough, or new shortness of breath).    Past Medical History:  Diagnosis Date  . Anginal pain (HCC)   . Atherosclerosis of aorta (HCC)   . COPD (chronic obstructive pulmonary disease) (HCC)   . Coronary artery disease   . Generalized anxiety disorder   . GERD  (gastroesophageal reflux disease)   . Hypertension   . Mixed hyperlipidemia   . Myocardial infarction [facility])     Past Surgical History:  Procedure Laterality Date  . CORONARY ARTERY BYPASS GRAFT N/A 03/01/2020   Procedure: CORONARY ARTERY BYPASS GRAFTING (CABG) X 3, USING BILATERAL MAMMORY ARTERIES, RIGHT LEG GREATER SAPHENOUS VEIN HARVESTED.;  Surgeon: Loreli Slot, Sean Joseph;  Location: [facility] OR;  Service: Open Heart Surgery;  Laterality: N/A;  . HERNIA REPAIR    . LEFT HEART CATH AND CORONARY ANGIOGRAPHY N/A 02/29/2020   Procedure: LEFT HEART CATH AND CORONARY ANGIOGRAPHY;  Surgeon: Lyn Records, Sean Joseph;  Location: [facility] INVASIVE CV LAB;  Service: Cardiovascular;  Laterality: N/A;  . TEE WITHOUT CARDIOVERSION N/A 03/01/2020   Procedure: TRANSESOPHAGEAL ECHOCARDIOGRAM (TEE);  Surgeon: Loreli Slot, Sean Joseph;  Location: [facility] OR;  Service: Open Heart Surgery;  Laterality: N/A;  . VIDEO BRONCHOSCOPY WITH INSERTION OF INTERBRONCHIAL VALVE (IBV) N/A 03/08/2020   Procedure: VIDEO BRONCHOSCOPY WITH INSERTION OF INTERBRONCHIAL VALVE (IBV);  Surgeon: Loreli Slot, Sean Joseph;  Location: [facility] OR;  Service: Thoracic;  Laterality: N/A;  . VIDEO BRONCHOSCOPY WITH INSERTION OF INTERBRONCHIAL VALVE (IBV) N/A 04/18/2020   Procedure: VIDEO BRONCHOSCOPY WITH REMOVAL OF INTERBRONCHIAL VALVE (IBV);  Surgeon: Loreli Slot, Sean Joseph;  Location: [facility] OR;  Service: Thoracic;  Laterality: N/A;    Family History  Problem Relation Age of Onset  . CAD Mother   . CAD Father   . Esophageal cancer Father     Social History   Socioeconomic History  . Marital status: Married    Spouse name: Not on file  . Number of children: 3  .  Years of education: Not on file  . Highest education level: Not on file  Occupational History  . Not on file  Tobacco Use  . Smoking status: Current Every Day Smoker    Packs/day: 1.50    Years: 34.00    Pack years: 51.00  . Smokeless tobacco: Never Used  Vaping Use  . Vaping Use: Never  used  Substance and Sexual Activity  . Alcohol use: Yes    Comment: socially  . Drug use: Never  . Sexual activity: Not on file  Other Topics Concern  . Not on file  Social History Narrative  . Not on file   Social Determinants of Health   Financial Resource Strain: Not on file  Food Insecurity: Not on file  Transportation Needs: Not on file  Physical Activity: Not on file  Stress: Not on file  Social Connections: Not on file  Intimate Partner Violence: Not on file    Outpatient Medications Prior to Visit  Medication Sig Dispense Refill  . albuterol (VENTOLIN HFA) 108 (90 Base) MCG/ACT inhaler Inhale 1-2 puffs into the lungs every 4 (four) hours as needed for wheezing or shortness of breath.    Marland Kitchen aspirin 81 MG EC tablet Take 1 tablet (81 mg total) by mouth daily.    Marland Kitchen atorvastatin (LIPITOR) 80 MG tablet Take 1 tablet (80 mg total) by mouth daily at 6 PM. 90 tablet 3  . clopidogrel (PLAVIX) 75 MG tablet Take 1 tablet (75 mg total) by mouth daily. 90 tablet 3  . fenofibrate (TRICOR) 145 MG tablet Take 1 tablet (145 mg total) by mouth daily. 30 tablet 12  . Fluticasone-Umeclidin-Vilant (TRELEGY ELLIPTA) 100-62.5-25 MCG/INH AEPB Inhale 1 puff into the lungs daily as needed (shortness of breat).     . metoprolol succinate (TOPROL-XL) 100 MG 24 hr tablet Take 1 tablet (100 mg total) by mouth daily. Take with or immediately following a meal. 90 tablet 3  . montelukast (SINGULAIR) 10 MG tablet TAKE 1 TABLET(10 MG) BY MOUTH AT BEDTIME 90 tablet 3  . pantoprazole (PROTONIX) 40 MG tablet Take 1 tablet (40 mg total) by mouth daily. 30 tablet 2   No facility-administered medications prior to visit.    Allergies:   Codeine   Social History   Tobacco Use  . Smoking status: Current Every Day Smoker    Packs/day: 1.50    Years: 34.00    Pack years: 51.00  . Smokeless tobacco: Never Used  Vaping Use  . Vaping Use: Never used  Substance Use Topics  . Alcohol use: Yes    Comment:  socially  . Drug use: Never     Review of Systems  Constitutional: Positive for malaise/fatigue. Negative for fever.  HENT: Positive for congestion, ear pain (bilateral ear fullness), sinus pain and sore throat.        Rhinorrhea and post-nasal-drip  Eyes: Negative for pain.  Respiratory: Positive for cough, sputum production, shortness of breath and wheezing.   Cardiovascular: Negative for chest pain, palpitations, orthopnea and leg swelling.  Gastrointestinal: Negative for abdominal pain, constipation, diarrhea, nausea and vomiting.  Genitourinary: Negative for dysuria and frequency.  Musculoskeletal: Negative for back pain, joint pain, myalgias and neck pain.  Skin: Negative for rash.  Neurological: Positive for headaches.  Endo/Heme/Allergies: Positive for environmental allergies.     Labs/Other Tests and Data Reviewed:    Recent Labs: 03/02/2020: Magnesium 2.5 09/17/2020: Hemoglobin 17.3; Platelets 227; TSH 1.420 10/23/2020: ALT 46; BUN 11; Creatinine, Ser 0.98; Potassium  4.6; Sodium 140   Recent Lipid Panel Lab Results  Component Value Date/Time   CHOL 114 10/23/2020 09:39 AM   TRIG 256 (H) 10/23/2020 09:39 AM   HDL 25 (L) 10/23/2020 09:39 AM   CHOLHDL 4.6 10/23/2020 09:39 AM   CHOLHDL 6.9 02/29/2020 06:59 AM   LDLCALC 49 10/23/2020 09:39 AM    Wt Readings from Last 3 Encounters:  09/17/20 207 lb (93.9 kg)  07/23/20 208 lb 9.6 oz (94.6 kg)  05/28/20 207 lb (93.9 kg)     Objective:    Vital Signs:  There were no vitals taken for this visit.   Physical Exam No physical exam due to telemedicine  ASSESSMENT & PLAN:    1. Acute non-recurrent sinusitis of other sinus - azithromycin (ZITHROMAX) 250 MG tablet; Take two tablets by mouth on day one, take one tablet by mouth on days two-five  Dispense: 6 tablet; Refill: 0 - fluticasone (FLONASE) 50 MCG/ACT nasal spray; Place 2 sprays into both nostrils daily.  Dispense: 16 g; Refill: 6  2. COPD exacerbation (HCC) -  azithromycin (ZITHROMAX) 250 MG tablet; Take two tablets by mouth on day one, take one tablet by mouth on days two-five  Dispense: 6 tablet; Refill: 0  3. Cigarette nicotine dependence with other nicotine-induced disorder -Cessation recommended  4. Coronary artery disease involving native coronary artery of native heart without angina pectoris - Continue cardiac medications as prescribed  5. Hx of CABG -Continue cardiac medications as prescribed  6. History of MI (myocardial infarction) -Seek emergency medical care if needed  Rest and push fluids Continue Claritin and Flonase as directed Seek emergency medical care if needed Follow-up with office if symptoms worsen or fail to improve  COVID-19 Education: The signs and symptoms of COVID-19 were discussed with the patient and how to seek care for testing (follow up with PCP or arrange E-visit). The importance of social distancing was discussed today.   I spent 10 minutes dedicated to the care of this patient on the date of this encounter to include telephone time with the patient, as well as: EMR review and prescription medication management.  Follow Up:  Virtual Visit  prn  Signed,  Janie Morning, NP  12/25/2020 9:34 AM    [facility] [place]

## 2021-01-20 DIAGNOSIS — I10 Essential (primary) hypertension: Secondary | ICD-10-CM | POA: Insufficient documentation

## 2021-01-20 DIAGNOSIS — J449 Chronic obstructive pulmonary disease, unspecified: Secondary | ICD-10-CM | POA: Insufficient documentation

## 2021-01-20 DIAGNOSIS — K219 Gastro-esophageal reflux disease without esophagitis: Secondary | ICD-10-CM | POA: Insufficient documentation

## 2021-01-20 DIAGNOSIS — F411 Generalized anxiety disorder: Secondary | ICD-10-CM | POA: Insufficient documentation

## 2021-01-20 DIAGNOSIS — I700 Atherosclerosis of aorta: Secondary | ICD-10-CM | POA: Insufficient documentation

## 2021-01-20 DIAGNOSIS — I2510 Atherosclerotic heart disease of native coronary artery without angina pectoris: Secondary | ICD-10-CM | POA: Insufficient documentation

## 2021-01-20 DIAGNOSIS — I209 Angina pectoris, unspecified: Secondary | ICD-10-CM | POA: Insufficient documentation

## 2021-01-20 DIAGNOSIS — I219 Acute myocardial infarction, unspecified: Secondary | ICD-10-CM | POA: Insufficient documentation

## 2021-01-20 DIAGNOSIS — E782 Mixed hyperlipidemia: Secondary | ICD-10-CM | POA: Insufficient documentation

## 2021-01-21 ENCOUNTER — Ambulatory Visit: Payer: BC Managed Care – PPO | Admitting: Cardiology

## 2021-01-21 ENCOUNTER — Encounter: Payer: Self-pay | Admitting: Cardiology

## 2021-01-21 ENCOUNTER — Other Ambulatory Visit: Payer: Self-pay

## 2021-01-21 VITALS — BP 114/88 | HR 78 | Ht 67.0 in | Wt 211.0 lb

## 2021-01-21 DIAGNOSIS — I10 Essential (primary) hypertension: Secondary | ICD-10-CM

## 2021-01-21 DIAGNOSIS — I2510 Atherosclerotic heart disease of native coronary artery without angina pectoris: Secondary | ICD-10-CM

## 2021-01-21 DIAGNOSIS — E782 Mixed hyperlipidemia: Secondary | ICD-10-CM | POA: Diagnosis not present

## 2021-01-21 DIAGNOSIS — Z720 Tobacco use: Secondary | ICD-10-CM | POA: Diagnosis not present

## 2021-01-21 MED ORDER — NITROGLYCERIN 0.4 MG SL SUBL: 0.4000 mg | SUBLINGUAL_TABLET | SUBLINGUAL | 6 refills | Status: DC | PRN

## 2021-01-21 MED ORDER — METOPROLOL SUCCINATE ER 100 MG PO TB24: 100.0000 mg | ORAL_TABLET | Freq: Every day | ORAL | 3 refills | Status: DC

## 2021-01-21 NOTE — Patient Instructions (Signed)
Medication Instructions:  Your physician has recommended you make the following change in your medication:  Stop Plavix Use nitroglycerin as needed for chest pain.   *If you need a refill on your cardiac medications before your next appointment, please call your pharmacy*   Lab Work: Your physician recommends that you return for lab work in: 6 weeks (03/04/21). You need to have labs done when you are fasting.  You can come Monday through Friday 8:30 am to 12:00 pm and 1:15 to 4:30. You do not need to make an appointment as the order has already been placed. The labs you are going to have done are BMET, CBC, TSH, LFT and Lipids.  If you have labs (blood work) drawn today and your tests are completely normal, you will receive your results only by: Marland Kitchen MyChart Message (if you have MyChart) OR . A paper copy in the mail If you have any lab test that is abnormal or we need to change your treatment, we will call you to review the results.   Testing/Procedures: None ordered   Follow-Up: At [facility], you and your health needs are our priority.  As part of our continuing mission to provide you with exceptional heart care, we have created designated Provider Care Teams.  These Care Teams include your primary Cardiologist (physician) and Advanced Practice Providers (APPs -  Physician Assistants and Nurse Practitioners) who all work together to provide you with the care you need, when you need it.  We recommend signing up for the patient portal called "MyChart".  Sign up information is provided on this After Visit Summary.  MyChart is used to connect with patients for Virtual Visits (Telemedicine).  Patients are able to view lab/test results, encounter notes, upcoming appointments, etc.  Non-urgent messages can be sent to your provider as well.   To learn more about what you can do with MyChart, go to [email or web address].    Your next appointment:   4 month(s)  The format for your next  appointment:   In Person  Provider:   Belva Crome, MD   Other Instructions NA

## 2021-01-21 NOTE — Progress Notes (Addendum)
Cardiology Office Note:    Date:  01/21/2021   ID:  [name], DOB [birth year], MRN [id number]  PCP:  Blane Ohara, MD  Cardiologist:  Garwin Brothers, MD   Referring MD: Blane Ohara, MD    ASSESSMENT:    1. Coronary artery disease involving native coronary artery of native heart without angina pectoris   2. Essential hypertension   3. Mixed hyperlipidemia   4. Hypertension, unspecified type   5. Tobacco abuse    PLAN:    In order of problems listed above:  1. Coronary artery disease: Secondary prevention stressed with the patient.  Importance of compliance with diet medication stressed any vocalized understanding.  He was advised to walk at least half an hour a day 5 times a week and he promises to do so.  Sublingual nitroglycerin prescription was sent, its protocol and 911 protocol explained and the patient vocalized understanding questions were answered to the patient's satisfaction.  He uses clopidogrel and I asked him to stop doing so after he is finished with his current medications which is probably for the next 3 to 4 days of what medicines he has left.  Then he can use a coated baby aspirin daily. 2. Essential hypertension: Blood pressure stable and diet was emphasized. 3. Mixed dyslipidemia: Elevated triglycerides and I mentioned to him about diet extensively.  I told him that we will check his blood work back in 6 weeks.  In the interim he will do well with diet and exercise. 4. Abdominal obesity: Weight reduction was stressed and he promises to do better. 5. Cigarette smoker: Patient mentions to me that he has quit smoking 2 weeks ago and is trying not to go back to smoke.  I spent 5 minutes with the patient discussing solely about smoking. Smoking cessation was counseled. I suggested to the patient also different medications and pharmacological interventions. Patient is keen to try stopping on its own at this time. He will get back to me if he needs any further  assistance in this matter. 6. Patient will be seen in follow-up appointment in 6 months or earlier if the patient has any concerns    Medication Adjustments/Labs and Tests Ordered: Current medicines are reviewed at length with the patient today.  Concerns regarding medicines are outlined above.  Orders Placed This Encounter  Procedures  . Basic metabolic panel  . CBC with Differential/Platelet  . Hepatic function panel  . Lipid panel  . TSH   Meds ordered this encounter  Medications  . metoprolol succinate (TOPROL-XL) 100 MG 24 hr tablet    Sig: Take 1 tablet (100 mg total) by mouth daily. Take with or immediately following a meal.    Dispense:  90 tablet    Refill:  3  . nitroGLYCERIN (NITROSTAT) 0.4 MG SL tablet    Sig: Place 1 tablet (0.4 mg total) under the tongue every 5 (five) minutes as needed.    Dispense:  25 tablet    Refill:  6     No chief complaint on file.    History of Present Illness:    Sean Joseph is a 55 y.o. male.  Patient has past medical history of coronary artery disease post CABG surgery, essential hypertension, dyslipidemia and abdominal obesity.  He is an active smoker and quit about 2 weeks ago.  He denies any chest pain orthopnea or PND.  He is an active gentleman and works full-time.  At the time of my evaluation, the patient is  alert awake oriented and in no distress.  Past Medical History:  Diagnosis Date  . Anginal pain (HCC)   . Atherosclerosis of aorta (HCC)   . CAD (coronary artery disease) 04/29/2020  . Chest pain 02/28/2020  . COPD (chronic obstructive pulmonary disease) (HCC)   . Coronary artery disease   . Essential hypertension 07/23/2020  . Generalized anxiety disorder   . GERD (gastroesophageal reflux disease)   . Hx of CABG 03/01/2020  . Hypertension   . Mixed dyslipidemia 07/23/2020  . Mixed hyperlipidemia   . Myocardial infarction (HCC)   . Non-ST elevation (NSTEMI) myocardial infarction (HCC)   . Tobacco abuse     Past  Surgical History:  Procedure Laterality Date  . CORONARY ARTERY BYPASS GRAFT N/A 03/01/2020   Procedure: CORONARY ARTERY BYPASS GRAFTING (CABG) X 3, USING BILATERAL MAMMORY ARTERIES, RIGHT LEG GREATER SAPHENOUS VEIN HARVESTED.;  Surgeon: Loreli Slot, MD;  Location: [facility] OR;  Service: Open Heart Surgery;  Laterality: N/A;  . HERNIA REPAIR    . LEFT HEART CATH AND CORONARY ANGIOGRAPHY N/A 02/29/2020   Procedure: LEFT HEART CATH AND CORONARY ANGIOGRAPHY;  Surgeon: Lyn Records, MD;  Location: [facility];  Service: Cardiovascular;  Laterality: N/A;  . TEE WITHOUT CARDIOVERSION N/A 03/01/2020   Procedure: TRANSESOPHAGEAL ECHOCARDIOGRAM (TEE);  Surgeon: Loreli Slot, MD;  Location: [facility] OR;  Service: Open Heart Surgery;  Laterality: N/A;  . VIDEO BRONCHOSCOPY WITH INSERTION OF INTERBRONCHIAL VALVE (IBV) N/A 03/08/2020   Procedure: VIDEO BRONCHOSCOPY WITH INSERTION OF INTERBRONCHIAL VALVE (IBV);  Surgeon: Loreli Slot, MD;  Location: [facility];  Service: Thoracic;  Laterality: N/A;  . VIDEO BRONCHOSCOPY WITH INSERTION OF INTERBRONCHIAL VALVE (IBV) N/A 04/18/2020   Procedure: VIDEO BRONCHOSCOPY WITH REMOVAL OF INTERBRONCHIAL VALVE (IBV);  Surgeon: Loreli Slot, MD;  Location: [facility] OR;  Service: Thoracic;  Laterality: N/A;    Current Medications: Current Meds  Medication Sig  . albuterol (VENTOLIN HFA) 108 (90 Base) MCG/ACT inhaler Inhale 1-2 puffs into the lungs every 4 (four) hours as needed for wheezing or shortness of breath.  Marland Kitchen aspirin 81 MG EC tablet Take 1 tablet (81 mg total) by mouth daily.  Marland Kitchen atorvastatin (LIPITOR) 80 MG tablet Take 1 tablet (80 mg total) by mouth daily at 6 PM.  . fenofibrate (TRICOR) 145 MG tablet Take 1 tablet (145 mg total) by mouth daily.  . fluticasone (FLONASE) 50 MCG/ACT nasal spray Place 2 sprays into both nostrils daily.  . Fluticasone-Umeclidin-Vilant (TRELEGY ELLIPTA) 100-62.5-25 MCG/INH AEPB Inhale 1 puff into the lungs daily as  needed (shortness of breat).   . montelukast (SINGULAIR) 10 MG tablet TAKE 1 TABLET(10 MG) BY MOUTH AT BEDTIME  . nitroGLYCERIN (NITROSTAT) 0.4 MG SL tablet Place 1 tablet (0.4 mg total) under the tongue every 5 (five) minutes as needed.  . pantoprazole (PROTONIX) 40 MG tablet Take 1 tablet (40 mg total) by mouth daily.  . [DISCONTINUED] clopidogrel (PLAVIX) 75 MG tablet Take 1 tablet (75 mg total) by mouth daily.     Allergies:   Codeine   Social History   Socioeconomic History  . Marital status: Married    Spouse name: Not on file  . Number of children: 3  . Years of education: Not on file  . Highest education level: Not on file  Occupational History  . Not on file  Tobacco Use  . Smoking status: Current Every Day Smoker    Packs/day: 1.50    Years: 34.00  Pack years: 51.00  . Smokeless tobacco: Never Used  Vaping Use  . Vaping Use: Never used  Substance and Sexual Activity  . Alcohol use: Yes    Comment: socially  . Drug use: Never  . Sexual activity: Not on file  Other Topics Concern  . Not on file  Social History Narrative  . Not on file   Social Determinants of Health   Financial Resource Strain: Not on file  Food Insecurity: Not on file  Transportation Needs: Not on file  Physical Activity: Not on file  Stress: Not on file  Social Connections: Not on file     Family History: The patient's family history includes CAD in his father and mother; Esophageal cancer in his father.  ROS:   Please see the history of present illness.    All other systems reviewed and are negative.  EKGs/Labs/Other Studies Reviewed:    The following studies were reviewed today: LEFT HEART CATH AND CORONARY ANGIOGRAPHY    Conclusion   Apical severe hypokinesis.  EF 40%.  LVEDP 10 mmHg.  Normal left main  Anatomically small LAD that stopped short of the left ventricular apex.  The mid segment is totally occluded after the origin of a large septal perforator.  Relatively  long segment of total occlusion.  A small to moderate sized LAD fills by left to left collaterals.  Codominant circumflex with first obtuse marginal containing 60 and 70% stenosis proximal and distal.  RCA is codominant.  Proximal segmental 80% stenosis mid 60% stenosis and towards the distal portion of the mid segment there is a 95% stenosis.  The PDA runs the entire length of the posterior interventricular groove and wraps around the left ventricular apex.  RECOMMENDATIONS:   Debate treatment strategy. A potential option is extensive stenting in the proximal to distal RCA and treat LAD and obtuse marginal disease with medical therapy.  An alternative approach would be to consider arterial grafting of LAD if technically possible as well as arterial grafting of the right coronary with medical therapy of the obtuse marginal.  Given the patient's young age, it would be worth having surgical consultation prior to considering intervention.  The interventional team will need to weigh in relative to final treatment decision.     Recent Labs: 03/02/2020: Magnesium 2.5 09/17/2020: Hemoglobin 17.3; Platelets 227; TSH 1.420 10/23/2020: ALT 46; BUN 11; Creatinine, Ser 0.98; Potassium 4.6; Sodium 140  Recent Lipid Panel    Component Value Date/Time   CHOL 114 10/23/2020 0939   TRIG 256 (H) 10/23/2020 0939   HDL 25 (L) 10/23/2020 0939   CHOLHDL 4.6 10/23/2020 0939   CHOLHDL 6.9 02/29/2020 0659   VLDL 46 (H) 02/29/2020 0659   LDLCALC 49 10/23/2020 0939    Physical Exam:    VS:  BP 114/88   Pulse 78   Ht 5\' 7"  (1.702 m)   Wt 211 lb (95.7 kg)   SpO2 96%   BMI 33.05 kg/m     Wt Readings from Last 3 Encounters:  01/21/21 211 lb (95.7 kg)  09/17/20 207 lb (93.9 kg)  07/23/20 208 lb 9.6 oz (94.6 kg)     GEN: Patient is in no acute distress HEENT: Normal NECK: No JVD; No carotid bruits LYMPHATICS: No lymphadenopathy CARDIAC: Hear sounds regular, 2/6 systolic murmur at the  apex. RESPIRATORY:  Clear to auscultation without rales, wheezing or rhonchi  ABDOMEN: Soft, non-tender, non-distended MUSCULOSKELETAL:  No edema; No deformity  SKIN: Warm and dry NEUROLOGIC:  Alert and oriented x 3 PSYCHIATRIC:  Normal affect   Signed, Garwin Brothers, MD  01/21/2021 10:02 AM    [facility]

## 2021-03-05 LAB — BASIC METABOLIC PANEL
BUN/Creatinine Ratio: 13 (ref 9–20)
BUN: 15 mg/dL (ref 6–24)
CO2: 23 mmol/L (ref 20–29)
Calcium: 10.2 mg/dL (ref 8.7–10.2)
Chloride: 100 mmol/L (ref 96–106)
Creatinine, Ser: 1.13 mg/dL (ref 0.76–1.27)
Glucose: 103 mg/dL — ABNORMAL HIGH (ref 65–99)
Potassium: 4.7 mmol/L (ref 3.5–5.2)
Sodium: 138 mmol/L (ref 134–144)
eGFR: 77 mL/min/{1.73_m2} (ref >59)

## 2021-03-05 LAB — CBC WITH DIFFERENTIAL/PLATELET
Basophils Absolute: 0.1 10*3/uL (ref 0.0–0.2)
Basos: 0 %
EOS (ABSOLUTE): 0.2 10*3/uL (ref 0.0–0.4)
Eos: 1 %
Hematocrit: 51.5 % — ABNORMAL HIGH (ref 37.5–51.0)
Hemoglobin: 17.4 g/dL (ref 13.0–17.7)
Immature Grans (Abs): 0.1 10*3/uL (ref 0.0–0.1)
Immature Granulocytes: 1 %
Lymphocytes Absolute: 3.6 10*3/uL — ABNORMAL HIGH (ref 0.7–3.1)
Lymphs: 25 %
MCH: 31.3 pg (ref 26.6–33.0)
MCHC: 33.8 g/dL (ref 31.5–35.7)
MCV: 93 fL (ref 79–97)
Monocytes Absolute: 1 10*3/uL — ABNORMAL HIGH (ref 0.1–0.9)
Monocytes: 7 %
Neutrophils Absolute: 9.7 10*3/uL — ABNORMAL HIGH (ref 1.4–7.0)
Neutrophils: 66 %
Platelets: 243 10*3/uL (ref 150–450)
RBC: 5.56 x10E6/uL (ref 4.14–5.80)
RDW: 13.5 % (ref 11.6–15.4)
WBC: 14.6 10*3/uL — ABNORMAL HIGH (ref 3.4–10.8)

## 2021-03-05 LAB — HEPATIC FUNCTION PANEL
ALT: 47 IU/L — ABNORMAL HIGH (ref 0–44)
AST: 39 IU/L (ref 0–40)
Albumin: 4.7 g/dL (ref 3.8–4.9)
Alkaline Phosphatase: 69 IU/L (ref 44–121)
Bilirubin Total: 0.4 mg/dL (ref 0.0–1.2)
Bilirubin, Direct: 0.12 mg/dL (ref 0.00–0.40)
Total Protein: 7.5 g/dL (ref 6.0–8.5)

## 2021-03-05 LAB — TSH: TSH: 1.28 u[IU]/mL (ref 0.450–4.500)

## 2021-03-05 LAB — LIPID PANEL
Chol/HDL Ratio: 5.5 ratio — ABNORMAL HIGH (ref 0.0–5.0)
Cholesterol, Total: 148 mg/dL (ref 100–199)
HDL: 27 mg/dL — ABNORMAL LOW (ref >39)
LDL Chol Calc (NIH): 76 mg/dL (ref 0–99)
Triglycerides: 276 mg/dL — ABNORMAL HIGH (ref 0–149)
VLDL Cholesterol Cal: 45 mg/dL — ABNORMAL HIGH (ref 5–40)

## 2021-03-07 MED ORDER — VASCEPA 0.5 G PO CAPS: 1.0000 | ORAL_CAPSULE | Freq: Two times a day (BID) | ORAL | 3 refills | Status: DC

## 2021-03-07 NOTE — Addendum Note (Signed)
Addended by: Eleonore Chiquito on: 03/07/2021 09:13 AM   Modules accepted: Orders

## 2021-03-11 ENCOUNTER — Other Ambulatory Visit: Payer: Self-pay | Admitting: Cardiology

## 2021-03-11 NOTE — Telephone Encounter (Signed)
Refill sent to pharmacy.   

## 2021-03-13 ENCOUNTER — Telehealth: Payer: Self-pay

## 2021-03-13 NOTE — Telephone Encounter (Signed)
Patient started taking Vascepa and picked up Friday last week since then patient has chest pain, slight dizziness, and right foot swelling.  Blood pressure readings today are 116/88 heart rate of 68 122/85 heart rate of 72. Advised patient to stop taking this medication and we would call him back with recommendations from the doctor.

## 2021-03-14 ENCOUNTER — Other Ambulatory Visit: Payer: Self-pay | Admitting: Cardiovascular Disease

## 2021-03-18 NOTE — Telephone Encounter (Signed)
Left voicemail for patient to return call.

## 2021-03-18 NOTE — Telephone Encounter (Signed)
Patient returning call.

## 2021-03-18 NOTE — Telephone Encounter (Signed)
Spoke to the patient just now and let him know Dr. Revankar's recommendations. He verbalizes understanding and thanks me for the call back.  °

## 2021-04-28 ENCOUNTER — Other Ambulatory Visit: Payer: Self-pay | Admitting: *Deleted

## 2021-04-28 DIAGNOSIS — R911 Solitary pulmonary nodule: Secondary | ICD-10-CM

## 2021-04-28 NOTE — Progress Notes (Unsigned)
Ct

## 2021-05-30 ENCOUNTER — Other Ambulatory Visit: Payer: Self-pay

## 2021-06-02 ENCOUNTER — Ambulatory Visit (INDEPENDENT_AMBULATORY_CARE_PROVIDER_SITE_OTHER): Payer: BC Managed Care – PPO | Admitting: Cardiology

## 2021-06-02 ENCOUNTER — Encounter: Payer: Self-pay | Admitting: Cardiology

## 2021-06-02 ENCOUNTER — Other Ambulatory Visit: Payer: Self-pay

## 2021-06-02 VITALS — BP 136/88 | HR 75 | Ht 67.0 in | Wt 219.0 lb

## 2021-06-02 DIAGNOSIS — I2510 Atherosclerotic heart disease of native coronary artery without angina pectoris: Secondary | ICD-10-CM

## 2021-06-02 DIAGNOSIS — I10 Essential (primary) hypertension: Secondary | ICD-10-CM

## 2021-06-02 DIAGNOSIS — E782 Mixed hyperlipidemia: Secondary | ICD-10-CM | POA: Diagnosis not present

## 2021-06-02 DIAGNOSIS — Z951 Presence of aortocoronary bypass graft: Secondary | ICD-10-CM | POA: Diagnosis not present

## 2021-06-02 NOTE — Patient Instructions (Addendum)
Medication Instructions:  Your physician has recommended you make the following change in your medication:   Stop Plavix after current RX is finished.  *If you need a refill on your cardiac medications before your next appointment, please call your pharmacy*   Lab Work: None ordered If you have labs (blood work) drawn today and your tests are completely normal, you will receive your results only by: MyChart Message (if you have MyChart) OR A paper copy in the mail If you have any lab test that is abnormal or we need to change your treatment, we will call you to review the results.   Testing/Procedures: None ordered   Follow-Up: At [facility], you and your health needs are our priority.  As part of our continuing mission to provide you with exceptional heart care, we have created designated Provider Care Teams.  These Care Teams include your primary Cardiologist (physician) and Advanced Practice Providers (APPs -  Physician Assistants and Nurse Practitioners) who all work together to provide you with the care you need, when you need it.  We recommend signing up for the patient portal called "MyChart".  Sign up information is provided on this After Visit Summary.  MyChart is used to connect with patients for Virtual Visits (Telemedicine).  Patients are able to view lab/test results, encounter notes, upcoming appointments, etc.  Non-urgent messages can be sent to your provider as well.   To learn more about what you can do with MyChart, go to [email or web address].    Your next appointment:   3 month(s)  The format for your next appointment:   In Person  Provider:   Belva Crome, MD   Other Instructions NA

## 2021-06-02 NOTE — Progress Notes (Signed)
Cardiology Office Note:    Date:  06/02/2021   ID:  [name], DOB 04/02/1966, MRN [id number]  PCP:  Blane Ohara, MD  Cardiologist:  Garwin Brothers, MD   Referring MD: Blane Ohara, MD    ASSESSMENT:    1. Coronary artery disease involving native coronary artery of native heart without angina pectoris   2. Mixed hyperlipidemia   3. Essential hypertension   4. Hx of CABG    PLAN:    In order of problems listed above:  Coronary artery disease: Secondary prevention stressed with the patient.  Importance of compliance with diet medication stressed any vocalized understanding.  He was advised to walk at least half an hour a day 5 days a week and he promises to do so.  It has been more than a year since his surgery and therefore he will discontinue Plavix when he finishes his bottle of medications. Essential hypertension: Blood pressure stable and diet was emphasized.  Lifestyle modification urged. Mixed dyslipidemia: Lipids were reviewed and diet was emphasized. Patient will be seen in follow-up appointment in 6 months or earlier if the patient has any concerns    Medication Adjustments/Labs and Tests Ordered: Current medicines are reviewed at length with the patient today.  Concerns regarding medicines are outlined above.  No orders of the defined types were placed in this encounter.  No orders of the defined types were placed in this encounter.    No chief complaint on file.    History of Present Illness:    Sean Joseph is a 55 y.o. male Patient has past medical history of coronary artery disease post CABG surgery, essential hypertension and mixed dyslipidemia.  He has abdominal obesity.  He denies any problems at this time and takes care of activities of daily living.  No chest pain orthopnea or PND.  At the time of my evaluation, the patient is alert awake oriented and in no distress.  Past Medical History:  Diagnosis Date   Anginal pain (HCC)     Atherosclerosis of aorta (HCC)    CAD (coronary artery disease) 04/29/2020   Chest pain 02/28/2020   COPD (chronic obstructive pulmonary disease) (HCC)    Coronary artery disease    Essential hypertension 07/23/2020   Generalized anxiety disorder    GERD (gastroesophageal reflux disease)    Hx of CABG 03/01/2020   Hypertension    Mixed dyslipidemia 07/23/2020   Mixed hyperlipidemia    Myocardial infarction (HCC)    Non-ST elevation (NSTEMI) myocardial infarction (HCC)    Tobacco abuse     Past Surgical History:  Procedure Laterality Date   CORONARY ARTERY BYPASS GRAFT N/A 03/01/2020   Procedure: CORONARY ARTERY BYPASS GRAFTING (CABG) X 3, USING BILATERAL MAMMORY ARTERIES, RIGHT LEG GREATER SAPHENOUS VEIN HARVESTED.;  Surgeon: Loreli Slot, MD;  Location: [facility] OR;  Service: Open Heart Surgery;  Laterality: N/A;   HERNIA REPAIR     LEFT HEART CATH AND CORONARY ANGIOGRAPHY N/A 02/29/2020   Procedure: LEFT HEART CATH AND CORONARY ANGIOGRAPHY;  Surgeon: Lyn Records, MD;  Location: [facility] INVASIVE CV LAB;  Service: Cardiovascular;  Laterality: N/A;   TEE WITHOUT CARDIOVERSION N/A 03/01/2020   Procedure: TRANSESOPHAGEAL ECHOCARDIOGRAM (TEE);  Surgeon: Loreli Slot, MD;  Location: [facility] OR;  Service: Open Heart Surgery;  Laterality: N/A;   VIDEO BRONCHOSCOPY WITH INSERTION OF INTERBRONCHIAL VALVE (IBV) N/A 03/08/2020   Procedure: VIDEO BRONCHOSCOPY WITH INSERTION OF INTERBRONCHIAL VALVE (IBV);  Surgeon: Loreli Slot, MD;  Location:  [facility] OR;  Service: Thoracic;  Laterality: N/A;   VIDEO BRONCHOSCOPY WITH INSERTION OF INTERBRONCHIAL VALVE (IBV) N/A 04/18/2020   Procedure: VIDEO BRONCHOSCOPY WITH REMOVAL OF INTERBRONCHIAL VALVE (IBV);  Surgeon: Loreli Slot, MD;  Location: [facility] OR;  Service: Thoracic;  Laterality: N/A;    Current Medications: Current Meds  Medication Sig   albuterol (VENTOLIN HFA) 108 (90 Base) MCG/ACT inhaler Inhale 1-2 puffs into the lungs every 4 (four) hours  as needed for wheezing or shortness of breath.   aspirin 81 MG EC tablet Take 1 tablet (81 mg total) by mouth daily.   atorvastatin (LIPITOR) 80 MG tablet Take 80 mg by mouth daily.   clopidogrel (PLAVIX) 75 MG tablet Take 75 mg by mouth daily.   fluticasone (FLONASE) 50 MCG/ACT nasal spray Place 2 sprays into both nostrils daily.   Fluticasone-Umeclidin-Vilant (TRELEGY ELLIPTA) 100-62.5-25 MCG/INH AEPB Inhale 1 puff into the lungs daily as needed for shortness of breath or wheezing (shortness of breat).   metoprolol succinate (TOPROL-XL) 100 MG 24 hr tablet Take 100 mg by mouth daily.   montelukast (SINGULAIR) 10 MG tablet TAKE 1 TABLET(10 MG) BY MOUTH AT BEDTIME   nitroGLYCERIN (NITROSTAT) 0.4 MG SL tablet Place 0.4 mg under the tongue every 5 (five) minutes as needed for chest pain.   pantoprazole (PROTONIX) 40 MG tablet Take 1 tablet (40 mg total) by mouth daily.     Allergies:   Codeine and Icosapent ethyl   Social History   Socioeconomic History   Marital status: Married    Spouse name: Not on file   Number of children: 3   Years of education: Not on file   Highest education level: Not on file  Occupational History   Not on file  Tobacco Use   Smoking status: Every Day    Packs/day: 1.50    Years: 34.00    Pack years: 51.00    Types: Cigarettes   Smokeless tobacco: Never  Vaping Use   Vaping Use: Never used  Substance and Sexual Activity   Alcohol use: Yes    Comment: socially   Drug use: Never   Sexual activity: Not on file  Other Topics Concern   Not on file  Social History Narrative   Not on file   Social Determinants of Health   Financial Resource Strain: Not on file  Food Insecurity: Not on file  Transportation Needs: Not on file  Physical Activity: Not on file  Stress: Not on file  Social Connections: Not on file     Family History: The patient's family history includes CAD in his father and mother; Esophageal cancer in his father.  ROS:   Please  see the history of present illness.    All other systems reviewed and are negative.  EKGs/Labs/Other Studies Reviewed:    The following studies were reviewed today: I discussed my findings with the patient at length.  EKG reveals sinus rhythm and nonspecific ST-T changes.   Recent Labs: 03/04/2021: ALT 47; BUN 15; Creatinine, Ser 1.13; Hemoglobin 17.4; Platelets 243; Potassium 4.7; Sodium 138; TSH 1.280  Recent Lipid Panel    Component Value Date/Time   CHOL 148 03/04/2021 0820   TRIG 276 (H) 03/04/2021 0820   HDL 27 (L) 03/04/2021 0820   CHOLHDL 5.5 (H) 03/04/2021 0820   CHOLHDL 6.9 02/29/2020 0659   VLDL 46 (H) 02/29/2020 0659   LDLCALC 76 03/04/2021 0820    Physical Exam:    VS:  BP 136/88  Pulse 75   Ht 5\' 7"  (1.702 m)   Wt 219 lb (99.3 kg)   SpO2 96%   BMI 34.30 kg/m     Wt Readings from Last 3 Encounters:  06/02/21 219 lb (99.3 kg)  01/21/21 211 lb (95.7 kg)  09/17/20 207 lb (93.9 kg)     GEN: Patient is in no acute distress HEENT: Normal NECK: No JVD; No carotid bruits LYMPHATICS: No lymphadenopathy CARDIAC: Hear sounds regular, 2/6 systolic murmur at the apex. RESPIRATORY:  Clear to auscultation without rales, wheezing or rhonchi  ABDOMEN: Soft, non-tender, non-distended MUSCULOSKELETAL:  No edema; No deformity  SKIN: Warm and dry NEUROLOGIC:  Alert and oriented x 3 PSYCHIATRIC:  Normal affect   Signed, 13/02/21, MD  06/02/2021 8:20 AM    [facility]

## 2021-06-03 ENCOUNTER — Ambulatory Visit: Payer: BC Managed Care – PPO | Admitting: Thoracic Surgery (Cardiothoracic Vascular Surgery)

## 2021-06-03 ENCOUNTER — Inpatient Hospital Stay: Admission: RE | Admit: 2021-06-03 | Payer: BC Managed Care – PPO | Source: Ambulatory Visit

## 2021-06-09 ENCOUNTER — Telehealth: Payer: Self-pay | Admitting: Cardiology

## 2021-06-09 ENCOUNTER — Other Ambulatory Visit: Payer: Self-pay | Admitting: Cardiology

## 2021-06-09 MED ORDER — ATORVASTATIN CALCIUM 80 MG PO TABS: 80.0000 mg | ORAL_TABLET | Freq: Every day | ORAL | 3 refills | Status: DC

## 2021-06-09 NOTE — Telephone Encounter (Signed)
Refill sent in per request.  

## 2021-06-09 NOTE — Telephone Encounter (Signed)
*  STAT* If patient is at the pharmacy, call can be transferred to refill team.   1. Which medications need to be refilled? (please list name of each medication and dose if known) atorvastatin (LIPITOR) 80 MG tablet  2. Which pharmacy/location (including street and city if local pharmacy) is medication to be sent to? [facility] [phone] - [place], [place] - [address]  3. Do they need a 30 day or 90 day supply? 90 day   Patient is completely out of medication.

## 2021-06-10 ENCOUNTER — Ambulatory Visit: Payer: BC Managed Care – PPO | Admitting: Thoracic Surgery (Cardiothoracic Vascular Surgery)

## 2021-07-15 ENCOUNTER — Other Ambulatory Visit: Payer: Self-pay

## 2021-07-15 ENCOUNTER — Ambulatory Visit: Payer: BC Managed Care – PPO | Admitting: Physician Assistant

## 2021-07-15 ENCOUNTER — Encounter: Payer: Self-pay | Admitting: Physician Assistant

## 2021-07-15 ENCOUNTER — Ambulatory Visit: Payer: BC Managed Care – PPO | Admitting: Thoracic Surgery (Cardiothoracic Vascular Surgery)

## 2021-07-15 VITALS — BP 126/90 | HR 90 | Resp 20 | Ht 67.0 in | Wt 214.0 lb

## 2021-07-15 DIAGNOSIS — R911 Solitary pulmonary nodule: Secondary | ICD-10-CM

## 2021-07-15 DIAGNOSIS — Z951 Presence of aortocoronary bypass graft: Secondary | ICD-10-CM

## 2021-07-15 NOTE — Progress Notes (Signed)
[address]       [name] [address]             [phone]      HPI: Mr. Giovanelli returns for scheduled follow-up visit  Julian Medina is a 55 year old man with a history of tobacco abuse, reflux, hypertriglyceridemia, bullous emphysema, and coronary artery disease.  He presented back in April with a non-ST elevation MI.  He was found to have severe three-vessel coronary disease.  Dr. Dorris Fetch did coronary artery bypass grafting x3 on 03/01/2020.  His postoperative course was complicated by respiratory failure and a pneumothorax.  He had a prolonged air leak and required IBV placement on 03/08/2020.  They were removed on 04/18/2020.  He still has a little incisional soreness.  He is still smoking.  He says he has been trying to quit.  He has not had any recurrent angina.  Follow-up CT scan performed for questionable pulmonary nodules.   Past Medical History:  Diagnosis Date   Anginal pain (HCC)    COPD (chronic obstructive pulmonary disease) (HCC)    Coronary artery disease    GERD (gastroesophageal reflux disease)    Hypertension    Myocardial infarction [facility])     Current Outpatient Medications  Medication Sig Dispense Refill   albuterol (VENTOLIN HFA) 108 (90 Base) MCG/ACT inhaler Inhale 1-2 puffs into the lungs every 4 (four) hours as needed for wheezing or shortness of breath.     aspirin 81 MG EC tablet Take 1 tablet (81 mg total) by mouth daily.     atorvastatin (LIPITOR) 80 MG tablet Take 1 tablet (80 mg total) by mouth daily at 6 PM. 30 tablet 2   clopidogrel (PLAVIX) 75 MG tablet Take 1 tablet (75 mg total) by mouth daily. 90 tablet 3   fexofenadine-pseudoephedrine (ALLEGRA-D 24) 180-240 MG 24 hr tablet Take 1 tablet by mouth daily.     Fluticasone-Umeclidin-Vilant (TRELEGY ELLIPTA) 100-62.5-25 MCG/INH AEPB Inhale 1 puff into the lungs daily as needed (shortness of breat).      metoprolol succinate (TOPROL-XL) 100 MG 24 hr tablet Take 1 tablet (100  mg total) by mouth daily. Take with or immediately following a meal. (Patient taking differently: Take 100 mg by mouth in the morning. Take with or immediately following a meal.) 90 tablet 3   montelukast (SINGULAIR) 10 MG tablet TAKE 1 TABLET(10 MG) BY MOUTH AT BEDTIME 90 tablet 0   pantoprazole (PROTONIX) 40 MG tablet Take 1 tablet (40 mg total) by mouth daily. 30 tablet 2   No current facility-administered medications for this visit.    Physical Exam   Today's Vitals   07/15/21 1238  BP: 126/90  Pulse: 90  Resp: 20  SpO2: 95%  Weight: 214 lb (97.1 kg)  Height: 5\' 7"  (1.702 m)   Body mass index is 33.65 kg/m.   55 year old man in no acute distress Cv: NSR, no murmur Pulm: CTA bilaterally and in all fields Abd: no tenderness Ext: no edema Wound: well healed  Diagnostic Tests:  CT scan:   Done at [facility].   Impression:  No evidence of PE No evidence of cardiopulmonary disease Emphysema     CT CHEST WITHOUT CONTRAST   TECHNIQUE: Multidetector CT imaging of the chest was performed following the standard protocol without IV contrast.   COMPARISON:  Numerous recent chest films. No prior chest CTs.   FINDINGS: Cardiovascular: The heart is normal in size. No pericardial effusion. The aorta is normal  in caliber. Scattered atherosclerotic calcifications. Surgical changes from coronary artery bypass surgery. Scattered coronary artery calcifications.   Mediastinum/Nodes: No mediastinal or hilar mass or adenopathy. The esophagus is grossly normal. Postoperative scarring type changes noted in the anterior mediastinum.   Lungs/Pleura: Significant emphysematous changes with paraseptal and centrilobar emphysema. Large bulla noted in the right upper lobe medially with some surrounding scarring changes. Dense lingular bandlike scarring changes accounting for the chest x-ray abnormality. No worrisome pulmonary lesions or pulmonary nodules. No infiltrates or  effusions.   Upper Abdomen: No significant upper abdominal findings.   Musculoskeletal: Surgical changes from open heart surgery with median sternotomy wires. The sternotomy is ununited. The edges are smooth and well corticated.   The thoracic vertebral bodies are normally aligned. No significant bony findings. No chest wall mass, supraclavicular or axillary adenopathy. The thyroid gland is unremarkable.   IMPRESSION: 1. Significant emphysematous changes with paraseptal and centrilobar emphysema. 2. Large bulla in the right upper lobe medially with some surrounding scarring changes. 3. Dense lingular bandlike scarring changes accounting for the chest x-ray abnormality. 4. No worrisome pulmonary lesions or pulmonary nodules. 5. No mediastinal or hilar mass or adenopathy. 6. Surgical changes from open heart surgery with median sternotomy wires. The sternotomy is ununited. 7. Emphysema and aortic atherosclerosis.   Aortic Atherosclerosis (ICD10-I70.0) and Emphysema (ICD10-J43.9).   Aortic Atherosclerosis (ICD10-I70.0) and Emphysema (ICD10-J43.9).     Electronically Signed   By: Rudie Meyer M.D.   On: 05/28/2020 11:14   I personally reviewed the CT images and concur with the findings noted above.  Impression: Sean Joseph is a 55 year old man with a history of tobacco abuse, COPD, bullous emphysema, hypertriglyceridemia, and reflux.  He underwent coronary bypass grafting x3 back in April.  That was complicated by respiratory failure and pneumothorax with a prolonged air leak.  He had IBVs placed in April and then subsequently removed in June.  Today, his CT scan is without worrisome pulmonary nodules. I have reviewed the scan with the patient and wife at the bedside.    Plan:  Quit smoking Return in 1 year with low-dose CT for lung cancer screening  Jari Favre, PA-C [facility] [phone]

## 2021-07-23 ENCOUNTER — Encounter: Payer: Self-pay | Admitting: Nurse Practitioner

## 2021-07-23 ENCOUNTER — Telehealth (INDEPENDENT_AMBULATORY_CARE_PROVIDER_SITE_OTHER): Payer: BC Managed Care – PPO | Admitting: Nurse Practitioner

## 2021-07-23 DIAGNOSIS — J309 Allergic rhinitis, unspecified: Secondary | ICD-10-CM | POA: Diagnosis not present

## 2021-07-23 DIAGNOSIS — J441 Chronic obstructive pulmonary disease with (acute) exacerbation: Secondary | ICD-10-CM

## 2021-07-23 DIAGNOSIS — F17218 Nicotine dependence, cigarettes, with other nicotine-induced disorders: Secondary | ICD-10-CM

## 2021-07-23 DIAGNOSIS — J018 Other acute sinusitis: Secondary | ICD-10-CM

## 2021-07-23 MED ORDER — FLUTICASONE PROPIONATE 50 MCG/ACT NA SUSP: 2.0000 | Freq: Every day | NASAL | 6 refills | Status: AC

## 2021-07-23 MED ORDER — AZITHROMYCIN 250 MG PO TABS: ORAL_TABLET | ORAL | 0 refills | Status: AC

## 2021-07-23 MED ORDER — BUDESONIDE-FORMOTEROL FUMARATE 80-4.5 MCG/ACT IN AERO: 2.0000 | INHALATION_SPRAY | Freq: Two times a day (BID) | RESPIRATORY_TRACT | 3 refills | Status: DC

## 2021-07-23 MED ORDER — ALBUTEROL SULFATE HFA 108 (90 BASE) MCG/ACT IN AERS
2.0000 | INHALATION_SPRAY | Freq: Four times a day (QID) | RESPIRATORY_TRACT | 0 refills | Status: DC | PRN
Start: 2021-07-23 — End: 2022-02-10

## 2021-07-23 NOTE — Progress Notes (Signed)
Virtual Visit via Video Note   This visit type was conducted due to national recommendations for restrictions regarding the COVID-19 Pandemic (e.g. social distancing) in an effort to limit this patient's exposure and mitigate transmission in our community.  Due to his co-morbid illnesses, this patient is at least at moderate risk for complications without adequate follow up.  This format is felt to be most appropriate for this patient at this time.  All issues noted in this document were discussed and addressed.  A limited physical exam was performed with this format.  A verbal consent was obtained for the virtual visit.   Date:  07/23/2021   ID:  Sean Joseph, DOB July 04, 1966, MRN [id number]  Patient Location: Home (on vacation in [place], [place]) Provider Location: Office/Clinic  PCP:  Blane Ohara, MD   Evaluation Performed:  Est  Chief Complaint:  Sinus pain  History of Present Illness:    Sean Joseph is a 55 y.o. male with sinus congestion/pain/pressure, low-grade fever, bilateral ear pain, and headache. Onset of symptoms was one-week-ago. Treatment has included Claritin-D and Benadryl.He has COPD and is a daily cigarette smoker. States he has experienced some cough and dyspnea with exertion. Denies chest pain or respiratory distress. States home COVID-19 was negative 5-days-ago. Denies known exposure to COVID-19. He tells me his spouse and adult son have similar URI symptoms.   The patient does have symptoms concerning for COVID-19 infection (fever, chills, cough, or new shortness of breath).    Past Medical History:  Diagnosis Date   Anginal pain (HCC)    Atherosclerosis of aorta (HCC)    CAD (coronary artery disease) 04/29/2020   Chest pain 02/28/2020   COPD (chronic obstructive pulmonary disease) (HCC)    Coronary artery disease    Essential hypertension 07/23/2020   Generalized anxiety disorder    GERD (gastroesophageal reflux disease)    Hx of CABG 03/01/2020    Hypertension    Mixed dyslipidemia 07/23/2020   Mixed hyperlipidemia    Myocardial infarction (HCC)    Non-ST elevation (NSTEMI) myocardial infarction (HCC)    Tobacco abuse     Past Surgical History:  Procedure Laterality Date   CORONARY ARTERY BYPASS GRAFT N/A 03/01/2020   Procedure: CORONARY ARTERY BYPASS GRAFTING (CABG) X 3, USING BILATERAL MAMMORY ARTERIES, RIGHT LEG GREATER SAPHENOUS VEIN HARVESTED.;  Surgeon: Loreli Slot, MD;  Location: [facility] OR;  Service: Open Heart Surgery;  Laterality: N/A;   HERNIA REPAIR     LEFT HEART CATH AND CORONARY ANGIOGRAPHY N/A 02/29/2020   Procedure: LEFT HEART CATH AND CORONARY ANGIOGRAPHY;  Surgeon: Lyn Records, MD;  Location: [facility] INVASIVE CV LAB;  Service: Cardiovascular;  Laterality: N/A;   TEE WITHOUT CARDIOVERSION N/A 03/01/2020   Procedure: TRANSESOPHAGEAL ECHOCARDIOGRAM (TEE);  Surgeon: Loreli Slot, MD;  Location: [facility] OR;  Service: Open Heart Surgery;  Laterality: N/A;   VIDEO BRONCHOSCOPY WITH INSERTION OF INTERBRONCHIAL VALVE (IBV) N/A 03/08/2020   Procedure: VIDEO BRONCHOSCOPY WITH INSERTION OF INTERBRONCHIAL VALVE (IBV);  Surgeon: Loreli Slot, MD;  Location: [facility] OR;  Service: Thoracic;  Laterality: N/A;   VIDEO BRONCHOSCOPY WITH INSERTION OF INTERBRONCHIAL VALVE (IBV) N/A 04/18/2020   Procedure: VIDEO BRONCHOSCOPY WITH REMOVAL OF INTERBRONCHIAL VALVE (IBV);  Surgeon: Loreli Slot, MD;  Location: [facility] OR;  Service: Thoracic;  Laterality: N/A;    Family History  Problem Relation Age of Onset   CAD Mother    CAD Father    Esophageal cancer Father     Social  History   Socioeconomic History   Marital status: Married    Spouse name: Not on file   Number of children: 3   Years of education: Not on file   Highest education level: Not on file  Occupational History   Not on file  Tobacco Use   Smoking status: Every Day    Packs/day: 1.50    Years: 34.00    Pack years: 51.00    Types: Cigarettes   Smokeless  tobacco: Never  Vaping Use   Vaping Use: Never used  Substance and Sexual Activity   Alcohol use: Yes    Comment: socially   Drug use: Never   Sexual activity: Not on file  Other Topics Concern   Not on file  Social History Narrative   Not on file   Social Determinants of Health   Financial Resource Strain: Not on file  Food Insecurity: Not on file  Transportation Needs: Not on file  Physical Activity: Not on file  Stress: Not on file  Social Connections: Not on file  Intimate Partner Violence: Not on file    Outpatient Medications Prior to Visit  Medication Sig Dispense Refill   albuterol (VENTOLIN HFA) 108 (90 Base) MCG/ACT inhaler Inhale 1-2 puffs into the lungs every 4 (four) hours as needed for wheezing or shortness of breath.     aspirin 81 MG EC tablet Take 1 tablet (81 mg total) by mouth daily.     atorvastatin (LIPITOR) 80 MG tablet Take 1 tablet (80 mg total) by mouth daily. 90 tablet 3   fluticasone (FLONASE) 50 MCG/ACT nasal spray Place 2 sprays into both nostrils daily. 16 g 6   Fluticasone-Umeclidin-Vilant (TRELEGY ELLIPTA) 100-62.5-25 MCG/INH AEPB Inhale 1 puff into the lungs daily as needed for shortness of breath or wheezing (shortness of breat).     metoprolol succinate (TOPROL-XL) 100 MG 24 hr tablet Take 100 mg by mouth daily.     montelukast (SINGULAIR) 10 MG tablet TAKE 1 TABLET(10 MG) BY MOUTH AT BEDTIME 90 tablet 3   nitroGLYCERIN (NITROSTAT) 0.4 MG SL tablet Place 0.4 mg under the tongue every 5 (five) minutes as needed for chest pain.     pantoprazole (PROTONIX) 40 MG tablet Take 1 tablet (40 mg total) by mouth daily. 30 tablet 2   No facility-administered medications prior to visit.    Allergies:   Codeine and Icosapent ethyl   Social History   Tobacco Use   Smoking status: Every Day    Packs/day: 1.50    Years: 34.00    Pack years: 51.00    Types: Cigarettes   Smokeless tobacco: Never  Vaping Use   Vaping Use: Never used  Substance Use  Topics   Alcohol use: Yes    Comment: socially   Drug use: Never     Review of Systems  Constitutional:  Positive for fever and malaise/fatigue.  HENT:  Positive for congestion, ear pain (bilateral ear fullness,popping) and sinus pain.   Eyes: Negative.   Respiratory:  Positive for cough, shortness of breath and wheezing.   Cardiovascular:  Negative for chest pain, palpitations, orthopnea and leg swelling.  Gastrointestinal: Negative.   Genitourinary: Negative.   Musculoskeletal: Negative.   Skin:  Negative for rash.  Neurological:  Positive for headaches.  Endo/Heme/Allergies:  Positive for environmental allergies.  Psychiatric/Behavioral: Negative.      Labs/Other Tests and Data Reviewed:    Recent Labs: 03/04/2021: ALT 47; BUN 15; Creatinine, Ser 1.13; Hemoglobin 17.4; Platelets 243;  Potassium 4.7; Sodium 138; TSH 1.280   Recent Lipid Panel Lab Results  Component Value Date/Time   CHOL 148 03/04/2021 08:20 AM   TRIG 276 (H) 03/04/2021 08:20 AM   HDL 27 (L) 03/04/2021 08:20 AM   CHOLHDL 5.5 (H) 03/04/2021 08:20 AM   CHOLHDL 6.9 02/29/2020 06:59 AM   LDLCALC 76 03/04/2021 08:20 AM    Wt Readings from Last 3 Encounters:  07/15/21 214 lb (97.1 kg)  06/02/21 219 lb (99.3 kg)  01/21/21 211 lb (95.7 kg)     Objective:    Vital Signs:     Physical Exam   ASSESSMENT & PLAN:     1. Acute non-recurrent sinusitis of other sinus - azithromycin (ZITHROMAX) 250 MG tablet; Take 2 tablets on day 1, then 1 tablet daily on days 2 through 5  Dispense: 6 tablet; Refill: 0 - fluticasone (FLONASE) 50 MCG/ACT nasal spray; Place 2 sprays into both nostrils daily.  Dispense: 16 g; Refill: 6  2. Chronic obstructive pulmonary disease with acute exacerbation (HCC) - budesonide-formoterol (SYMBICORT) 80-4.5 MCG/ACT inhaler; Inhale 2 puffs into the lungs 2 (two) times daily.  Dispense: 1 each; Refill: 3 - albuterol (VENTOLIN HFA) 108 (90 Base) MCG/ACT inhaler; Inhale 2 puffs into the lungs  every 6 (six) hours as needed for wheezing or shortness of breath.  Dispense: 8 g; Refill: 0  3. Cigarette nicotine dependence with other nicotine-induced disorder -recommend smoking cessation  4. Chronic allergic rhinitis - fluticasone (FLONASE) 50 MCG/ACT nasal spray; Place 2 sprays into both nostrils daily.  Dispense: 16 g; Refill: 6    Rest and push fluids Seek emergency medical care for severe or concerning symptoms Notify office if symptoms fail to improve or worsen Follow-up as needed   COVID-19 Education: The signs and symptoms of COVID-19 were discussed with the patient and how to seek care for testing (follow up with PCP or arrange E-visit). The importance of social distancing was discussed today.   I spent 10 minutes dedicated to the care of this patient on the date of this encounter to include face-to-face time with the patient, as well as: EMR and prescription medication management.  Follow Up:  In Person prn  Signed, Janie Morning, NP  07/23/2021 10:06 AM    [facility]

## 2021-07-29 ENCOUNTER — Other Ambulatory Visit: Payer: Self-pay | Admitting: Family Medicine

## 2021-09-10 ENCOUNTER — Ambulatory Visit: Payer: BC Managed Care – PPO | Admitting: Cardiology

## 2021-09-22 ENCOUNTER — Encounter: Payer: BC Managed Care – PPO | Admitting: Nurse Practitioner

## 2021-10-08 ENCOUNTER — Ambulatory Visit: Payer: BC Managed Care – PPO | Admitting: Cardiology

## 2021-11-07 IMAGING — DX DG CHEST 1V PORT
1 series · 1 of 1 positions shown · non-contrast
Comparison: Most recent radiograph 03/01/2020

CLINICAL DATA: Chest tube in place, respiratory distress

EXAM:
PORTABLE CHEST 1 VIEW

[chest ap]
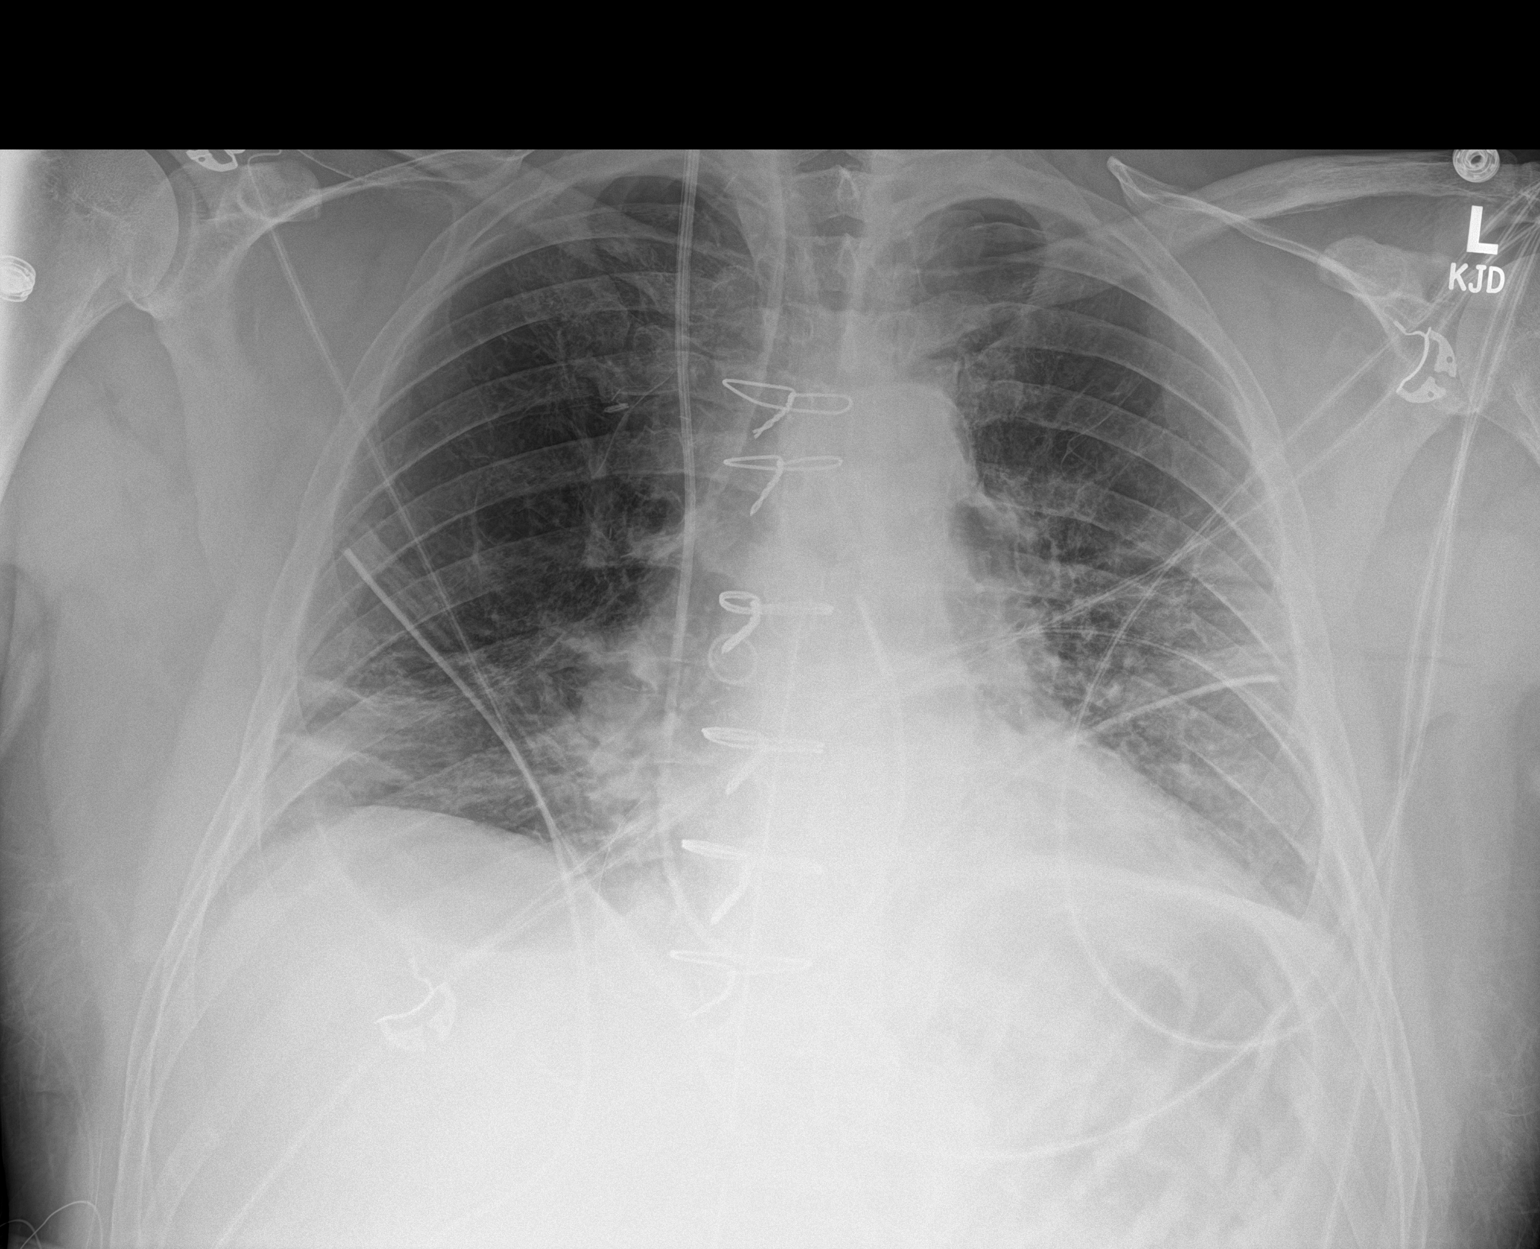

[1 of 1 positions shown; findings below may reference images not displayed]

FINDINGS: A right IJ catheter sheath is in place through which passes a
Swan-Ganz catheter with the tip positioned near the level of the
pulmonary trunk. There are postsurgical changes from sternotomy
including reinforce lower sternal sutures which appear intact and
aligned. In epicardial pacer wires are present. A mediastinal and
bilateral pleural drains are in place. Telemetry leads overlie the
chest. Transesophageal and endotracheal tubes have since been
removed from the comparison exam.

There is persistent bandlike and hazy basilar opacities in the lung
bases most likely reflecting atelectatic change. No pneumothorax or
visible effusion. Pulmonary vascularity is well-defined.
Cardiomediastinal contours are similar to prior exam with a
calcified aorta. Surgical clips project over the cardiomediastinal
silhouette. No acute osseous or soft tissue abnormality.
IMPRESSION: 1. Support lines and tubes as above.
2. Persistent bandlike and hazy basilar opacities in the lung bases
compatible with residual atelectatic changes.
3. Otherwise stable postsurgical changes post sternotomy and CABG.
4. No pneumothorax or visible effusion.

## 2021-12-01 IMAGING — DX DG CHEST 2V SAME DAY
2 series · 2 of 2 positions shown · non-contrast
Comparison: Chest x-ray 03/26/2020.

CLINICAL DATA: 53-year-old male with history of CABG on 03/01/2020
status post chest tube removal.

EXAM:
CHEST - 2 VIEW SAME DAY

[dg chest 2v repeat same day (1 of 2)]
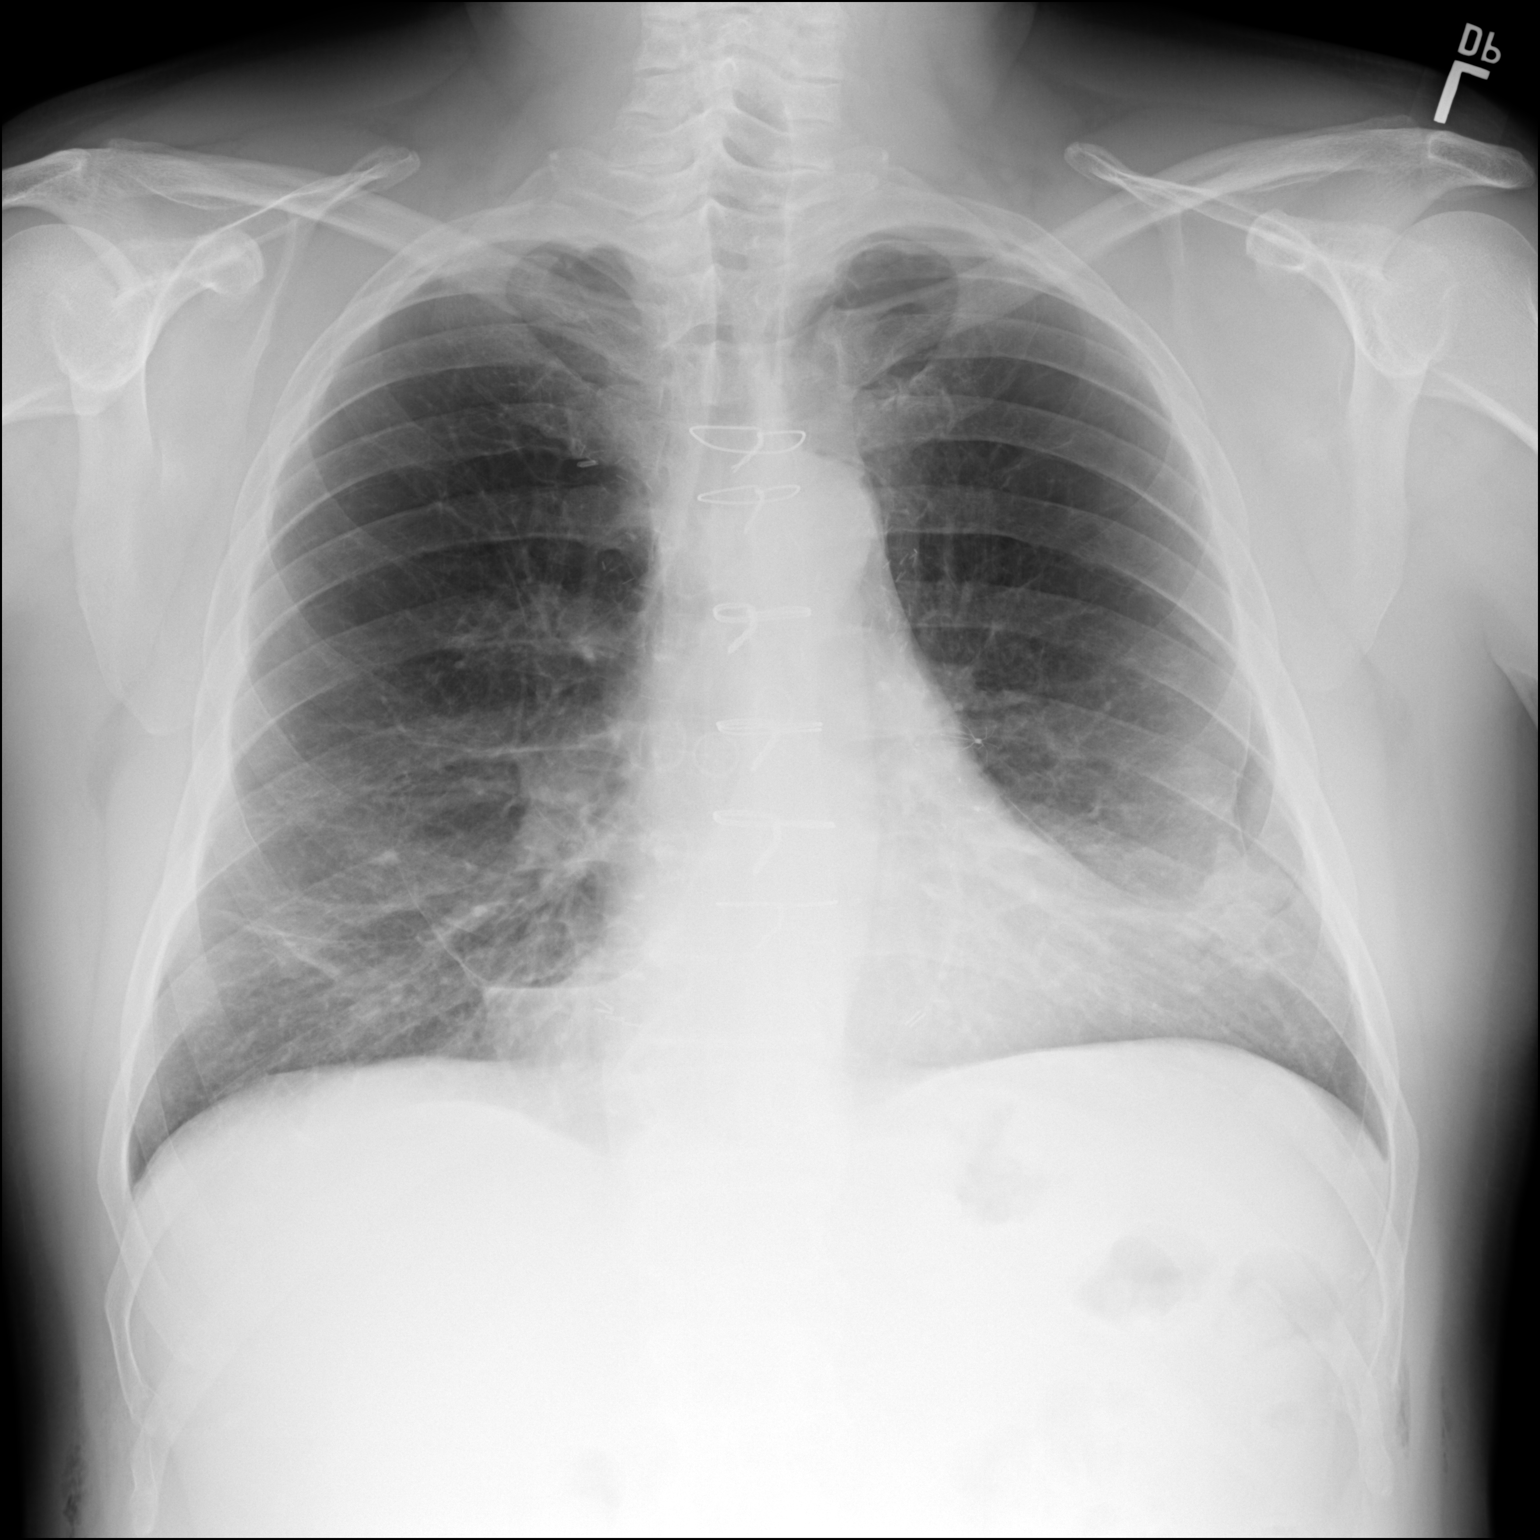

[dg chest 2v repeat same day (2 of 2)]
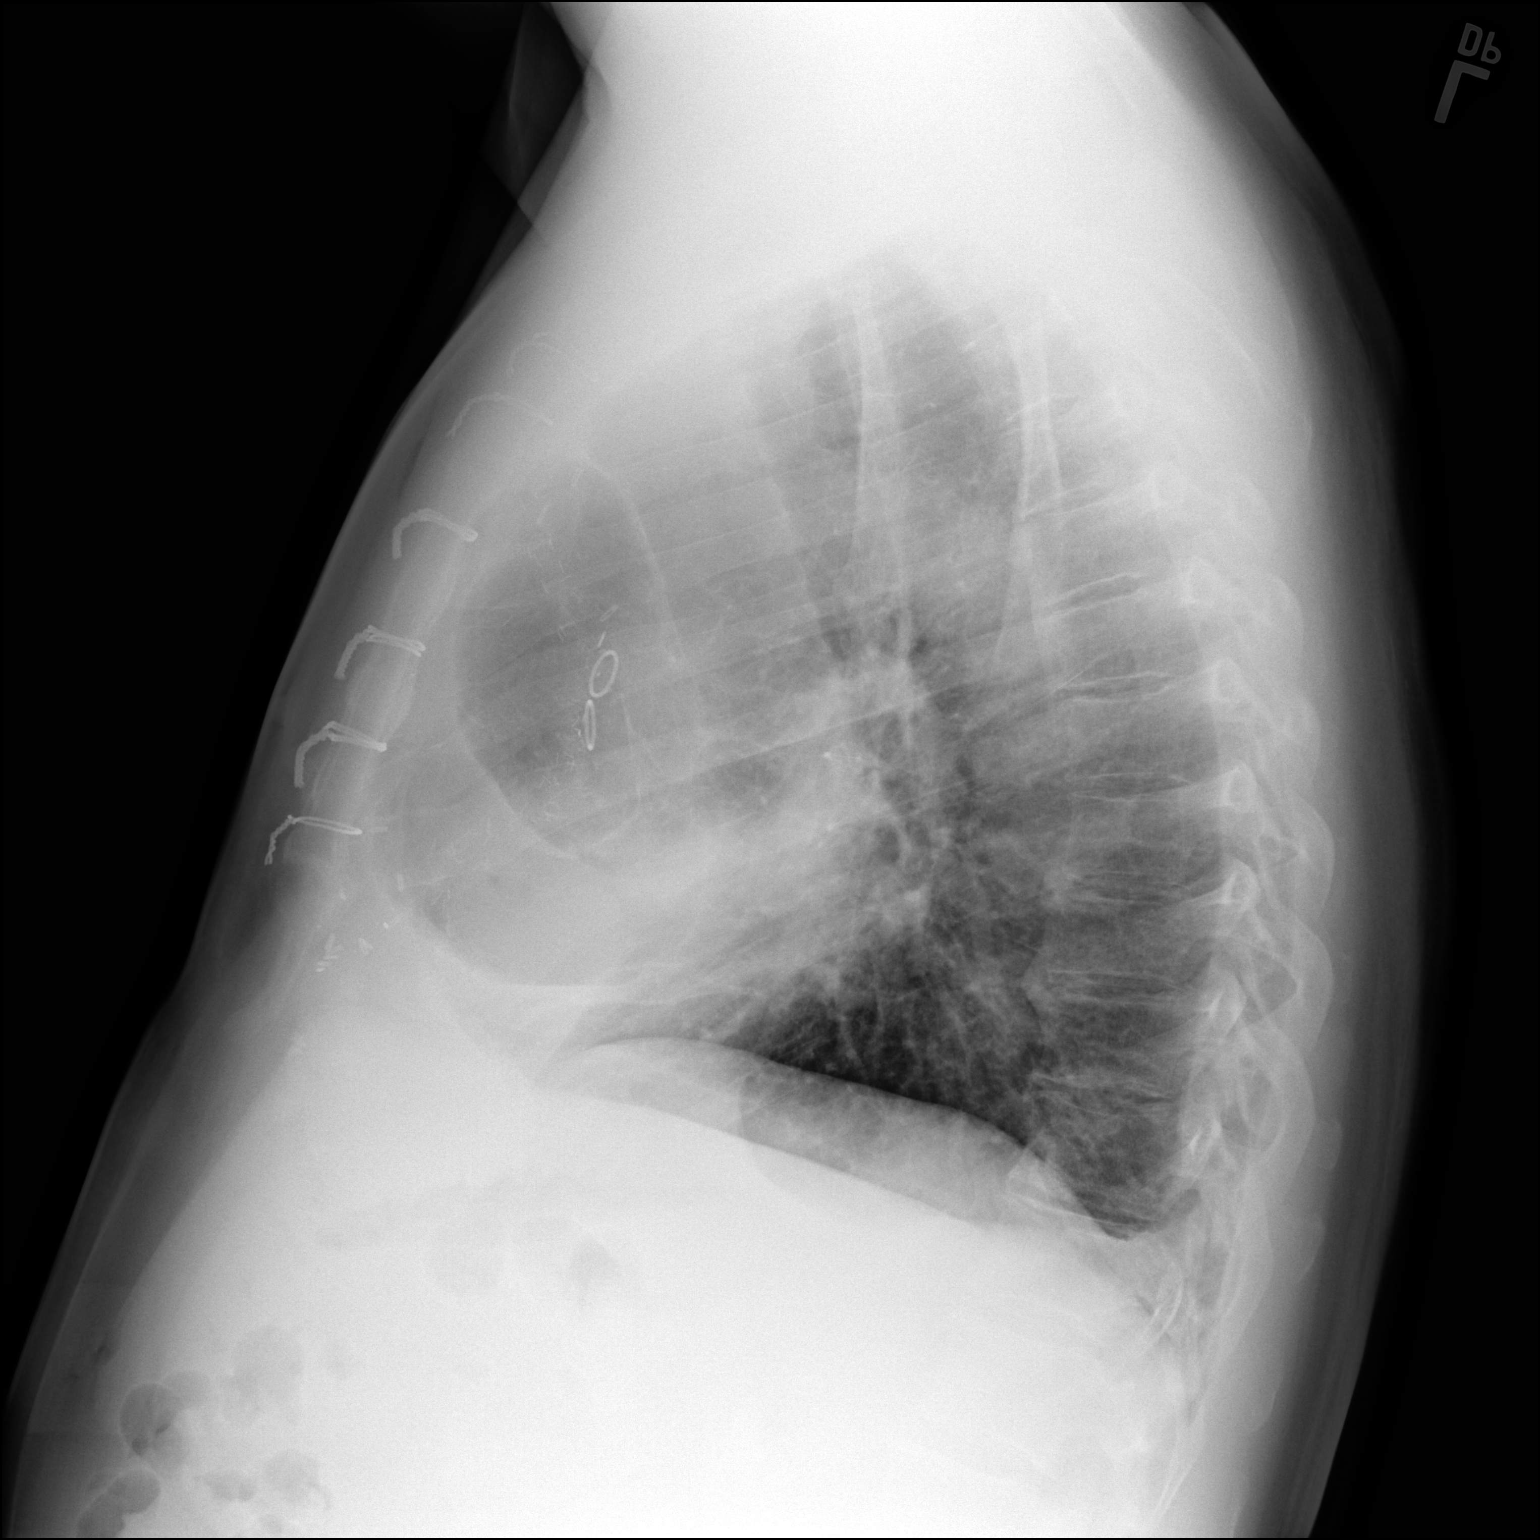

[2 of 2 positions shown; findings below may reference images not displayed]

FINDINGS: Previously noted left-sided chest tube has been removed. Small
left-sided pneumothorax again noted in the lateral aspect of the
left mid hemithorax, unchanged. Trace amount of pneumomediastinum.
Areas of architectural distortion noted in the lung bases
bilaterally, likely to reflect resolving areas of postoperative
subsegmental atelectasis and/or scarring. No acute consolidative
airspace disease. No pleural effusions. No evidence of pulmonary
edema. Heart size is normal. Upper mediastinal contours are within
normal limits. Status post median sternotomy for CABG. Bronchial
occluder device projecting over the left hilar region.
IMPRESSION: 1. Left-sided chest tube has been removed. Small left-sided
pneumothorax and trace amount of pneumomediastinum, similar to the
prior study.
2. Bibasilar areas of subsegmental atelectasis and/or scarring,
similar to the prior study.

## 2021-12-01 IMAGING — CR DG CHEST 2V
2 series · 2 of 2 positions shown · non-contrast
Comparison: 03/14/2020

CLINICAL DATA: Post CABG

EXAM:
CHEST - 2 VIEW

[w chest pa]
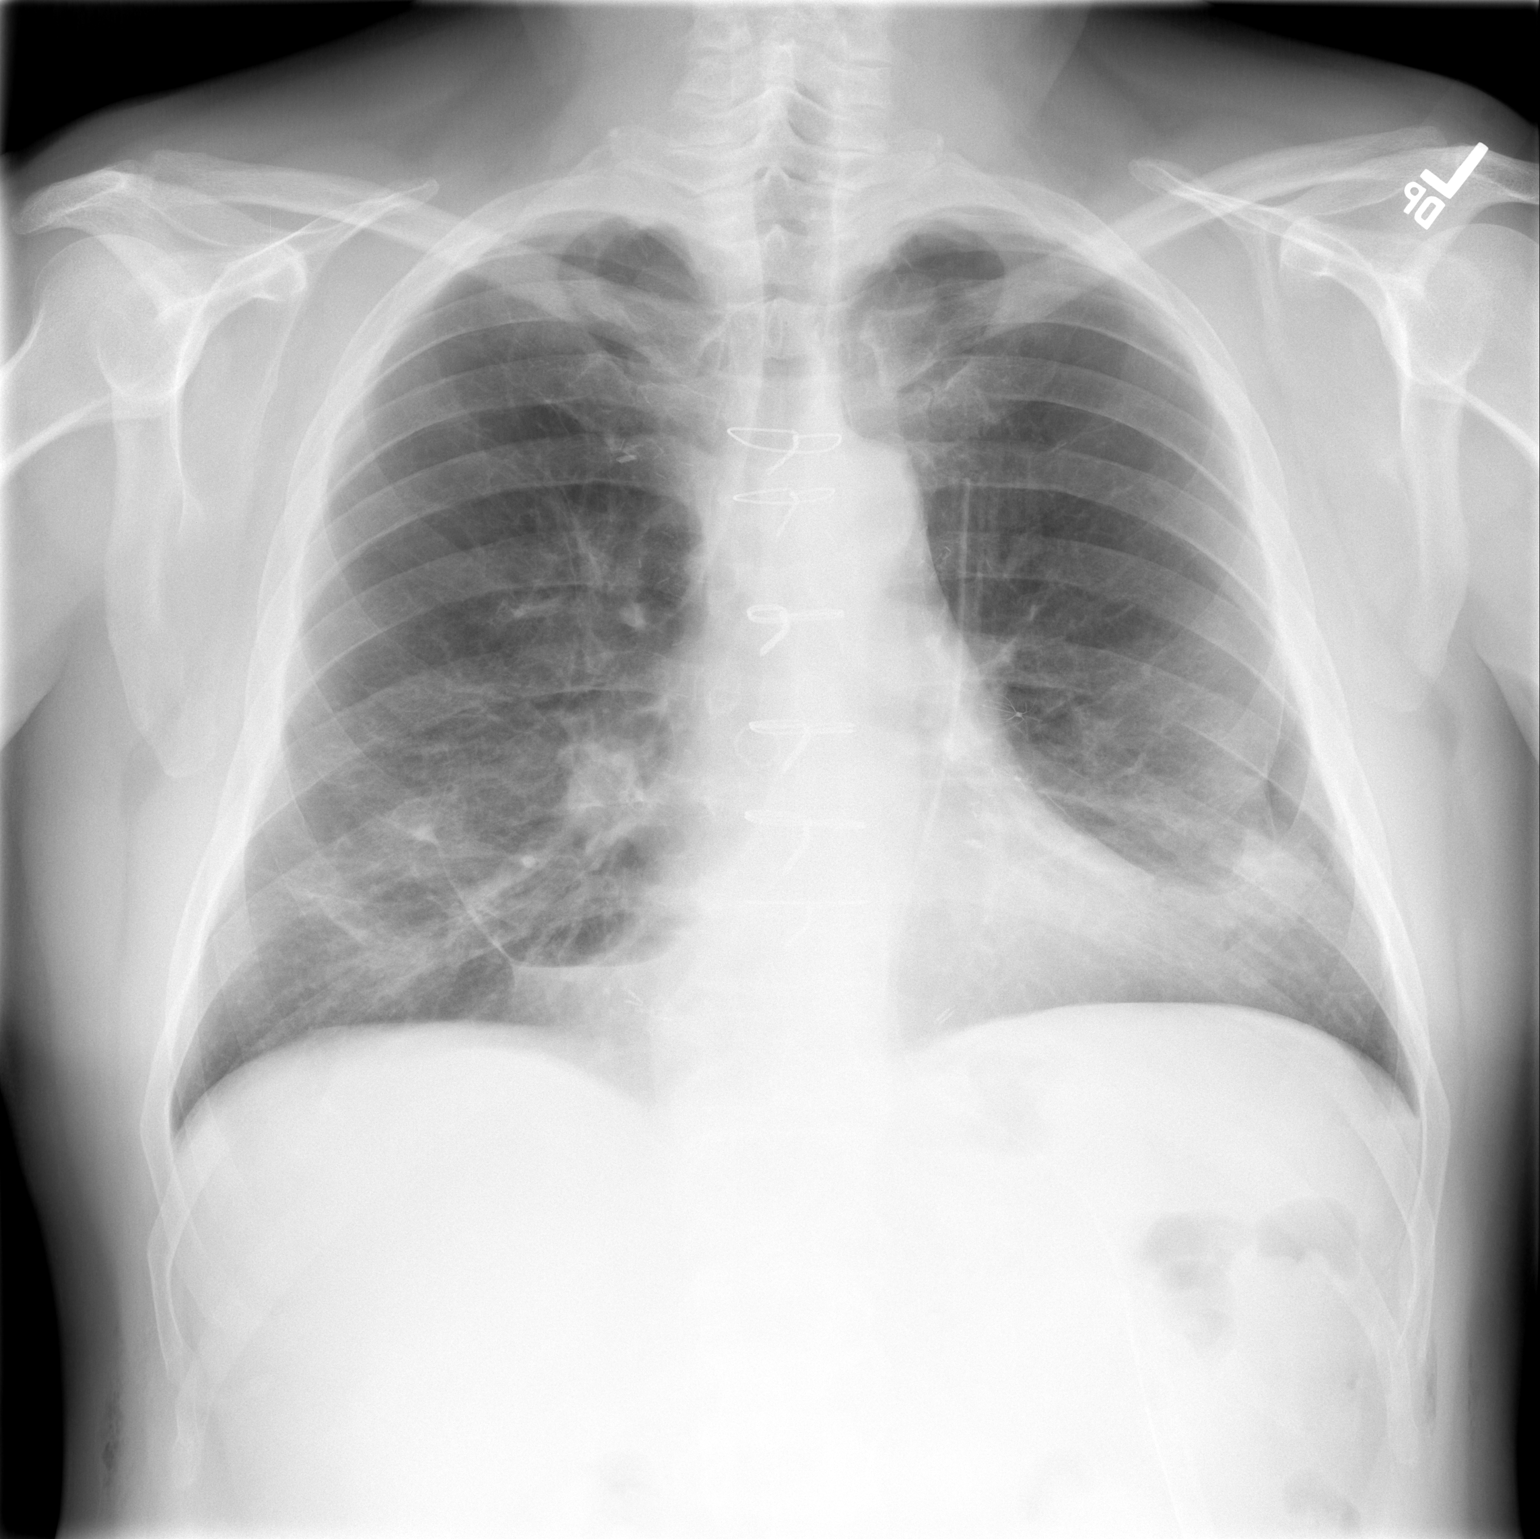

[w chest lat]
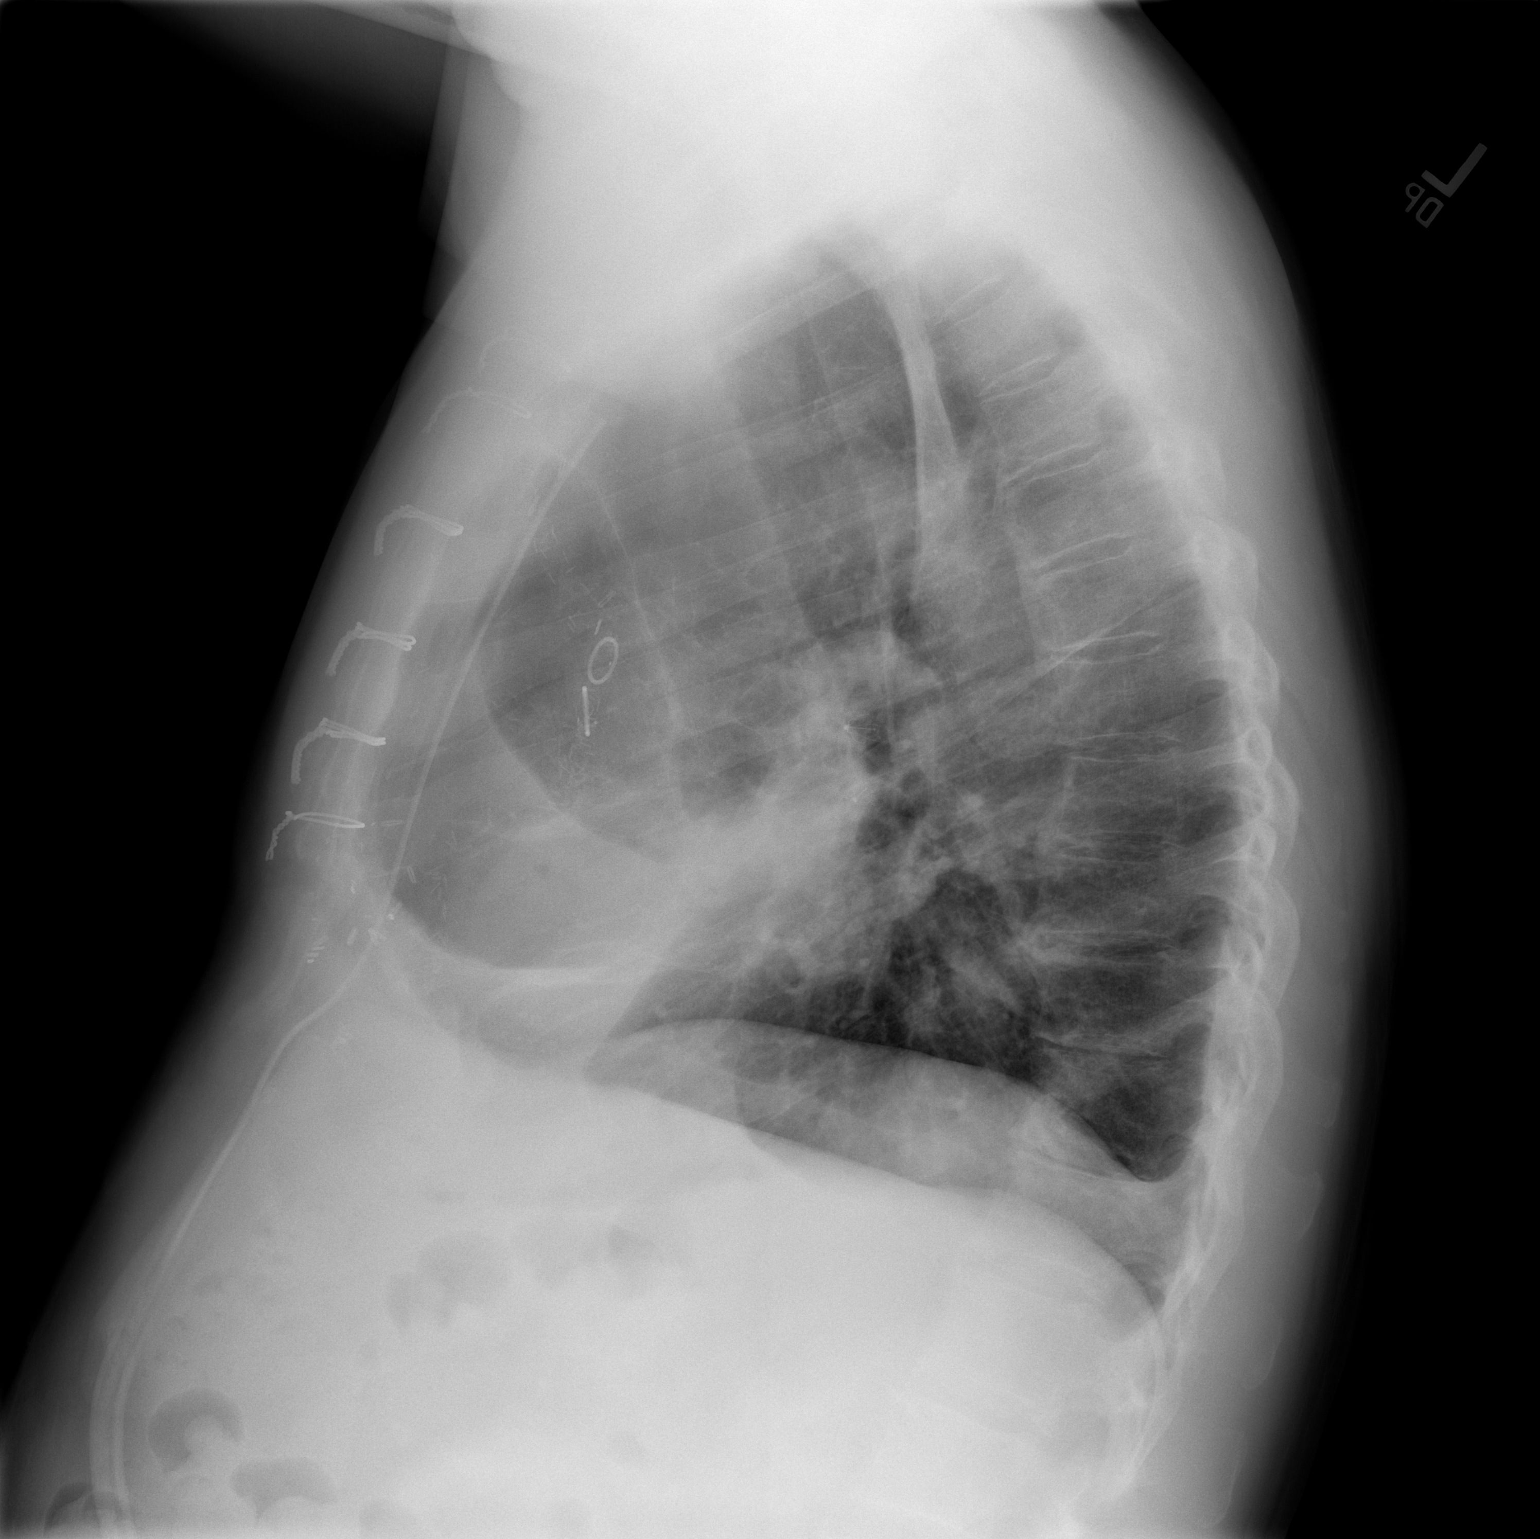

[2 of 2 positions shown; findings below may reference images not displayed]

FINDINGS: LEFT thoracostomy tube again identified.

Normal heart size post CABG.

Stable mediastinal contours and pulmonary vascularity.

Bibasilar atelectasis versus scarring.

Small loculated pneumothorax at lateral LEFT lung base again
identified.

Endobronchial valves noted at mid inferior LEFT hilum.

No pleural effusion identified.
IMPRESSION: Small loculated LEFT pneumothorax at lateral lung base despite
thoracostomy tube.

Bibasilar atelectasis.

These results will be called to the ordering clinician or
representative by the Radiologist Assistant, and communication
documented in the PACS or [REDACTED].

## 2021-12-11 ENCOUNTER — Ambulatory Visit: Payer: BC Managed Care – PPO | Admitting: Cardiology

## 2022-01-26 ENCOUNTER — Ambulatory Visit: Payer: BC Managed Care – PPO | Admitting: Cardiology

## 2022-02-10 ENCOUNTER — Ambulatory Visit: Payer: BC Managed Care – PPO | Admitting: Cardiology

## 2022-02-10 ENCOUNTER — Other Ambulatory Visit: Payer: Self-pay

## 2022-02-10 ENCOUNTER — Encounter: Payer: Self-pay | Admitting: Cardiology

## 2022-02-10 VITALS — BP 126/82 | HR 74 | Ht 67.0 in | Wt 214.8 lb

## 2022-02-10 DIAGNOSIS — I2510 Atherosclerotic heart disease of native coronary artery without angina pectoris: Secondary | ICD-10-CM

## 2022-02-10 DIAGNOSIS — Z951 Presence of aortocoronary bypass graft: Secondary | ICD-10-CM

## 2022-02-10 DIAGNOSIS — Z720 Tobacco use: Secondary | ICD-10-CM

## 2022-02-10 DIAGNOSIS — E782 Mixed hyperlipidemia: Secondary | ICD-10-CM | POA: Diagnosis not present

## 2022-02-10 DIAGNOSIS — I10 Essential (primary) hypertension: Secondary | ICD-10-CM | POA: Diagnosis not present

## 2022-02-10 DIAGNOSIS — F17210 Nicotine dependence, cigarettes, uncomplicated: Secondary | ICD-10-CM

## 2022-02-10 MED ORDER — ROSUVASTATIN CALCIUM 10 MG PO TABS: 10.0000 mg | ORAL_TABLET | Freq: Every day | ORAL | 3 refills | Status: DC

## 2022-02-10 NOTE — Patient Instructions (Addendum)
Medication Instructions:  ?Your physician has recommended you make the following change in your medication:  ? ?Stop Atorvastatin. ? ?Once we review your LFT's we will start you on Crestor 10 mg daily. We will let you know! ? ?*If you need a refill on your cardiac medications before your next appointment, please call your pharmacy* ? ? ?Lab Work: ?None ordered ?If you have labs (blood work) drawn today and your tests are completely normal, you will receive your results only by: ?MyChart Message (if you have MyChart) OR ?A paper copy in the mail ?If you have any lab test that is abnormal or we need to change your treatment, we will call you to review the results. ? ? ?Testing/Procedures: ?None ordered ? ? ?Follow-Up: ?At [facility], you and your health needs are our priority.  As part of our continuing mission to provide you with exceptional heart care, we have created designated Provider Care Teams.  These Care Teams include your primary Cardiologist (physician) and Advanced Practice Providers (APPs -  Physician Assistants and Nurse Practitioners) who all work together to provide you with the care you need, when you need it. ? ?We recommend signing up for the patient portal called "MyChart".  Sign up information is provided on this After Visit Summary.  MyChart is used to connect with patients for Virtual Visits (Telemedicine).  Patients are able to view lab/test results, encounter notes, upcoming appointments, etc.  Non-urgent messages can be sent to your provider as well.   ?To learn more about what you can do with MyChart, go to [email or web address].   ? ?Your next appointment:   ?9 month(s) ? ?The format for your next appointment:   ?In Person ? ?Provider:   ?Belva Crome, MD ? ? ?Other Instructions ?NA ? ? ?

## 2022-02-10 NOTE — Addendum Note (Signed)
Addended by: Eleonore Chiquito on: 02/10/2022 01:06 PM ? ? Modules accepted: Orders ? ?

## 2022-02-10 NOTE — Progress Notes (Signed)
?Cardiology Office Note:   ? ?Date:  02/10/2022  ? ?ID:  Sean Joseph, DOB 1966/05/23, MRN [id number] ? ?PCP:  Abner Greenspan, MD  ?Cardiologist:  Garwin Brothers, MD  ? ?Referring MD: Blane Ohara, MD  ? ? ?ASSESSMENT:   ? ?1. Coronary artery disease involving native coronary artery of native heart without angina pectoris   ?2. Essential hypertension   ?3. Mixed hyperlipidemia   ?4. Hx of CABG   ?5. Tobacco abuse   ? ?PLAN:   ? ?In order of problems listed above: ? ?Coronary artery disease: Secondary prevention stressed with the patient.  Importance of compliance with diet medication stressed any vocalized understanding.  He was advised to walk at least half an hour a day 5 days a week and he promises to do so. ?Essential hypertension: Blood pressure stable and diet was emphasized. ?Mixed dyslipidemia: Not on statin therapy.  He could not tolerate atorvastatin.  He is willing to try other medications.  His lipids are markedly elevated.  I will get LFTs from primary care doctor.  We will stop atorvastatin.  He is not taking it anyway.  If LFTs are fine we will initiate him on rosuvastatin 10 mg daily and liver lipid check in 6 weeks.  I discussed this with my nurse at length. ?Obesity: Weight reduction was stressed and diet was emphasized and he promises to do better.  Risks of obesity explained. ?Cigarette smoker: I spent 5 minutes with the patient discussing solely about smoking. Smoking cessation was counseled. I suggested to the patient also different medications and pharmacological interventions. Patient is keen to try stopping on its own at this time. He will get back to me if he needs any further assistance in this matter. ?Patient will be seen in follow-up appointment in 9 months or earlier if the patient has any concerns ? ? ? ?Medication Adjustments/Labs and Tests Ordered: ?Current medicines are reviewed at length with the patient today.  Concerns regarding medicines are outlined above.  ?No orders of the  defined types were placed in this encounter. ? ?No orders of the defined types were placed in this encounter. ? ? ? ?No chief complaint on file. ?  ? ?History of Present Illness:   ? ?Sean Joseph is a 56 y.o. male.  Patient has past medical history of coronary artery disease post CABG surgery, essential hypertension, mixed dyslipidemia and unfortunately continues to smoke.  He tells me that he has cut down his smoking to 3 cigarettes a day.  No chest pain orthopnea or PND.  At the time of my evaluation, the patient is alert awake oriented and in no distress.  He leads a sedentary lifestyle. ? ?Past Medical History:  ?Diagnosis Date  ? Anginal pain (HCC)   ? Atherosclerosis of aorta (HCC)   ? CAD (coronary artery disease) 04/29/2020  ? Chest pain 02/28/2020  ? COPD (chronic obstructive pulmonary disease) (HCC)   ? Coronary artery disease   ? Essential hypertension 07/23/2020  ? Generalized anxiety disorder   ? GERD (gastroesophageal reflux disease)   ? Hx of CABG 03/01/2020  ? Hypertension   ? Mixed dyslipidemia 07/23/2020  ? Mixed hyperlipidemia   ? Myocardial infarction [facility])   ? Non-ST elevation (NSTEMI) myocardial infarction [facility])   ? Tobacco abuse   ? ? ?Past Surgical History:  ?Procedure Laterality Date  ? CORONARY ARTERY BYPASS GRAFT N/A 03/01/2020  ? Procedure: CORONARY ARTERY BYPASS GRAFTING (CABG) X 3, USING BILATERAL MAMMORY ARTERIES, RIGHT LEG GREATER  SAPHENOUS VEIN HARVESTED.;  Surgeon: Loreli Slot, MD;  Location: [facility] OR;  Service: Open Heart Surgery;  Laterality: N/A;  ? HERNIA REPAIR    ? LEFT HEART CATH AND CORONARY ANGIOGRAPHY N/A 02/29/2020  ? Procedure: LEFT HEART CATH AND CORONARY ANGIOGRAPHY;  Surgeon: Lyn Records, MD;  Location: [facility] INVASIVE CV LAB;  Service: Cardiovascular;  Laterality: N/A;  ? TEE WITHOUT CARDIOVERSION N/A 03/01/2020  ? Procedure: TRANSESOPHAGEAL ECHOCARDIOGRAM (TEE);  Surgeon: Loreli Slot, MD;  Location: [facility] OR;  Service: Open Heart Surgery;  Laterality: N/A;  ?  VIDEO BRONCHOSCOPY WITH INSERTION OF INTERBRONCHIAL VALVE (IBV) N/A 03/08/2020  ? Procedure: VIDEO BRONCHOSCOPY WITH INSERTION OF INTERBRONCHIAL VALVE (IBV);  Surgeon: Loreli Slot, MD;  Location: [facility] OR;  Service: Thoracic;  Laterality: N/A;  ? VIDEO BRONCHOSCOPY WITH INSERTION OF INTERBRONCHIAL VALVE (IBV) N/A 04/18/2020  ? Procedure: VIDEO BRONCHOSCOPY WITH REMOVAL OF INTERBRONCHIAL VALVE (IBV);  Surgeon: Loreli Slot, MD;  Location: [facility] OR;  Service: Thoracic;  Laterality: N/A;  ? ? ?Current Medications: ?Current Meds  ?Medication Sig  ? albuterol (PROVENTIL) (2.5 MG/3ML) 0.083% nebulizer solution Inhale 3 mLs into the lungs every 6 (six) hours as needed for wheezing or shortness of breath.  ? albuterol (VENTOLIN HFA) 108 (90 Base) MCG/ACT inhaler Inhale 1-2 puffs into the lungs every 4 (four) hours as needed for wheezing or shortness of breath.  ? aspirin 81 MG EC tablet Take 1 tablet (81 mg total) by mouth daily.  ? atorvastatin (LIPITOR) 80 MG tablet Take 1 tablet (80 mg total) by mouth daily.  ? budesonide-formoterol (SYMBICORT) 80-4.5 MCG/ACT inhaler Inhale 2 puffs into the lungs 2 (two) times daily.  ? fluticasone (FLONASE) 50 MCG/ACT nasal spray Place 2 sprays into both nostrils daily.  ? Fluticasone-Umeclidin-Vilant (TRELEGY ELLIPTA) 100-62.5-25 MCG/INH AEPB Inhale 1 puff into the lungs daily as needed for shortness of breath or wheezing (shortness of breat).  ? metoprolol succinate (TOPROL-XL) 100 MG 24 hr tablet Take 100 mg by mouth daily.  ? montelukast (SINGULAIR) 10 MG tablet TAKE 1 TABLET(10 MG) BY MOUTH AT BEDTIME  ? nitroGLYCERIN (NITROSTAT) 0.4 MG SL tablet Place 0.4 mg under the tongue every 5 (five) minutes as needed for chest pain.  ? pantoprazole (PROTONIX) 40 MG tablet Take 1 tablet (40 mg total) by mouth daily.  ?  ? ?Allergies:   Codeine and Icosapent ethyl  ? ?Social History  ? ?Socioeconomic History  ? Marital status: Married  ?  Spouse name: Not on file  ? Number of  children: 3  ? Years of education: Not on file  ? Highest education level: Not on file  ?Occupational History  ? Not on file  ?Tobacco Use  ? Smoking status: Every Day  ?  Packs/day: 1.50  ?  Years: 34.00  ?  Pack years: 51.00  ?  Types: Cigarettes  ? Smokeless tobacco: Never  ?Vaping Use  ? Vaping Use: Never used  ?Substance and Sexual Activity  ? Alcohol use: Yes  ?  Comment: socially  ? Drug use: Never  ? Sexual activity: Not on file  ?Other Topics Concern  ? Not on file  ?Social History Narrative  ? Not on file  ? ?Social Determinants of Health  ? ?Financial Resource Strain: Not on file  ?Food Insecurity: Not on file  ?Transportation Needs: Not on file  ?Physical Activity: Not on file  ?Stress: Not on file  ?Social Connections: Not on file  ?  ? ?Family History: ?  The patient's family history includes CAD in his father and mother; Esophageal cancer in his father. ? ?ROS:   ?Please see the history of present illness.    ?All other systems reviewed and are negative. ? ?EKGs/Labs/Other Studies Reviewed:   ? ?The following studies were reviewed today: ?I discussed my findings with the patient at length ? ? ?Recent Labs: ?03/04/2021: ALT 47; BUN 15; Creatinine, Ser 1.13; Hemoglobin 17.4; Platelets 243; Potassium 4.7; Sodium 138; TSH 1.280  ?Recent Lipid Panel ?   ?Component Value Date/Time  ? CHOL 148 03/04/2021 0820  ? TRIG 276 (H) 03/04/2021 0820  ? HDL 27 (L) 03/04/2021 0820  ? CHOLHDL 5.5 (H) 03/04/2021 0820  ? CHOLHDL 6.9 02/29/2020 0659  ? VLDL 46 (H) 02/29/2020 0659  ? LDLCALC 76 03/04/2021 0820  ? ? ?Physical Exam:   ? ?VS:  BP 126/82   Pulse 74   Ht 5\' 7"  (1.702 m)   Wt 214 lb 12.8 oz (97.4 kg)   SpO2 96%   BMI 33.64 kg/m?    ? ?Wt Readings from Last 3 Encounters:  ?02/10/22 214 lb 12.8 oz (97.4 kg)  ?07/15/21 214 lb (97.1 kg)  ?06/02/21 219 lb (99.3 kg)  ?  ? ?GEN: Patient is in no acute distress ?HEENT: Normal ?NECK: No JVD; No carotid bruits ?LYMPHATICS: No lymphadenopathy ?CARDIAC: Hear sounds  regular, 2/6 systolic murmur at the apex. ?RESPIRATORY:  Clear to auscultation without rales, wheezing or rhonchi  ?ABDOMEN: Soft, non-tender, non-distended ?MUSCULOSKELETAL:  No edema; No deformity  ?SKIN: Warm and d

## 2022-03-05 ENCOUNTER — Other Ambulatory Visit: Payer: Self-pay | Admitting: Cardiology

## 2022-05-21 ENCOUNTER — Other Ambulatory Visit: Payer: Self-pay | Admitting: Thoracic Surgery (Cardiothoracic Vascular Surgery)

## 2022-05-21 DIAGNOSIS — J449 Chronic obstructive pulmonary disease, unspecified: Secondary | ICD-10-CM

## 2022-07-14 ENCOUNTER — Ambulatory Visit: Payer: BC Managed Care – PPO

## 2022-07-14 ENCOUNTER — Other Ambulatory Visit: Payer: BC Managed Care – PPO

## 2022-11-25 ENCOUNTER — Encounter: Payer: Self-pay | Admitting: Cardiology

## 2022-11-25 ENCOUNTER — Ambulatory Visit: Payer: PRIVATE HEALTH INSURANCE | Attending: Cardiology | Admitting: Cardiology

## 2022-11-25 VITALS — BP 148/98 | HR 91 | Ht 67.0 in | Wt 207.4 lb

## 2022-11-25 DIAGNOSIS — I10 Essential (primary) hypertension: Secondary | ICD-10-CM | POA: Diagnosis not present

## 2022-11-25 DIAGNOSIS — I2510 Atherosclerotic heart disease of native coronary artery without angina pectoris: Secondary | ICD-10-CM | POA: Diagnosis not present

## 2022-11-25 DIAGNOSIS — Z951 Presence of aortocoronary bypass graft: Secondary | ICD-10-CM | POA: Diagnosis not present

## 2022-11-25 DIAGNOSIS — Z720 Tobacco use: Secondary | ICD-10-CM

## 2022-11-25 DIAGNOSIS — I700 Atherosclerosis of aorta: Secondary | ICD-10-CM | POA: Diagnosis not present

## 2022-11-25 DIAGNOSIS — E782 Mixed hyperlipidemia: Secondary | ICD-10-CM

## 2022-11-25 DIAGNOSIS — F17210 Nicotine dependence, cigarettes, uncomplicated: Secondary | ICD-10-CM

## 2022-11-25 LAB — COMPREHENSIVE METABOLIC PANEL WITH GFR
ALT: 35 [IU]/L (ref 0–44)
AST: 28 [IU]/L (ref 0–40)
Albumin/Globulin Ratio: 1.5 (ref 1.2–2.2)
Albumin: 4.6 g/dL (ref 3.8–4.9)
Alkaline Phosphatase: 92 [IU]/L (ref 44–121)
BUN/Creatinine Ratio: 12 (ref 9–20)
BUN: 11 mg/dL (ref 6–24)
Bilirubin Total: 0.4 mg/dL (ref 0.0–1.2)
CO2: 23 mmol/L (ref 20–29)
Calcium: 9.9 mg/dL (ref 8.7–10.2)
Chloride: 101 mmol/L (ref 96–106)
Creatinine, Ser: 0.9 mg/dL (ref 0.76–1.27)
Globulin, Total: 3 g/dL (ref 1.5–4.5)
Glucose: 90 mg/dL (ref 70–99)
Potassium: 4.6 mmol/L (ref 3.5–5.2)
Sodium: 139 mmol/L (ref 134–144)
Total Protein: 7.6 g/dL (ref 6.0–8.5)
eGFR: 100 mL/min/{1.73_m2}

## 2022-11-25 LAB — LIPID PANEL
Chol/HDL Ratio: 7.2 ratio — ABNORMAL HIGH (ref 0.0–5.0)
Cholesterol, Total: 210 mg/dL — ABNORMAL HIGH (ref 100–199)
HDL: 29 mg/dL — ABNORMAL LOW (ref >39)
LDL Chol Calc (NIH): 137 mg/dL — ABNORMAL HIGH (ref 0–99)
Triglycerides: 245 mg/dL — ABNORMAL HIGH (ref 0–149)
VLDL Cholesterol Cal: 44 mg/dL — ABNORMAL HIGH (ref 5–40)

## 2022-11-25 MED ORDER — NITROGLYCERIN 0.4 MG SL SUBL: 0.4000 mg | SUBLINGUAL_TABLET | SUBLINGUAL | 11 refills | Status: AC | PRN

## 2022-11-25 MED ORDER — METOPROLOL SUCCINATE ER 100 MG PO TB24: 100.0000 mg | ORAL_TABLET | Freq: Every day | ORAL | 3 refills | Status: DC

## 2022-11-25 MED ORDER — NITROGLYCERIN 0.4 MG SL SUBL: 0.4000 mg | SUBLINGUAL_TABLET | SUBLINGUAL | 11 refills | Status: DC | PRN

## 2022-11-25 NOTE — Addendum Note (Signed)
Addended by: Abdulla Pooley, Jonelle Sidle L on: 11/25/2022 12:37 PM   Modules accepted: Orders

## 2022-11-25 NOTE — Progress Notes (Signed)
Cardiology Office Note:    Date:  11/25/2022   ID:  Sean Joseph, DOB 01-21-1966, MRN [id number]  PCP:  Darrol Jump, PA-C  Cardiologist:  Jenean Lindau, MD   Referring MD: Darrol Jump, PA-C    ASSESSMENT:    1. Coronary artery disease involving native coronary artery of native heart without angina pectoris   2. Atherosclerosis of aorta ([place])   3. Essential hypertension   4. Hx of CABG   5. Mixed dyslipidemia   6. Tobacco abuse    PLAN:    In order of problems listed above:  Coronary artery disease: Secondary prevention stressed with the patient.  Importance of compliance with diet medication stressed any vocalized understanding.  He was advised to walk at least half an hour a day 5 days a week and he promises to do so. Essential hypertension: Blood pressure stable and lifestyle modification urged.  He has an element of whitecoat hypertension.  His blood pressures at home are fine. Mixed dyslipidemia: On lipid-lowering therapy.  Tolerating statin well.  He will have complete blood work today and we will advise him about diet increasing statin swelling likely.  Recent blood work revealed markedly elevated lipids. Cigarette smoker: I spent 5 minutes with the patient discussing solely about smoking. Smoking cessation was counseled. I suggested to the patient also different medications and pharmacological interventions. Patient is keen to try stopping on its own at this time. He will get back to me if he needs any further assistance in this matter. Patient will be seen in follow-up appointment in 9 months or earlier if the patient has any concerns    Medication Adjustments/Labs and Tests Ordered: Current medicines are reviewed at length with the patient today.  Concerns regarding medicines are outlined above.  No orders of the defined types were placed in this encounter.  No orders of the defined types were placed in this encounter.    No chief complaint on file.     History of Present Illness:    Sean Joseph is a 57 y.o. male.  Patient has past medical history of coronary artery disease post CABG surgery, COPD, essential hypertension, dyslipidemia unfortunately continues to smoke.  He is a full-time employed but does not exercise on a regular basis.  No chest pain orthopnea or PND.  His lipids are elevated from the doctor's office notes.  At the time of my evaluation, the patient is alert awake oriented and in no distress.  His wife accompanies him for his visit and is very supportive.  Past Medical History:  Diagnosis Date   Anginal pain ([place])    Atherosclerosis of aorta (HCC)    CAD (coronary artery disease) 04/29/2020   Chest pain 02/28/2020   COPD (chronic obstructive pulmonary disease) (HCC)    Coronary artery disease    Essential hypertension 07/23/2020   Generalized anxiety disorder    GERD (gastroesophageal reflux disease)    Hx of CABG 03/01/2020   Hypertension    Mixed dyslipidemia 07/23/2020   Mixed hyperlipidemia    Myocardial infarction (HCC)    Non-ST elevation (NSTEMI) myocardial infarction ([place])    Tobacco abuse     Past Surgical History:  Procedure Laterality Date   CORONARY ARTERY BYPASS GRAFT N/A 03/01/2020   Procedure: CORONARY ARTERY BYPASS GRAFTING (CABG) X 3, USING BILATERAL MAMMORY ARTERIES, RIGHT LEG GREATER SAPHENOUS VEIN HARVESTED.;  Surgeon: Melrose Nakayama, MD;  Location: [place];  Service: Open Heart Surgery;  Laterality: N/A;   HERNIA REPAIR  LEFT HEART CATH AND CORONARY ANGIOGRAPHY N/A 02/29/2020   Procedure: LEFT HEART CATH AND CORONARY ANGIOGRAPHY;  Surgeon: Belva Crome, MD;  Location: [place] CV LAB;  Service: Cardiovascular;  Laterality: N/A;   TEE WITHOUT CARDIOVERSION N/A 03/01/2020   Procedure: TRANSESOPHAGEAL ECHOCARDIOGRAM (TEE);  Surgeon: Melrose Nakayama, MD;  Location: [place];  Service: Open Heart Surgery;  Laterality: N/A;   VIDEO BRONCHOSCOPY WITH INSERTION OF INTERBRONCHIAL VALVE  (IBV) N/A 03/08/2020   Procedure: VIDEO BRONCHOSCOPY WITH INSERTION OF INTERBRONCHIAL VALVE (IBV);  Surgeon: Melrose Nakayama, MD;  Location: [facility] OR;  Service: Thoracic;  Laterality: N/A;   VIDEO BRONCHOSCOPY WITH INSERTION OF INTERBRONCHIAL VALVE (IBV) N/A 04/18/2020   Procedure: VIDEO BRONCHOSCOPY WITH REMOVAL OF INTERBRONCHIAL VALVE (IBV);  Surgeon: Melrose Nakayama, MD;  Location: [facility] OR;  Service: Thoracic;  Laterality: N/A;    Current Medications: Current Meds  Medication Sig   albuterol (PROVENTIL) (2.5 MG/3ML) 0.083% nebulizer solution Inhale 3 mLs into the lungs every 6 (six) hours as needed for wheezing or shortness of breath.   albuterol (VENTOLIN HFA) 108 (90 Base) MCG/ACT inhaler Inhale 1-2 puffs into the lungs every 4 (four) hours as needed for wheezing or shortness of breath.   aspirin 81 MG EC tablet Take 1 tablet (81 mg total) by mouth daily.   Fluticasone-Umeclidin-Vilant (TRELEGY ELLIPTA) 100-62.5-25 MCG/INH AEPB Inhale 1 puff into the lungs daily as needed for shortness of breath or wheezing (shortness of breat).   metoprolol succinate (TOPROL-XL) 100 MG 24 hr tablet Take 1 tablet (100 mg total) by mouth daily.   montelukast (SINGULAIR) 10 MG tablet TAKE 1 TABLET(10 MG) BY MOUTH AT BEDTIME   nitroGLYCERIN (NITROSTAT) 0.4 MG SL tablet Place 0.4 mg under the tongue every 5 (five) minutes as needed for chest pain.   rosuvastatin (CRESTOR) 10 MG tablet Take 10 mg by mouth daily.     Allergies:   Codeine and Icosapent ethyl   Social History   Socioeconomic History   Marital status: Married    Spouse name: Not on file   Number of children: 3   Years of education: Not on file   Highest education level: Not on file  Occupational History   Not on file  Tobacco Use   Smoking status: Every Day    Packs/day: 1.50    Years: 34.00    Total pack years: 51.00    Types: Cigarettes   Smokeless tobacco: Never  Vaping Use   Vaping Use: Never used  Substance and Sexual  Activity   Alcohol use: Yes    Comment: socially   Drug use: Never   Sexual activity: Not on file  Other Topics Concern   Not on file  Social History Narrative   Not on file   Social Determinants of Health   Financial Resource Strain: Not on file  Food Insecurity: Not on file  Transportation Needs: Not on file  Physical Activity: Not on file  Stress: Not on file  Social Connections: Not on file     Family History: The patient's family history includes CAD in his father and mother; Esophageal cancer in his father.  ROS:   Please see the history of present illness.    All other systems reviewed and are negative.  EKGs/Labs/Other Studies Reviewed:    The following studies were reviewed today: EKG revealed sinus rhythm and poor anterior forces   Recent Labs: No results found for requested labs within last 365 days.  Recent Lipid Panel  Component Value Date/Time   CHOL 148 03/04/2021 0820   TRIG 276 (H) 03/04/2021 0820   HDL 27 (L) 03/04/2021 0820   CHOLHDL 5.5 (H) 03/04/2021 0820   CHOLHDL 6.9 02/29/2020 0659   VLDL 46 (H) 02/29/2020 0659   LDLCALC 76 03/04/2021 0820    Physical Exam:    VS:  BP (!) 148/98   Pulse 91   Ht 5\' 7"  (1.702 m)   Wt 207 lb 6.4 oz (94.1 kg)   SpO2 92%   BMI 32.48 kg/m     Wt Readings from Last 3 Encounters:  11/25/22 207 lb 6.4 oz (94.1 kg)  02/10/22 214 lb 12.8 oz (97.4 kg)  07/15/21 214 lb (97.1 kg)     GEN: Patient is in no acute distress HEENT: Normal NECK: No JVD; No carotid bruits LYMPHATICS: No lymphadenopathy CARDIAC: Hear sounds regular, 2/6 systolic murmur at the apex. RESPIRATORY:  Clear to auscultation without rales, wheezing or rhonchi  ABDOMEN: Soft, non-tender, non-distended MUSCULOSKELETAL:  No edema; No deformity  SKIN: Warm and dry NEUROLOGIC:  Alert and oriented x 3 PSYCHIATRIC:  Normal affect   Signed, 07/17/21, MD  11/25/2022 8:52 AM    [facility]

## 2022-11-25 NOTE — Patient Instructions (Signed)
Medication Instructions:  Your physician recommends that you continue on your current medications as directed. Please refer to the Current Medication list given to you today.  *If you need a refill on your cardiac medications before your next appointment, please call your pharmacy*   Lab Work: Your physician recommends that you have a CMP and lipids done today in the office.  If you have labs (blood work) drawn today and your tests are completely normal, you will receive your results only by: [place] (if you have MyChart) OR A paper copy in the mail If you have any lab test that is abnormal or we need to change your treatment, we will call you to review the results.   Testing/Procedures: None ordered   Follow-Up: At [facility], you and your health needs are our priority.  As part of our continuing mission to provide you with exceptional heart care, we have created designated Provider Care Teams.  These Care Teams include your primary Cardiologist (physician) and Advanced Practice Providers (APPs -  Physician Assistants and Nurse Practitioners) who all work together to provide you with the care you need, when you need it.  We recommend signing up for the patient portal called "MyChart".  Sign up information is provided on this After Visit Summary.  MyChart is used to connect with patients for Virtual Visits (Telemedicine).  Patients are able to view lab/test results, encounter notes, upcoming appointments, etc.  Non-urgent messages can be sent to your provider as well.   To learn more about what you can do with MyChart, go to [email or web address].    Your next appointment:   9 month(s)  The format for your next appointment:   In Person  Provider:   Jyl Heinz, MD    Other Instructions none  Important Information About Sugar

## 2023-01-24 ENCOUNTER — Other Ambulatory Visit: Payer: Self-pay | Admitting: Cardiology

## 2023-02-19 NOTE — Addendum Note (Signed)
Addended by: Eleonore Chiquito on: 02/19/2023 08:11 AM   Modules accepted: Orders

## 2023-02-20 LAB — HEPATIC FUNCTION PANEL
ALT: 43 IU/L (ref 0–44)
AST: 39 IU/L (ref 0–40)
Albumin: 4.5 g/dL (ref 3.8–4.9)
Alkaline Phosphatase: 82 IU/L (ref 44–121)
Bilirubin Total: 0.3 mg/dL (ref 0.0–1.2)
Bilirubin, Direct: <0.1 mg/dL (ref 0.00–0.40)
Total Protein: 7.3 g/dL (ref 6.0–8.5)

## 2023-02-20 LAB — LIPID PANEL
Chol/HDL Ratio: 7.2 ratio — ABNORMAL HIGH (ref 0.0–5.0)
Cholesterol, Total: 209 mg/dL — ABNORMAL HIGH (ref 100–199)
HDL: 29 mg/dL — ABNORMAL LOW (ref >39)
LDL Chol Calc (NIH): 126 mg/dL — ABNORMAL HIGH (ref 0–99)
Triglycerides: 303 mg/dL — ABNORMAL HIGH (ref 0–149)
VLDL Cholesterol Cal: 54 mg/dL — ABNORMAL HIGH (ref 5–40)

## 2023-02-23 ENCOUNTER — Telehealth: Payer: Self-pay

## 2023-02-23 DIAGNOSIS — E782 Mixed hyperlipidemia: Secondary | ICD-10-CM

## 2023-02-23 DIAGNOSIS — I2510 Atherosclerotic heart disease of native coronary artery without angina pectoris: Secondary | ICD-10-CM

## 2023-02-23 MED ORDER — ROSUVASTATIN CALCIUM 20 MG PO TABS: ORAL_TABLET | ORAL | 3 refills | Status: DC

## 2023-02-23 NOTE — Telephone Encounter (Signed)
Spoke with Stanton Kidney per DPR.

## 2023-02-23 NOTE — Telephone Encounter (Signed)
-----   Message from Garwin Brothers, MD sent at 02/22/2023 11:19 AM EDT ----- Diet exercise rosuva 20mg  alt with 40mg  and LL 6wks. Cc pcp Garwin Brothers, MD 02/22/2023 11:19 AM

## 2023-04-07 ENCOUNTER — Telehealth: Payer: Self-pay

## 2023-04-07 DIAGNOSIS — E782 Mixed hyperlipidemia: Secondary | ICD-10-CM

## 2023-04-07 DIAGNOSIS — I2510 Atherosclerotic heart disease of native coronary artery without angina pectoris: Secondary | ICD-10-CM

## 2023-04-07 DIAGNOSIS — Z951 Presence of aortocoronary bypass graft: Secondary | ICD-10-CM

## 2023-04-07 LAB — LIPID PANEL
Chol/HDL Ratio: 6.2 ratio — ABNORMAL HIGH (ref 0.0–5.0)
Cholesterol, Total: 186 mg/dL (ref 100–199)
HDL: 30 mg/dL — ABNORMAL LOW (ref >39)
LDL Chol Calc (NIH): 112 mg/dL — ABNORMAL HIGH (ref 0–99)
Triglycerides: 253 mg/dL — ABNORMAL HIGH (ref 0–149)
VLDL Cholesterol Cal: 44 mg/dL — ABNORMAL HIGH (ref 5–40)

## 2023-04-07 LAB — HEPATIC FUNCTION PANEL
ALT: 32 IU/L (ref 0–44)
AST: 32 IU/L (ref 0–40)
Albumin: 4.5 g/dL (ref 3.8–4.9)
Alkaline Phosphatase: 71 IU/L (ref 44–121)
Bilirubin Total: 0.4 mg/dL (ref 0.0–1.2)
Bilirubin, Direct: 0.13 mg/dL (ref 0.00–0.40)
Total Protein: 7.2 g/dL (ref 6.0–8.5)

## 2023-04-07 MED ORDER — ROSUVASTATIN CALCIUM 40 MG PO TABS
40.0000 mg | ORAL_TABLET | Freq: Every day | ORAL | 3 refills | Status: DC
Start: 2023-04-07 — End: 2023-09-28

## 2023-04-07 NOTE — Telephone Encounter (Signed)
-----   Message from Garwin Brothers, MD sent at 04/07/2023  9:03 AM EDT ----- Double statin.  Diet to keep low in carbohydrates.  Exercise regularly.  Recheck in 3 months.  Copy primary care Garwin Brothers, MD 04/07/2023 9:03 AM

## 2023-09-27 NOTE — Progress Notes (Unsigned)
Cardiology Office Note:  .   Date:  09/28/2023  ID:  [name], DOB 1966-10-23, MRN [id number] PCP: Gus Height, PA  [facility] Providers Cardiologist:  Garwin Brothers, MD    History of Present Illness: .   Catherine Sama is a 57 y.o. male with a past medical tree of CAD s/p CABG x 3 in 2021, hypertension, COPD, GERD, tobacco abuse, dyslipidemia, mild bilateral carotid artery stenosis.  03/01/2020 CABG x 3 LIMA - LAD, RIMA - RCA, SVG - OM1 02/29/2020 left heart cath CTO of the mid LAD, up to 70% stenosis of the circumflex, up to 80% stenosis of RCA 02/29/2020 echo EF 55 to 60%, mild concentric LVH, grade 1 DD, no valvular abnormalities.  Most recently evaluated by Dr. Tomie China on 11/25/2022, stable from a cardiac perspective and advised he can follow-up in 9 months.  He presents today for follow-up of his CAD.  He offers no formal complaints, states very active at work.  On occasion he notices sternal pain at his sternotomy incision if he exerts himself too much. He does continue to smoke, but is actively working on stopping, down to 1/2 PPD. He denies chest pain, palpitations, dyspnea, pnd, orthopnea, n, v, dizziness, syncope, edema, weight gain, or early satiety.   ROS: Review of Systems  All other systems reviewed and are negative.    Studies Reviewed: .        Cardiac Studies & Procedures   CARDIAC CATHETERIZATION  CARDIAC CATHETERIZATION 02/29/2020  Narrative  Apical severe hypokinesis.  EF 40%.  LVEDP 10 mmHg.  Normal left main  Anatomically small LAD that stopped short of the left ventricular apex.  The mid segment is totally occluded after the origin of a large septal perforator.  Relatively long segment of total occlusion.  A small to moderate sized LAD fills by left to left collaterals.  Codominant circumflex with first obtuse marginal containing 60 and 70% stenosis proximal and distal.  RCA is codominant.  Proximal segmental 80% stenosis mid 60%  stenosis and towards the distal portion of the mid segment there is a 95% stenosis.  The PDA runs the entire length of the posterior interventricular groove and wraps around the left ventricular apex.  RECOMMENDATIONS:   Debate treatment strategy. A potential option is extensive stenting in the proximal to distal RCA and treat LAD and obtuse marginal disease with medical therapy.  An alternative approach would be to consider arterial grafting of LAD if technically possible as well as arterial grafting of the right coronary with medical therapy of the obtuse marginal.  Given the patient's young age, it would be worth having surgical consultation prior to considering intervention.  The interventional team will need to weigh in relative to final treatment decision.  Findings Coronary Findings Diagnostic  Dominance: Co-dominant  Left Anterior Descending Vessel is small. Mid LAD lesion is 100% stenosed.  First Diagonal Branch Vessel is small in size.  First Septal Branch Vessel is large in size.  Second Diagonal Branch Vessel is small in size.  Left Circumflex Ost Cx lesion is 35% stenosed.  First Obtuse Marginal Branch Vessel is small in size. 1st Mrg-1 lesion is 50% stenosed. 1st Mrg-2 lesion is 75% stenosed.  First Left Posterolateral Branch Vessel is small in size.  Second Left Posterolateral Branch Vessel is small in size.  Right Coronary Artery There is mild diffuse disease throughout the vessel. Ost RCA to Prox RCA lesion is 80% stenosed. Prox RCA to Mid RCA lesion is  65% stenosed. Mid RCA lesion is 95% stenosed.  Intervention  No interventions have been documented.     ECHOCARDIOGRAM  ECHOCARDIOGRAM COMPLETE 02/29/2020  Narrative ECHOCARDIOGRAM REPORT    Patient Name:   Santino Tutterow Date of Exam: 02/29/2020 Medical Rec #:  [id number]       Height:       67.0 in Accession #:    [id number]      Weight:       201.5 lb Date of Birth:  1966/02/01       BSA:           2.029 m Patient Age:    53 years        BP:           125/85 mmHg Patient Gender: M               HR:           68 bpm. Exam Location:  Inpatient  Procedure: 2D Echo  Indications:    acute coronary syndrome  History:        Patient has no prior history of Echocardiogram examinations. COPD, Signs/Symptoms:Chest Pain; Risk Factors:Current Smoker.  Sonographer:    Delcie Roch Referring Phys: [phone] ADAM S BARNETT  IMPRESSIONS   1. Left ventricular ejection fraction, by estimation, is 55 to 60%. The left ventricle has normal function. The left ventricle has no regional wall motion abnormalities. There is mild concentric left ventricular hypertrophy. Left ventricular diastolic parameters are consistent with Grade I diastolic dysfunction (impaired relaxation). 2. Right ventricular systolic function is normal. The right ventricular size is normal. 3. The mitral valve is normal in structure. No evidence of mitral valve regurgitation. No evidence of mitral stenosis. 4. The aortic valve is normal in structure. Aortic valve regurgitation is not visualized. No aortic stenosis is present. 5. The inferior vena cava is normal in size with greater than 50% respiratory variability, suggesting right atrial pressure of 3 mmHg.  FINDINGS Left Ventricle: Left ventricular ejection fraction, by estimation, is 55 to 60%. The left ventricle has normal function. The left ventricle has no regional wall motion abnormalities. The left ventricular internal cavity size was normal in size. There is mild concentric left ventricular hypertrophy. Left ventricular diastolic parameters are consistent with Grade I diastolic dysfunction (impaired relaxation). Normal left ventricular filling pressure.  Right Ventricle: The right ventricular size is normal. No increase in right ventricular wall thickness. Right ventricular systolic function is normal.  Left Atrium: Left atrial size was normal in size.  Right  Atrium: Right atrial size was normal in size.  Pericardium: There is no evidence of pericardial effusion.  Mitral Valve: The mitral valve is normal in structure. Normal mobility of the mitral valve leaflets. No evidence of mitral valve regurgitation. No evidence of mitral valve stenosis.  Tricuspid Valve: The tricuspid valve is normal in structure. Tricuspid valve regurgitation is not demonstrated. No evidence of tricuspid stenosis.  Aortic Valve: The aortic valve is normal in structure. Aortic valve regurgitation is not visualized. No aortic stenosis is present.  Pulmonic Valve: The pulmonic valve was normal in structure. Pulmonic valve regurgitation is not visualized. No evidence of pulmonic stenosis.  Aorta: The aortic root is normal in size and structure.  Venous: The inferior vena cava is normal in size with greater than 50% respiratory variability, suggesting right atrial pressure of 3 mmHg.  IAS/Shunts: No atrial level shunt detected by color flow Doppler.   LEFT VENTRICLE PLAX 2D LVIDd:  4.50 cm     Diastology LVIDs:         2.90 cm     LV e' lateral:   6.96 cm/s LV PW:         1.10 cm     LV E/e' lateral: 6.7 LV IVS:        1.10 cm     LV e' medial:    6.74 cm/s LV E/e' medial:  6.9  LV Volumes (MOD) LV vol d, MOD A4C: 88.2 ml LV vol s, MOD A4C: 43.9 ml LV SV MOD A4C:     88.2 ml  RIGHT VENTRICLE RV S prime:     9.68 cm/s TAPSE (M-mode): 2.1 cm  LEFT ATRIUM             Index LA diam:        3.60 cm 1.77 cm/m LA Vol (A2C):   34.4 ml 16.96 ml/m LA Vol (A4C):   43.9 ml 21.64 ml/m LA Biplane Vol: 41.8 ml 20.60 ml/m AORTIC VALVE LVOT Vmax:   122.00 cm/s LVOT Vmean:  71.300 cm/s LVOT VTI:    0.233 m  AORTA Ao Root diam: 3.00 cm  MITRAL VALVE MV Area (PHT): 2.83 cm    SHUNTS MV Decel Time: 268 msec    Systemic VTI: 0.23 m MV E velocity: 46.60 cm/s MV A velocity: 81.00 cm/s MV E/A ratio:  0.58  Tobias Alexander MD Electronically signed by Tobias Alexander MD Signature Date/Time: 02/29/2020/8:19:43 PM    Final   TEE  ECHO INTRAOPERATIVE TEE 03/01/2020  Narrative *INTRAOPERATIVE TRANSESOPHAGEAL REPORT *    Patient Name:   Kito Mcelvain Date of Exam: 03/01/2020 Medical Rec #:  [id number]       Height: Accession #:    [id number]      Weight: Date of Birth:  January 11, 1966       BSA: Patient Age:    53 years        BP:           139/87 mmHg Patient Gender: M               HR:           69 bpm. Exam Location:  Inpatient  Transesophogeal exam was perform intraoperatively during surgical procedure. Patient was closely monitored under general anesthesia during the entirety of examination.  Indications:     coronary artery bypass surgery Performing Phys: Karna Christmas MD Diagnosing Phys: Karna Christmas MD  Complications: No known complications during this procedure. POST-OP IMPRESSIONS - Left Ventricle: The left ventricle is unchanged from pre-bypass. - Aorta: The aorta appears unchanged from pre-bypass. - Aortic Valve: The aortic valve appears unchanged from pre-bypass. - Mitral Valve: The mitral valve appears unchanged from pre-bypass. - Tricuspid Valve: There is trivial regurgitation. - Interatrial Septum: The interatrial septum appears unchanged from pre-bypass. - Pericardium: The pericardium appears unchanged from pre-bypass. - Comments: Pulmonic regurgitation absent.  PRE-OP FINDINGS Left Ventricle: The left ventricle has low normal systolic function, with an ejection fraction of 50-55%. The cavity size was normal. There is mildly increased left ventricular wall thickness.  Right Ventricle: The right ventricle has normal systolic function. The cavity was normal. There is no increase in right ventricular wall thickness.  Left Atrium: Left atrial size was normal in size.  Right Atrium: Right atrial size was normal in size.  Interatrial Septum: No atrial level shunt detected by color flow Doppler.  Pericardium: There is  no evidence of pericardial effusion.  Mitral Valve: The mitral valve is normal in structure. Mitral valve regurgitation is not visualized by color flow Doppler.  Tricuspid Valve: The tricuspid valve was normal in structure. Tricuspid valve regurgitation was not visualized by color flow Doppler.  Aortic Valve: The aortic valve is normal in structure. Aortic valve regurgitation was not visualized by color flow Doppler. There is no evidence of aortic valve stenosis.  Pulmonic Valve: The pulmonic valve was normal in structure. Pulmonic valve regurgitation is trivial by color flow Doppler.   Aorta: The aortic root, ascending aorta and aortic arch are normal in size and structure. There is evidence of plaque in the descending aorta; Grade I, measuring 1-26mm in size.   Karna Christmas MD Electronically signed by Karna Christmas MD Signature Date/Time: 03/01/2020/4:52:35 PM    Final            Risk Assessment/Calculations:             Physical Exam:   VS:  BP 120/80 (BP Location: Left Arm, Patient Position: Sitting, Cuff Size: Normal)   Pulse 68   Ht 5\' 7"  (1.702 m)   Wt 204 lb (92.5 kg)   BMI 31.95 kg/m    Wt Readings from Last 3 Encounters:  09/28/23 204 lb (92.5 kg)  11/25/22 207 lb 6.4 oz (94.1 kg)  02/10/22 214 lb 12.8 oz (97.4 kg)    GEN: Well nourished, well developed in no acute distress NECK: No JVD; No carotid bruits CARDIAC: RRR, no murmurs, rubs, gallops RESPIRATORY:  Clear to auscultation without rales, wheezing or rhonchi  ABDOMEN: Soft, non-tender, non-distended EXTREMITIES:  No edema; No deformity   ASSESSMENT AND PLAN: .   CAD-s/p CABG x 3 in 2021, Stable with no anginal symptoms. No indication for ischemic evaluation.  Continue aspirin 81 mg daily, continue metoprolol 100 mg daily, continue nitroglycerin as needed-has not needed, continue Zetia and fenofibrate--he has not been able to tolerate statin therapy.   Dyslipidemia-was previously on Crestor, as well as  another statin that he cannot recall and stated that they both cause significant myalgias, most recent LDL is elevated at 114 on 05/24/2955, it appears he was started on Zetia and fenofibrate.  We will check FLP and LFTs today.  If his cholesterol is not well-controlled, we discussed referral to Pharm.D. for PCSK9 inhibitor consideration.  Tobacco abuse-currently smoking half pack per day, he has cut down significantly and motivated for total cessation.  Offered pharmacological assistance however he declines, thinks he can stop on his own.       Dispo: CMET, CBC, FLP.  Return in 1 year  Signed, Flossie Dibble, NP

## 2023-09-28 ENCOUNTER — Encounter: Payer: Self-pay | Admitting: Cardiology

## 2023-09-28 ENCOUNTER — Ambulatory Visit: Payer: PRIVATE HEALTH INSURANCE | Attending: Cardiology | Admitting: Cardiology

## 2023-09-28 VITALS — BP 120/80 | HR 68 | Ht 67.0 in | Wt 204.0 lb

## 2023-09-28 DIAGNOSIS — I10 Essential (primary) hypertension: Secondary | ICD-10-CM | POA: Diagnosis not present

## 2023-09-28 DIAGNOSIS — E782 Mixed hyperlipidemia: Secondary | ICD-10-CM | POA: Diagnosis not present

## 2023-09-28 DIAGNOSIS — Z951 Presence of aortocoronary bypass graft: Secondary | ICD-10-CM

## 2023-09-28 DIAGNOSIS — I2510 Atherosclerotic heart disease of native coronary artery without angina pectoris: Secondary | ICD-10-CM | POA: Diagnosis not present

## 2023-09-28 DIAGNOSIS — Z79899 Other long term (current) drug therapy: Secondary | ICD-10-CM

## 2023-09-28 NOTE — Patient Instructions (Signed)
Medication Instructions:  Your physician recommends that you continue on your current medications as directed. Please refer to the Current Medication list given to you today.  *If you need a refill on your cardiac medications before your next appointment, please call your pharmacy*   Lab Work: Your physician recommends that you return for lab work in: Today for CMP, CBC and Lipid Panel  If you have labs (blood work) drawn today and your tests are completely normal, you will receive your results only by: MyChart Message (if you have MyChart) OR A paper copy in the mail If you have any lab test that is abnormal or we need to change your treatment, we will call you to review the results.   Testing/Procedures: NONE   Follow-Up: At [facility], you and your health needs are our priority.  As part of our continuing mission to provide you with exceptional heart care, we have created designated Provider Care Teams.  These Care Teams include your primary Cardiologist (physician) and Advanced Practice Providers (APPs -  Physician Assistants and Nurse Practitioners) who all work together to provide you with the care you need, when you need it.  We recommend signing up for the patient portal called "MyChart".  Sign up information is provided on this After Visit Summary.  MyChart is used to connect with patients for Virtual Visits (Telemedicine).  Patients are able to view lab/test results, encounter notes, upcoming appointments, etc.  Non-urgent messages can be sent to your provider as well.   To learn more about what you can do with MyChart, go to [email or web address].    Your next appointment:   1 year(s)  Provider:   Belva Crome, MD    Other Instructions

## 2023-09-29 LAB — CBC WITH DIFFERENTIAL/PLATELET
Basophils Absolute: 0.1 x10E3/uL (ref 0.0–0.2)
Basos: 1 %
EOS (ABSOLUTE): 0.2 x10E3/uL (ref 0.0–0.4)
Eos: 1 %
Hematocrit: 52.6 % — ABNORMAL HIGH (ref 37.5–51.0)
Hemoglobin: 17.8 g/dL — ABNORMAL HIGH (ref 13.0–17.7)
Immature Grans (Abs): 0.1 x10E3/uL (ref 0.0–0.1)
Immature Granulocytes: 1 %
Lymphocytes Absolute: 3.4 x10E3/uL — ABNORMAL HIGH (ref 0.7–3.1)
Lymphs: 26 %
MCH: 32.3 pg (ref 26.6–33.0)
MCHC: 33.8 g/dL (ref 31.5–35.7)
MCV: 96 fL (ref 79–97)
Monocytes Absolute: 0.8 x10E3/uL (ref 0.1–0.9)
Monocytes: 6 %
Neutrophils Absolute: 8.4 x10E3/uL — ABNORMAL HIGH (ref 1.4–7.0)
Neutrophils: 65 %
Platelets: 245 x10E3/uL (ref 150–450)
RBC: 5.51 x10E6/uL (ref 4.14–5.80)
RDW: 13 % (ref 11.6–15.4)
WBC: 12.8 x10E3/uL — ABNORMAL HIGH (ref 3.4–10.8)

## 2023-09-29 LAB — COMPREHENSIVE METABOLIC PANEL WITH GFR
ALT: 37 IU/L (ref 0–44)
AST: 35 IU/L (ref 0–40)
Albumin: 4.5 g/dL (ref 3.8–4.9)
Alkaline Phosphatase: 60 IU/L (ref 44–121)
BUN/Creatinine Ratio: 14 (ref 9–20)
BUN: 16 mg/dL (ref 6–24)
Bilirubin Total: 0.4 mg/dL (ref 0.0–1.2)
CO2: 24 mmol/L (ref 20–29)
Calcium: 10 mg/dL (ref 8.7–10.2)
Chloride: 102 mmol/L (ref 96–106)
Creatinine, Ser: 1.15 mg/dL (ref 0.76–1.27)
Globulin, Total: 3.1 g/dL (ref 1.5–4.5)
Glucose: 91 mg/dL (ref 70–99)
Potassium: 4.7 mmol/L (ref 3.5–5.2)
Sodium: 140 mmol/L (ref 134–144)
Total Protein: 7.6 g/dL (ref 6.0–8.5)
eGFR: 74 mL/min/1.73

## 2023-09-29 LAB — LIPID PANEL
Chol/HDL Ratio: 6.3 ratio — ABNORMAL HIGH (ref 0.0–5.0)
Cholesterol, Total: 182 mg/dL (ref 100–199)
HDL: 29 mg/dL — ABNORMAL LOW
LDL Chol Calc (NIH): 105 mg/dL — ABNORMAL HIGH (ref 0–99)
Triglycerides: 280 mg/dL — ABNORMAL HIGH (ref 0–149)
VLDL Cholesterol Cal: 48 mg/dL — ABNORMAL HIGH (ref 5–40)

## 2023-09-30 ENCOUNTER — Telehealth: Payer: Self-pay

## 2023-09-30 NOTE — Telephone Encounter (Signed)
Pt viewed results in My Chart per Wallis Bamberg NP Notes. Routed to PCP.

## 2023-11-13 ENCOUNTER — Other Ambulatory Visit: Payer: Self-pay | Admitting: Cardiology
